# Patient Record
Sex: Female | Born: 1955 | Race: White | Hispanic: No | Marital: Married | Smoking: Never smoker
Health system: Southern US, Community
[De-identification: ages and names within clinical notes are randomized; demographics above are authoritative.]

## PROBLEM LIST (undated history)

## (undated) DIAGNOSIS — M8580 Other specified disorders of bone density and structure, unspecified site: Secondary | ICD-10-CM

## (undated) DIAGNOSIS — F419 Anxiety disorder, unspecified: Secondary | ICD-10-CM

## (undated) DIAGNOSIS — I10 Essential (primary) hypertension: Secondary | ICD-10-CM

## (undated) DIAGNOSIS — I447 Left bundle-branch block, unspecified: Secondary | ICD-10-CM

## (undated) DIAGNOSIS — E785 Hyperlipidemia, unspecified: Secondary | ICD-10-CM

## (undated) DIAGNOSIS — H269 Unspecified cataract: Secondary | ICD-10-CM

## (undated) DIAGNOSIS — N8500 Endometrial hyperplasia, unspecified: Secondary | ICD-10-CM

## (undated) DIAGNOSIS — K219 Gastro-esophageal reflux disease without esophagitis: Secondary | ICD-10-CM

## (undated) DIAGNOSIS — M1990 Unspecified osteoarthritis, unspecified site: Secondary | ICD-10-CM

## (undated) DIAGNOSIS — F32A Depression, unspecified: Secondary | ICD-10-CM

## (undated) DIAGNOSIS — C439 Malignant melanoma of skin, unspecified: Secondary | ICD-10-CM

## (undated) HISTORY — DX: Left bundle-branch block, unspecified: I44.7

## (undated) HISTORY — DX: Other specified disorders of bone density and structure, unspecified site: M85.80

## (undated) HISTORY — DX: Endometrial hyperplasia, unspecified: N85.00

## (undated) HISTORY — DX: Unspecified cataract: H26.9

## (undated) HISTORY — DX: Gastro-esophageal reflux disease without esophagitis: K21.9

## (undated) HISTORY — DX: Malignant melanoma of skin, unspecified: C43.9

## (undated) HISTORY — PX: MELANOMA EXCISION: SHX5266

## (undated) HISTORY — PX: COLONOSCOPY: SHX174

## (undated) HISTORY — PX: PARTIAL HYSTERECTOMY: SHX80

## (undated) HISTORY — DX: Depression, unspecified: F32.A

## (undated) HISTORY — DX: Anxiety disorder, unspecified: F41.9

## (undated) HISTORY — PX: POLYPECTOMY: SHX149

## (undated) HISTORY — PX: TONSILLECTOMY: SUR1361

## (undated) HISTORY — DX: Essential (primary) hypertension: I10

## (undated) HISTORY — DX: Unspecified osteoarthritis, unspecified site: M19.90

## (undated) HISTORY — DX: Hyperlipidemia, unspecified: E78.5

---

## 2002-07-11 ENCOUNTER — Emergency Department (HOSPITAL_COMMUNITY): Admission: EM | Admit: 2002-07-11 | Discharge: 2002-07-11 | Payer: Self-pay | Admitting: Emergency Medicine

## 2003-07-04 ENCOUNTER — Other Ambulatory Visit: Admission: RE | Admit: 2003-07-04 | Discharge: 2003-07-04 | Payer: Self-pay | Admitting: Obstetrics and Gynecology

## 2005-07-29 ENCOUNTER — Ambulatory Visit (HOSPITAL_COMMUNITY): Admission: RE | Admit: 2005-07-29 | Discharge: 2005-07-29 | Payer: Self-pay | Admitting: Internal Medicine

## 2005-07-29 LAB — HM DEXA SCAN

## 2010-10-21 DIAGNOSIS — C439 Malignant melanoma of skin, unspecified: Secondary | ICD-10-CM

## 2010-10-21 HISTORY — DX: Malignant melanoma of skin, unspecified: C43.9

## 2012-10-23 ENCOUNTER — Encounter: Payer: Self-pay | Admitting: Gastroenterology

## 2013-07-28 LAB — HM MAMMOGRAPHY: HM Mammogram: NEGATIVE

## 2013-08-30 ENCOUNTER — Encounter: Payer: Self-pay | Admitting: Internal Medicine

## 2013-08-31 ENCOUNTER — Ambulatory Visit: Payer: Private Health Insurance - Indemnity | Admitting: Emergency Medicine

## 2013-08-31 ENCOUNTER — Encounter: Payer: Self-pay | Admitting: Emergency Medicine

## 2013-08-31 VITALS — BP 124/88 | HR 62 | Temp 98.2°F | Resp 18 | Ht 64.5 in | Wt 158.0 lb

## 2013-08-31 DIAGNOSIS — Z0000 Encounter for general adult medical examination without abnormal findings: Secondary | ICD-10-CM

## 2013-08-31 DIAGNOSIS — R0602 Shortness of breath: Secondary | ICD-10-CM

## 2013-08-31 DIAGNOSIS — M549 Dorsalgia, unspecified: Secondary | ICD-10-CM

## 2013-08-31 DIAGNOSIS — F411 Generalized anxiety disorder: Secondary | ICD-10-CM

## 2013-08-31 DIAGNOSIS — I10 Essential (primary) hypertension: Secondary | ICD-10-CM

## 2013-08-31 LAB — LIPID PANEL
Cholesterol: 180 mg/dL (ref 0–200)
HDL: 83 mg/dL (ref >39)
LDL Cholesterol: 84 mg/dL (ref 0–99)
Total CHOL/HDL Ratio: 2.2 Ratio
Triglycerides: 63 mg/dL (ref <150)
VLDL: 13 mg/dL (ref 0–40)

## 2013-08-31 LAB — CBC WITH DIFFERENTIAL/PLATELET
Basophils Absolute: 0 10*3/uL (ref 0.0–0.1)
Basophils Relative: 0 % (ref 0–1)
Eosinophils Absolute: 0.1 10*3/uL (ref 0.0–0.7)
Eosinophils Relative: 2 % (ref 0–5)
HCT: 46.5 % — ABNORMAL HIGH (ref 36.0–46.0)
Hemoglobin: 16.1 g/dL — ABNORMAL HIGH (ref 12.0–15.0)
Lymphocytes Relative: 32 % (ref 12–46)
Lymphs Abs: 1.9 10*3/uL (ref 0.7–4.0)
MCH: 30 pg (ref 26.0–34.0)
MCHC: 34.6 g/dL (ref 30.0–36.0)
MCV: 86.8 fL (ref 78.0–100.0)
Monocytes Absolute: 0.5 10*3/uL (ref 0.1–1.0)
Monocytes Relative: 8 % (ref 3–12)
Neutro Abs: 3.4 10*3/uL (ref 1.7–7.7)
Neutrophils Relative %: 58 % (ref 43–77)
Platelets: 320 10*3/uL (ref 150–400)
RBC: 5.36 MIL/uL — ABNORMAL HIGH (ref 3.87–5.11)
RDW: 13.2 % (ref 11.5–15.5)
WBC: 5.9 10*3/uL (ref 4.0–10.5)

## 2013-08-31 LAB — HEPATIC FUNCTION PANEL
ALT: 22 U/L (ref 0–35)
AST: 18 U/L (ref 0–37)
Albumin: 4.2 g/dL (ref 3.5–5.2)
Alkaline Phosphatase: 75 U/L (ref 39–117)
Bilirubin, Direct: 0.1 mg/dL (ref 0.0–0.3)
Indirect Bilirubin: 0.5 mg/dL (ref 0.0–0.9)
Total Bilirubin: 0.6 mg/dL (ref 0.3–1.2)
Total Protein: 6.8 g/dL (ref 6.0–8.3)

## 2013-08-31 LAB — HEMOGLOBIN A1C
Hgb A1c MFr Bld: 5.4 % (ref <5.7)
Mean Plasma Glucose: 108 mg/dL (ref <117)

## 2013-08-31 LAB — BASIC METABOLIC PANEL WITH GFR
BUN: 13 mg/dL (ref 6–23)
CO2: 31 mEq/L (ref 19–32)
Calcium: 9.7 mg/dL (ref 8.4–10.5)
Chloride: 105 mEq/L (ref 96–112)
Creat: 0.69 mg/dL (ref 0.50–1.10)
GFR, Est African American: >89 mL/min
GFR, Est Non African American: >89 mL/min
Glucose, Bld: 88 mg/dL (ref 70–99)
Potassium: 3.9 mEq/L (ref 3.5–5.3)
Sodium: 141 mEq/L (ref 135–145)

## 2013-08-31 LAB — MAGNESIUM: Magnesium: 2.1 mg/dL (ref 1.5–2.5)

## 2013-08-31 LAB — TSH: TSH: 0.965 u[IU]/mL (ref 0.350–4.500)

## 2013-08-31 MED ORDER — IBUPROFEN 800 MG PO TABS: 800.0000 mg | ORAL_TABLET | Freq: Three times a day (TID) | ORAL | Status: DC | PRN

## 2013-08-31 MED ORDER — CYCLOBENZAPRINE HCL 5 MG PO TABS: 5.0000 mg | ORAL_TABLET | Freq: Three times a day (TID) | ORAL | Status: DC | PRN

## 2013-08-31 NOTE — Patient Instructions (Signed)
Shoulder Pain The shoulder is the joint that connects your arm to your body. Muscles and band-like tissues that connect bones to muscles (tendons) hold the joint together. Shoulder pain is felt if an injury or medical problem affects one or more parts of the shoulder. HOME CARE   Put ice on the sore area.  Put ice in a plastic bag.  Place a towel between your skin and the bag.  Leave the ice on for 15-20 minutes, 03-04 times a day for the first 2 days.  Stop using cold packs if they do not help with the pain.  If you were given something to keep your shoulder from moving (sling, shoulder immobilizer), wear it as told. Only take it off to shower or bathe.  Move your arm as little as possible, but keep your hand moving to prevent puffiness (swelling).  Squeeze a soft ball or foam pad as much as possible to help prevent swelling.  Take medicine as told by your doctor. GET HELP RIGHT AWAY IF:   Your arm, hand, or fingers are numb or tingling.  Your arm, hand, or fingers are puffy (swollen), painful, or turn white or blue.  You have more pain.  You have progressing new pain in your arm, hand, or fingers.  Your hand or fingers get cold.  Your medicine does not help lessen your pain. MAKE SURE YOU:   Understand these instructions.  Will watch your condition.  Will get help right away if you are not doing well or get worse. Document Released: 03/25/2008 Document Revised: 07/01/2012 Document Reviewed: 04/20/2012 [facility] Patient Information 2014 [place], [place]. Back Pain, Adult Back pain is very common. The pain often gets better over time. The cause of back pain is usually not dangerous. Most people can learn to manage their back pain on their own.  HOME CARE   Stay active. Start with short walks on flat ground if you can. Try to walk farther each day.  Do not sit, drive, or stand in one place for more than 30 minutes. Do not stay in bed.  Do not avoid exercise or work.  Activity can help your back heal faster.  Be careful when you bend or lift an object. Bend at your knees, keep the object close to you, and do not twist.  Sleep on a firm mattress. Lie on your side, and bend your knees. If you lie on your back, put a pillow under your knees.  Only take medicines as told by your doctor.  Put ice on the injured area.  Put ice in a plastic bag.  Place a towel between your skin and the bag.  Leave the ice on for 15-20 minutes, 03-04 times a day for the first 2 to 3 days. After that, you can switch between ice and heat packs.  Ask your doctor about back exercises or massage.  Avoid feeling anxious or stressed. Find good ways to deal with stress, such as exercise. GET HELP RIGHT AWAY IF:   Your pain does not go away with rest or medicine.  Your pain does not go away in 1 week.  You have new problems.  You do not feel well.  The pain spreads into your legs.  You cannot control when you poop (bowel movement) or pee (urinate).  Your arms or legs feel weak or lose feeling (numbness).  You feel sick to your stomach (nauseous) or throw up (vomit).  You have belly (abdominal) pain.  You feel like you  may pass out (faint). MAKE SURE YOU:   Understand these instructions.  Will watch your condition.  Will get help right away if you are not doing well or get worse. Document Released: 03/25/2008 Document Revised: 12/30/2011 Document Reviewed: 02/25/2011 [facility] Patient Information 2014 [place], [place].

## 2013-09-01 ENCOUNTER — Ambulatory Visit (HOSPITAL_COMMUNITY)
Admission: RE | Admit: 2013-09-01 | Discharge: 2013-09-01 | Disposition: A | Discharge status: Home / Self Care | Payer: Private Health Insurance - Indemnity | Source: Ambulatory Visit | Attending: Emergency Medicine | Admitting: Emergency Medicine

## 2013-09-01 DIAGNOSIS — M5144 Schmorl's nodes, thoracic region: Secondary | ICD-10-CM | POA: Insufficient documentation

## 2013-09-01 DIAGNOSIS — I10 Essential (primary) hypertension: Secondary | ICD-10-CM | POA: Insufficient documentation

## 2013-09-01 DIAGNOSIS — M545 Low back pain, unspecified: Secondary | ICD-10-CM | POA: Insufficient documentation

## 2013-09-01 DIAGNOSIS — R0602 Shortness of breath: Secondary | ICD-10-CM

## 2013-09-01 DIAGNOSIS — M549 Dorsalgia, unspecified: Secondary | ICD-10-CM

## 2013-09-01 DIAGNOSIS — R0989 Other specified symptoms and signs involving the circulatory and respiratory systems: Secondary | ICD-10-CM | POA: Insufficient documentation

## 2013-09-01 DIAGNOSIS — R0609 Other forms of dyspnea: Secondary | ICD-10-CM | POA: Insufficient documentation

## 2013-09-01 LAB — URINALYSIS, ROUTINE W REFLEX MICROSCOPIC
Bilirubin Urine: NEGATIVE
Glucose, UA: NEGATIVE mg/dL
Hgb urine dipstick: NEGATIVE
Ketones, ur: NEGATIVE mg/dL
Leukocytes, UA: NEGATIVE
Nitrite: NEGATIVE
Protein, ur: NEGATIVE mg/dL
Specific Gravity, Urine: 1.023 (ref 1.005–1.030)
Urobilinogen, UA: 0.2 mg/dL (ref 0.0–1.0)
pH: 5 (ref 5.0–8.0)

## 2013-09-01 LAB — MICROALBUMIN / CREATININE URINE RATIO
Creatinine, Urine: 213.3 mg/dL
Microalb Creat Ratio: 4.7 mg/g (ref 0.0–30.0)
Microalb, Ur: 1 mg/dL (ref 0.00–1.89)

## 2013-09-01 LAB — VITAMIN D 25 HYDROXY (VIT D DEFICIENCY, FRACTURES): Vit D, 25-Hydroxy: 51 ng/mL (ref 30–89)

## 2013-09-01 LAB — INSULIN, FASTING: Insulin fasting, serum: 11 u[IU]/mL (ref 3–28)

## 2013-09-01 NOTE — Addendum Note (Signed)
Addended by: Loree Fee R on: 09/01/2013 05:07 AM   Modules accepted: Orders

## 2013-09-01 NOTE — Progress Notes (Signed)
Subjective:    Patient ID: Maureen Henderson, female    DOB: [birth year], 57 y.o.   MRN: [id number]  HPI Comments: 57 yo female presents for yearly CPE with multiple complaints. She has not been exercising as much due to a back strain while doing water aerobics. She notes it has improved somewhat with rest and occasional Ibuprofen but still seems to get stiff and sore if sits for too long. Denies radiation of pain, notes pain on both sides of lower back. She has also noticed mild increased difficult with breathing when she is in chest high water, denies SOB any other time. She has been able to d/c Wellbutrin and maintain a low level of anxiety, she continues to see counselor regularly. She did not f/u AD for overdue colonoscopy but is willing to schedule in the spring. She did see DERM last year and they are continuing to monitor irregular moles. LAST LABS T 194 TG 85 H 75 LDL 102 D 42 A1C 5.4 MAG 2.3 TSH NL She notes BP has been good at home, she denies CP, Palpitations, edema, dizziness, SI, HI    Current Outpatient Prescriptions on File Prior to Visit  Medication Sig Dispense Refill  . cholecalciferol (VITAMIN D) 1000 UNITS tablet Take 1,000 Units by mouth every other day. Take two tablets daily.      . folic acid (FOLVITE) 400 MCG tablet Take 400 mcg by mouth daily.      . Multiple Vitamin (MULTIVITAMIN) tablet Take 1 tablet by mouth daily.      . propranolol (INDERAL) 10 MG tablet Take 10 mg by mouth daily.      . Red Yeast Rice Extract 600 MG TABS Take 600 mg by mouth. sporatic       No current facility-administered medications on file prior to visit.   ALLERGIES Doxycycline  Past Medical History  Diagnosis Date  . Hypertension   . Hyperlipidemia   . Anxiety    Past Surgical History  Procedure Laterality Date  . Partial hysterectomy  30    uterine polyps  . Tonsillectomy    . Melanoma excision      chest   SOCIAL HX - Tobacco or drug use.  + Wine 1 glass qd  Family History   Problem Relation Age of Onset  . Hypertension Mother   . Cancer Father   . Cancer Maternal Grandmother   . Heart disease Paternal Grandmother   . Stroke Paternal Grandmother   . Cancer Paternal Grandfather       Review of Systems  Musculoskeletal: Positive for back pain.  All other systems reviewed and are negative.   BP 124/88  Pulse 62  Temp(Src) 98.2 F (36.8 C) (Temporal)  Resp 18  Ht 5' 4.5" (1.638 m)  Wt 158 lb (71.668 kg)  BMI 26.71 kg/m2     Objective:   Physical Exam  Nursing note and vitals reviewed. Constitutional: She is oriented to person, place, and time. She appears well-developed and well-nourished.  Endocrine- Balan  HENT:  Head: Normocephalic and atraumatic.  Right Ear: External ear normal.  Left Ear: External ear normal.  Nose: Nose normal.  Mouth/Throat: Oropharynx is clear and moist.  Eyes: Conjunctivae and EOM are normal. Pupils are equal, round, and reactive to light.  Dr Azucena Freed yearly eval  Neck: Normal range of motion. Neck supple. No JVD present. No tracheal deviation present. No thyromegaly present.  Cardiovascular: Normal rate, regular rhythm, normal heart sounds and intact distal pulses.  Dr. Jacinto Halim  Pulmonary/Chest: Effort normal and breath sounds normal. She has no wheezes. She exhibits no tenderness.  Abdominal: Soft. Bowel sounds are normal.  Genitourinary:  Deferred to Dr. Cherly Hensen  Musculoskeletal: She exhibits tenderness.  + pain with ROM in Lumbar region. + SLR bilaterally  Lymphadenopathy:    She has no cervical adenopathy.  Neurological: She is alert and oriented to person, place, and time. She has normal reflexes. She displays normal reflexes. No cranial nerve deficit. She exhibits normal muscle tone. Coordination normal.  Skin: Skin is dry.  Full at Dr. Emily Filbert or skin Surgery Cen Low mid back dark 4mm flat irregelar border      EKG NSCSPT, LBBB    Assessment & Plan:  1. CPE check labs 2. Cholesterol/ HTN  continue same pending labs 3. Irreg Nevi- pt will schedule DERM appointment 4.SOB vs anxiety- CXR, continue counseling 5. Lumbar strain - Xray, heat, stretch, ice, w/c if symptoms progress. 6.Overdue colon will be scheduled.

## 2013-10-27 NOTE — Progress Notes (Signed)
lmom pt to call to schedule

## 2013-12-02 ENCOUNTER — Other Ambulatory Visit: Payer: Self-pay | Admitting: Emergency Medicine

## 2013-12-02 MED ORDER — PROPRANOLOL HCL 10 MG PO TABS: 10.0000 mg | ORAL_TABLET | Freq: Every day | ORAL | Status: DC

## 2014-01-05 ENCOUNTER — Encounter: Payer: Self-pay | Admitting: Internal Medicine

## 2014-01-13 ENCOUNTER — Ambulatory Visit (INDEPENDENT_AMBULATORY_CARE_PROVIDER_SITE_OTHER): Payer: Private Health Insurance - Indemnity | Admitting: Emergency Medicine

## 2014-01-13 ENCOUNTER — Encounter: Payer: Self-pay | Admitting: Emergency Medicine

## 2014-01-13 VITALS — BP 134/86 | HR 68 | Temp 98.1°F | Resp 16 | Wt 159.8 lb

## 2014-01-13 DIAGNOSIS — F411 Generalized anxiety disorder: Secondary | ICD-10-CM

## 2014-01-13 DIAGNOSIS — I10 Essential (primary) hypertension: Secondary | ICD-10-CM

## 2014-01-13 DIAGNOSIS — S60122A Contusion of left index finger with damage to nail, initial encounter: Secondary | ICD-10-CM

## 2014-01-13 DIAGNOSIS — E782 Mixed hyperlipidemia: Secondary | ICD-10-CM

## 2014-01-13 LAB — BASIC METABOLIC PANEL WITH GFR
BUN: 11 mg/dL (ref 6–23)
CO2: 25 mEq/L (ref 19–32)
Calcium: 9.6 mg/dL (ref 8.4–10.5)
Chloride: 102 mEq/L (ref 96–112)
Creat: 0.62 mg/dL (ref 0.50–1.10)
GFR, Est African American: >89 mL/min
GFR, Est Non African American: >89 mL/min
Glucose, Bld: 83 mg/dL (ref 70–99)
Potassium: 4 mEq/L (ref 3.5–5.3)
Sodium: 136 mEq/L (ref 135–145)

## 2014-01-13 LAB — LIPID PANEL
Cholesterol: 196 mg/dL (ref 0–200)
HDL: 73 mg/dL (ref >39)
LDL Cholesterol: 101 mg/dL — ABNORMAL HIGH (ref 0–99)
Total CHOL/HDL Ratio: 2.7 Ratio
Triglycerides: 112 mg/dL (ref <150)
VLDL: 22 mg/dL (ref 0–40)

## 2014-01-13 LAB — CBC WITH DIFFERENTIAL/PLATELET
Basophils Absolute: 0 10*3/uL (ref 0.0–0.1)
Basophils Relative: 0 % (ref 0–1)
Eosinophils Absolute: 0.1 10*3/uL (ref 0.0–0.7)
Eosinophils Relative: 1 % (ref 0–5)
HCT: 44 % (ref 36.0–46.0)
Hemoglobin: 15.1 g/dL — ABNORMAL HIGH (ref 12.0–15.0)
Lymphocytes Relative: 28 % (ref 12–46)
Lymphs Abs: 2.2 10*3/uL (ref 0.7–4.0)
MCH: 29.5 pg (ref 26.0–34.0)
MCHC: 34.3 g/dL (ref 30.0–36.0)
MCV: 86.1 fL (ref 78.0–100.0)
Monocytes Absolute: 0.5 10*3/uL (ref 0.1–1.0)
Monocytes Relative: 7 % (ref 3–12)
Neutro Abs: 4.9 10*3/uL (ref 1.7–7.7)
Neutrophils Relative %: 64 % (ref 43–77)
Platelets: 273 10*3/uL (ref 150–400)
RBC: 5.11 MIL/uL (ref 3.87–5.11)
RDW: 13.2 % (ref 11.5–15.5)
WBC: 7.7 10*3/uL (ref 4.0–10.5)

## 2014-01-13 LAB — HEPATIC FUNCTION PANEL
ALT: 26 U/L (ref 0–35)
AST: 19 U/L (ref 0–37)
Albumin: 4.1 g/dL (ref 3.5–5.2)
Alkaline Phosphatase: 65 U/L (ref 39–117)
Bilirubin, Direct: 0.1 mg/dL (ref 0.0–0.3)
Indirect Bilirubin: 0.5 mg/dL (ref 0.2–1.2)
Total Bilirubin: 0.6 mg/dL (ref 0.2–1.2)
Total Protein: 6.6 g/dL (ref 6.0–8.3)

## 2014-01-13 NOTE — Progress Notes (Signed)
Subjective:    Patient ID: Maureen Henderson, female    DOB: 11-20-1955, 58 y.o.   MRN: [id number]  HPI Comments: 58 yo female presents for 3 month F/U for HTN, Cholesterol, D. Deficient. She has not been checking BP. She is exercising. She is eating healthy. She is trying to lose weight and decrease OV.  Last labs CHOL         180   08/31/2013 HDL           83   08/31/2013 LDLCALC       84   08/31/2013 TRIG          63   08/31/2013 CHOLHDL      2.2   08/31/2013 ALT           22   08/31/2013 AST           18   08/31/2013 ALKPHOS       75   08/31/2013 BILITOT      0.6   08/31/2013 HGBA1C      5.4   08/31/2013 CREATININE     0.69   08/31/2013 BUN              13   08/31/2013 NA              141   08/31/2013 K               3.9   08/31/2013 CL              105   08/31/2013 CO2              31   08/31/2013 WBC      5.9   08/31/2013 HGB     16.1   08/31/2013 HCT     46.5   08/31/2013 MCV     86.8   08/31/2013 PLT      320   08/31/2013 She shut left index finger in door of car yesterday and notes initial pain/ edema/ bruising have improved somewhat. She denies numbness or tingling, she has iced her finger with mild relief.   Hand Pain       Medication List       This list is accurate as of: 01/13/14  1:48 PM.  Always use your most recent med list.               cholecalciferol 1000 UNITS tablet  Commonly known as:  VITAMIN D  Take 1,000 Units by mouth every other day. Take two tablets daily.     folic acid 741 MCG tablet  Commonly known as:  FOLVITE  Take 400 mcg by mouth daily.     multivitamin tablet  Take 1 tablet by mouth daily.     propranolol 10 MG tablet  Commonly known as:  INDERAL  Take 1 tablet (10 mg total) by mouth daily.     Red Yeast Rice Extract 600 MG Tabs  Take 600 mg by mouth. sporatic       Allergies  Allergen Reactions  . Doxycycline Hives   Past Medical History  Diagnosis Date  . Hypertension   . Hyperlipidemia   . Anxiety       Review of Systems  Skin: Positive for color change.  All other systems reviewed and are negative.   BP 134/86  Pulse 68  Temp(Src) 98.1 F (36.7 C)  Resp 16  Wt 159 lb 12.8 oz (72.485 kg)  Objective:   Physical Exam  Nursing note and vitals reviewed. Constitutional: She is oriented to person, place, and time. She appears well-developed and well-nourished. No distress.  HENT:  Head: Normocephalic and atraumatic.  Right Ear: External ear normal.  Left Ear: External ear normal.  Nose: Nose normal.  Mouth/Throat: Oropharynx is clear and moist.  Eyes: Conjunctivae and EOM are normal.  Neck: Normal range of motion. Neck supple. No JVD present. No thyromegaly present.  Cardiovascular: Normal rate, regular rhythm, normal heart sounds and intact distal pulses.   Pulmonary/Chest: Effort normal and breath sounds normal.  Abdominal: Soft. Bowel sounds are normal. She exhibits no distension and no mass. There is no tenderness. There is no rebound and no guarding.  Musculoskeletal: Normal range of motion. She exhibits no edema and no tenderness.  Good ROM with finger but mild discomfort  Lymphadenopathy:    She has no cervical adenopathy.  Neurological: She is alert and oriented to person, place, and time. No cranial nerve deficit.  Skin: Skin is warm and dry. No rash noted. No erythema. No pallor.  Right chest with 4 mm flat multicolored nevi Left index finger with edema/ purplish/ red tint from distal phalanx to tip.   Psychiatric: She has a normal mood and affect. Her behavior is normal. Judgment and thought content normal.          Assessment & Plan:  1.  3 month F/U for HTN, Cholesterol, D. Deficient. Needs healthy diet, cardio QD and obtain healthy weight. Check Labs, Check BP if >130/80 call office, patient wants to only come in 2 times a year for labs/ BP check due to cost/ time. She will send in monthly BP reports and schedule appointment with any concerns. Advise of  reasons for Q3 month visits but declines close f/u.  2. Left index finger contusion- Declines cauterization for evacuation of blood or XRAY, w/c if SX increase  3. ? Irreg NEVi- patient advised f/u DERM ASAP

## 2014-01-13 NOTE — Patient Instructions (Addendum)
Contusion A contusion is a deep bruise. Contusions happen when an injury causes bleeding under the skin. Signs of bruising include pain, puffiness (swelling), and discolored skin. The contusion may turn blue, purple, or yellow. HOME CARE   Put ice on the injured area.  Put ice in a plastic bag.  Place a towel between your skin and the bag.  Leave the ice on for 15-20 minutes, 03-04 times a day.  Only take medicine as told by your doctor.  Rest the injured area.  If possible, raise (elevate) the injured area to lessen puffiness. GET HELP RIGHT AWAY IF:   You have more bruising or puffiness.  You have pain that is getting worse.  Your puffiness or pain is not helped by medicine. MAKE SURE YOU:   Understand these instructions.  Will watch your condition.  Will get help right away if you are not doing well or get worse. Document Released: 03/25/2008 Document Revised: 12/30/2011 Document Reviewed: 08/12/2011 [facility] Patient Information 2014 [place], [place]. Hypertension Send in LIST of BP Hypertension is another name for high blood pressure. High blood pressure may mean that your heart needs to work harder to pump blood. Blood pressure consists of two numbers, which includes a higher number over a lower number (example: 110/72). HOME CARE   Make lifestyle changes as told by your doctor. This may include weight loss and exercise.  Take your blood pressure medicine every day.  Limit how much salt you use.  Stop smoking if you smoke.  Do not use drugs.  Talk to your doctor if you are using decongestants or birth control pills. These medicines might make blood pressure higher.  Females should not drink more than 1 alcoholic drink per day. Males should not drink more than 2 alcoholic drinks per day.  See your doctor as told. GET HELP RIGHT AWAY IF:   You have a blood pressure reading with a top number of 180 or higher.  You get a very bad headache.  You get blurred  or changing vision.  You feel confused.  You feel weak, numb, or faint.  You get chest or belly (abdominal) pain.  You throw up (vomit).  You cannot breathe very well. MAKE SURE YOU:   Understand these instructions.  Will watch your condition.  Will get help right away if you are not doing well or get worse. Document Released: 03/25/2008 Document Revised: 12/30/2011 Document Reviewed: 03/25/2008 [facility] Patient Information 2014 [place], [place].

## 2014-02-01 ENCOUNTER — Ambulatory Visit (AMBULATORY_SURGERY_CENTER): Payer: Self-pay

## 2014-02-01 VITALS — Ht 64.0 in | Wt 161.4 lb

## 2014-02-01 DIAGNOSIS — Z800 Family history of malignant neoplasm of digestive organs: Secondary | ICD-10-CM

## 2014-02-01 MED ORDER — MOVIPREP 100 G PO SOLR: ORAL | Status: DC

## 2014-02-10 ENCOUNTER — Encounter: Payer: Self-pay | Admitting: Internal Medicine

## 2014-02-16 ENCOUNTER — Encounter: Payer: Self-pay | Admitting: Internal Medicine

## 2014-02-16 ENCOUNTER — Ambulatory Visit (AMBULATORY_SURGERY_CENTER): Payer: Private Health Insurance - Indemnity | Admitting: Internal Medicine

## 2014-02-16 VITALS — BP 133/77 | HR 66 | Temp 97.0°F | Resp 17 | Ht 64.0 in | Wt 161.0 lb

## 2014-02-16 DIAGNOSIS — Z1211 Encounter for screening for malignant neoplasm of colon: Secondary | ICD-10-CM

## 2014-02-16 DIAGNOSIS — Z800 Family history of malignant neoplasm of digestive organs: Secondary | ICD-10-CM

## 2014-02-16 DIAGNOSIS — D126 Benign neoplasm of colon, unspecified: Secondary | ICD-10-CM

## 2014-02-16 MED ORDER — SODIUM CHLORIDE 0.9 % IV SOLN: 500.0000 mL | INTRAVENOUS | Status: DC

## 2014-02-16 NOTE — Progress Notes (Signed)
Report to pacu rn, vss, bbs=clear 

## 2014-02-16 NOTE — Op Note (Addendum)
[facility]  [address] [place], [address]   COLONOSCOPY PROCEDURE REPORT  PATIENT: Maureen, Henderson  MR#: [id number] BIRTHDATE: 1955-12-19 , 78  yrs. old GENDER: Female ENDOSCOPIST: Lafayette Dragon, MD REFERRED IR:JJOACZY Melford Aase, M.D. Dr Servando Salina PROCEDURE DATE:  02/16/2014 PROCEDURE:   Colonoscopy with hot biopsy/bipolar and Colonoscopy with snare polypectomy First Screening Colonoscopy - Avg.  risk and is 50 yrs.  old or older Yes.  Prior Negative Screening - Now for repeat screening. N/A  History of Adenoma - Now for follow-up colonoscopy & has been > or = to 3 yrs.  N/A  Polyps Removed Today? Yes. ASA CLASS:   Class I INDICATIONS:Patient's immediate family history of colon cancer. MEDICATIONS: MAC sedation, administered by CRNA and propofol (Diprivan) 400mg  IV  DESCRIPTION OF PROCEDURE:   After the risks benefits and alternatives of the procedure were thoroughly explained, informed consent was obtained.  A digital rectal exam revealed no abnormalities of the rectum.   The LB [id number]  endoscope was introduced through the anus and advanced to the cecum, which was identified by both the appendix and ileocecal valve. No adverse events experienced.   The quality of the prep was good, using MoviPrep  The instrument was then slowly withdrawn as the colon was fully examined.      COLON FINDINGS: A sessile polyp measuring 15 mm in size was found in the descending colon.it was a flat polyp which was partially removed with cold snare but the rest of it was ablated with the hot biopsies  A polypectomy was performed with a cold snare and using hot forceps.  The resection was complete and the polyp tissue was completely retrieved.  Retroflexed views revealed no abnormalities. The time to cecum=4 minutes 50 seconds.  Withdrawal time=17 minutes 46 seconds.  The scope was withdrawn and the procedure completed. COMPLICATIONS: There were no  complications.  ENDOSCOPIC IMPRESSION: Sessile polyp measuring 15 mm in size was found in the descending colon; polypectomy was performed with a cold snare and using hot forceps  RECOMMENDATIONS: 1.  Await pathology results 2.  high-fiber diet Recall colonoscopy pending path report   eSigned:  Lafayette Dragon, MD 02/16/2014 4:44 PM Revised: 02/16/2014 4:44 PM  cc:   PATIENT NAME:  Maureen Henderson, Maureen Henderson MR#: [id number]

## 2014-02-16 NOTE — Patient Instructions (Signed)
YOU HAD AN ENDOSCOPIC PROCEDURE TODAY AT THE [facility]: Refer to the procedure report that was given to you for any specific questions about what was found during the examination.  If the procedure report does not answer your questions, please call your gastroenterologist to clarify.  If you requested that your care partner not be given the details of your procedure findings, then the procedure report has been included in a sealed envelope for you to review at your convenience later.  YOU SHOULD EXPECT: Some feelings of bloating in the abdomen. Passage of more gas than usual.  Walking can help get rid of the air that was put into your GI tract during the procedure and reduce the bloating. If you had a lower endoscopy (such as a colonoscopy or flexible sigmoidoscopy) you may notice spotting of blood in your stool or on the toilet paper. If you underwent a bowel prep for your procedure, then you may not have a normal bowel movement for a few days.  DIET: Your first meal following the procedure should be a light meal and then it is ok to progress to your normal diet.  A half-sandwich or bowl of soup is an example of a good first meal.  Heavy or fried foods are harder to digest and may make you feel nauseous or bloated.  Likewise meals heavy in dairy and vegetables can cause extra gas to form and this can also increase the bloating.  Drink plenty of fluids but you should avoid alcoholic beverages for 24 hours.  ACTIVITY: Your care partner should take you home directly after the procedure.  You should plan to take it easy, moving slowly for the rest of the day.  You can resume normal activity the day after the procedure however you should NOT DRIVE or use heavy machinery for 24 hours (because of the sedation medicines used during the test).    SYMPTOMS TO REPORT IMMEDIATELY: A gastroenterologist can be reached at any hour.  During normal business hours, 8:30 AM to 5:00 PM Monday through Friday,  call [phone].  After hours and on weekends, please call the GI answering service at [phone] who will take a message and have the physician on call contact you.   Following lower endoscopy (colonoscopy or flexible sigmoidoscopy):  Excessive amounts of blood in the stool  Significant tenderness or worsening of abdominal pains  Swelling of the abdomen that is new, acute  Fever of 100F or higher    FOLLOW UP: If any biopsies were taken you will be contacted by phone or by letter within the next 1-3 weeks.  Call your gastroenterologist if you have not heard about the biopsies in 3 weeks.  Our staff will call the home number listed on your records the next business day following your procedure to check on you and address any questions or concerns that you may have at that time regarding the information given to you following your procedure. This is a courtesy call and so if there is no answer at the home number and we have not heard from you through the emergency physician on call, we will assume that you have returned to your regular daily activities without incident.  SIGNATURES/CONFIDENTIALITY: You and/or your care partner have signed paperwork which will be entered into your electronic medical record.  These signatures attest to the fact that that the information above on your After Visit Summary has been reviewed and is understood.  Full responsibility of the confidentiality   of this discharge information lies with you and/or your care-partner.   Polyp and high fiber diet information given.  Dr. Olevia Perches will advise you about recall colonoscopy after pathology reports are reviewed.

## 2014-02-16 NOTE — Progress Notes (Signed)
Called to room to assist during endoscopic procedure.  Patient ID and intended procedure confirmed with present staff. Received instructions for my participation in the procedure from the performing physician.  

## 2014-02-17 ENCOUNTER — Telehealth: Payer: Self-pay

## 2014-02-17 NOTE — Telephone Encounter (Signed)
  Follow up Call-  Call back number 02/16/2014  Post procedure Call Back phone  # [phone]  Permission to leave phone message Yes     Patient questions:  Do you have a fever, pain , or abdominal swelling? no Pain Score  0 *  Have you tolerated food without any problems? yes  Have you been able to return to your normal activities? yes  Do you have any questions about your discharge instructions: Diet   no Medications  no Follow up visit  no  Do you have questions or concerns about your Care? no  Actions: * If pain score is 4 or above: No action needed, pain <4.   No problems per the pt. maw

## 2014-02-23 ENCOUNTER — Encounter: Payer: Self-pay | Admitting: Internal Medicine

## 2014-02-24 ENCOUNTER — Encounter: Payer: Self-pay | Admitting: *Deleted

## 2014-03-02 ENCOUNTER — Encounter: Payer: Private Health Insurance - Indemnity | Admitting: Internal Medicine

## 2014-03-25 ENCOUNTER — Other Ambulatory Visit: Payer: Self-pay | Admitting: Dermatology

## 2014-04-13 ENCOUNTER — Other Ambulatory Visit: Payer: Self-pay | Admitting: Emergency Medicine

## 2014-04-13 MED ORDER — LISINOPRIL 10 MG PO TABS: 10.0000 mg | ORAL_TABLET | Freq: Every day | ORAL | Status: DC

## 2014-04-13 NOTE — Progress Notes (Signed)
Patient aware.  Will add Lisinopril Rx AD.  Fwd message to front to call patient and schedule 1 month OV.

## 2014-04-21 NOTE — Progress Notes (Signed)
Pt is saying she dosent want to schedule the 53mth was wanting to keep track of BP readings & will drop them off as necessary. Today BP was 115/70 feels meds ect is working. Pt said her husband was just laid off no income on [employer] ins & high deductible asking what you think & if you agree or accept what shes acking. She said if you say that it is mandatory or any other directions she would do what you said. I told pt if more directions I would let her know.

## 2014-04-28 ENCOUNTER — Other Ambulatory Visit: Payer: Self-pay | Admitting: Dermatology

## 2014-05-30 ENCOUNTER — Other Ambulatory Visit: Payer: Self-pay | Admitting: *Deleted

## 2014-05-30 MED ORDER — PROPRANOLOL HCL 10 MG PO TABS: 10.0000 mg | ORAL_TABLET | Freq: Every day | ORAL | Status: DC

## 2014-06-01 ENCOUNTER — Other Ambulatory Visit: Payer: Self-pay | Admitting: *Deleted

## 2014-06-01 MED ORDER — ESTRADIOL 0.5 MG PO TABS: 0.5000 mg | ORAL_TABLET | Freq: Every day | ORAL | Status: DC

## 2014-06-30 ENCOUNTER — Other Ambulatory Visit: Payer: Self-pay | Admitting: *Deleted

## 2014-06-30 MED ORDER — LISINOPRIL 10 MG PO TABS: 10.0000 mg | ORAL_TABLET | Freq: Every day | ORAL | Status: DC

## 2014-08-30 ENCOUNTER — Other Ambulatory Visit: Payer: Self-pay | Admitting: *Deleted

## 2014-08-30 MED ORDER — PROPRANOLOL HCL 10 MG PO TABS: 10.0000 mg | ORAL_TABLET | Freq: Every day | ORAL | Status: DC

## 2014-08-30 MED ORDER — LISINOPRIL 10 MG PO TABS: 10.0000 mg | ORAL_TABLET | Freq: Every day | ORAL | Status: DC

## 2014-08-31 ENCOUNTER — Telehealth: Payer: Self-pay | Admitting: Internal Medicine

## 2014-08-31 ENCOUNTER — Encounter: Payer: Self-pay | Admitting: Emergency Medicine

## 2014-09-30 ENCOUNTER — Ambulatory Visit (INDEPENDENT_AMBULATORY_CARE_PROVIDER_SITE_OTHER): Payer: Private Health Insurance - Indemnity | Admitting: Emergency Medicine

## 2014-09-30 ENCOUNTER — Encounter: Payer: Self-pay | Admitting: Emergency Medicine

## 2014-09-30 VITALS — BP 118/62 | HR 58 | Temp 98.0°F | Resp 16 | Ht 64.25 in | Wt 154.0 lb

## 2014-09-30 DIAGNOSIS — Z111 Encounter for screening for respiratory tuberculosis: Secondary | ICD-10-CM

## 2014-09-30 DIAGNOSIS — Z79899 Other long term (current) drug therapy: Secondary | ICD-10-CM

## 2014-09-30 DIAGNOSIS — R6889 Other general symptoms and signs: Secondary | ICD-10-CM

## 2014-09-30 DIAGNOSIS — R5383 Other fatigue: Secondary | ICD-10-CM

## 2014-09-30 DIAGNOSIS — I10 Essential (primary) hypertension: Secondary | ICD-10-CM

## 2014-09-30 DIAGNOSIS — Z0000 Encounter for general adult medical examination without abnormal findings: Secondary | ICD-10-CM

## 2014-09-30 DIAGNOSIS — F411 Generalized anxiety disorder: Secondary | ICD-10-CM

## 2014-09-30 DIAGNOSIS — E559 Vitamin D deficiency, unspecified: Secondary | ICD-10-CM

## 2014-09-30 DIAGNOSIS — Z0001 Encounter for general adult medical examination with abnormal findings: Secondary | ICD-10-CM

## 2014-09-30 DIAGNOSIS — R239 Unspecified skin changes: Secondary | ICD-10-CM

## 2014-09-30 DIAGNOSIS — C439 Malignant melanoma of skin, unspecified: Secondary | ICD-10-CM | POA: Insufficient documentation

## 2014-09-30 LAB — CBC WITH DIFFERENTIAL/PLATELET
Basophils Absolute: 0 10*3/uL (ref 0.0–0.1)
Basophils Relative: 0 % (ref 0–1)
EOS ABS: 0.1 10*3/uL (ref 0.0–0.7)
Eosinophils Relative: 2 % (ref 0–5)
HEMATOCRIT: 46.6 % — AB (ref 36.0–46.0)
HEMOGLOBIN: 15.8 g/dL — AB (ref 12.0–15.0)
LYMPHS ABS: 2 10*3/uL (ref 0.7–4.0)
Lymphocytes Relative: 32 % (ref 12–46)
MCH: 30 pg (ref 26.0–34.0)
MCHC: 33.9 g/dL (ref 30.0–36.0)
MCV: 88.4 fL (ref 78.0–100.0)
MONO ABS: 0.5 10*3/uL (ref 0.1–1.0)
MONOS PCT: 8 % (ref 3–12)
MPV: 9.4 fL (ref 9.4–12.4)
NEUTROS PCT: 58 % (ref 43–77)
Neutro Abs: 3.7 10*3/uL (ref 1.7–7.7)
Platelets: 317 10*3/uL (ref 150–400)
RBC: 5.27 MIL/uL — ABNORMAL HIGH (ref 3.87–5.11)
RDW: 13.2 % (ref 11.5–15.5)
WBC: 6.3 10*3/uL (ref 4.0–10.5)

## 2014-09-30 MED ORDER — ESTRADIOL 0.5 MG PO TABS: 0.5000 mg | ORAL_TABLET | Freq: Every day | ORAL | Status: DC

## 2014-09-30 MED ORDER — PROPRANOLOL HCL 10 MG PO TABS: 10.0000 mg | ORAL_TABLET | Freq: Every day | ORAL | Status: DC

## 2014-09-30 MED ORDER — LISINOPRIL 10 MG PO TABS: 10.0000 mg | ORAL_TABLET | Freq: Every day | ORAL | Status: DC

## 2014-09-30 MED ORDER — ALPRAZOLAM 1 MG PO TABS: 1.0000 mg | ORAL_TABLET | Freq: Two times a day (BID) | ORAL | Status: DC | PRN

## 2014-09-30 NOTE — Progress Notes (Signed)
Subjective:    Patient ID: Maureen Henderson, female    DOB: 05-12-1956, 58 y.o.   MRN: [id number]  HPI Comments: 58 yo WF CPE and HTN f/u. She added Lisinopril to help with BP control. SHe has noted lower BP/ Pulse with added medication. She notes increased stress with work and family which makes her more fatigued vs low BP.   She has not been on Wellbutrin or xanax in a long time but feels she may need to refill Xanax with increased stress.  She is overdue for her mammogram but notes she did have dermatology evaluation AD. She notes DERM was NEG.  Lab Results      Component                Value               Date                      WBC                      7.7                 01/13/2014                HGB                      15.1*               01/13/2014                HCT                      44.0                01/13/2014                PLT                      273                 01/13/2014                GLUCOSE                  83                  01/13/2014                CHOL                     196                 01/13/2014                TRIG                     112                 01/13/2014                HDL                      73                  01/13/2014  LDLCALC                  101*                01/13/2014                ALT                      26                  01/13/2014                AST                      19                  01/13/2014                NA                       136                 01/13/2014                K                        4.0                 01/13/2014                CL                       102                 01/13/2014                CREATININE               0.62                01/13/2014                BUN                      11                  01/13/2014                CO2                      25                  01/13/2014                TSH                      0.965               08/31/2013                 HGBA1C                   5.4                 08/31/2013                MICROALBUR  1.00                08/31/2013               Medication List       This list is accurate as of: 09/30/14 11:59 PM.  Always use your most recent med list.               ACIDOPHILUS PO  Take by mouth as needed.     ALPRAZolam 1 MG tablet  Commonly known as:  XANAX  Take 1 tablet (1 mg total) by mouth 2 (two) times daily as needed for sleep.     cholecalciferol 1000 UNITS tablet  Commonly known as:  VITAMIN D  Take 5,000 Units by mouth. Takes twice a month     estradiol 0.5 MG tablet  Commonly known as:  ESTRACE  Take 1 tablet (0.5 mg total) by mouth daily.     folic acid 322 MCG tablet  Commonly known as:  FOLVITE  Take 400 mcg by mouth. Not consistent     lisinopril 10 MG tablet  Commonly known as:  PRINIVIL,ZESTRIL  Take 1 tablet (10 mg total) by mouth daily.     multivitamin tablet  Take 1 tablet by mouth daily.     propranolol 10 MG tablet  Commonly known as:  INDERAL  Take 1 tablet (10 mg total) by mouth daily.     Red Yeast Rice Extract 600 MG Tabs  Take 600 mg by mouth. sporatic       Allergies  Allergen Reactions  . Doxycycline Hives   Past Medical History  Diagnosis Date  . Hypertension   . Hyperlipidemia   . Anxiety   . Bundle, branch block, left   . Melanoma 2012    chest   Past Surgical History  Procedure Laterality Date  . Partial hysterectomy  30    uterine polyps  . Tonsillectomy    . Melanoma excision      chest   History  Substance Use Topics  . Smoking status: Never Smoker   . Smokeless tobacco: Never Used  . Alcohol Use: 4.2 oz/week    7 Glasses of wine per week   Family History  Problem Relation Age of Onset  . Hypertension Mother   . Cancer Father   . Prostate cancer Father   . Cancer Maternal Grandmother   . Heart disease Paternal Grandmother   . Stroke Paternal Grandmother   . Cancer Paternal Grandfather   . Colon  cancer Maternal Aunt     MAINTENANCE: Colonoscopy:02/16/14 polyp due 2018 Mammo:08/07/13 overdue GUR:4270 osteopenia  Pap/ Pelvic:due 2016 WCB:JSEGBTD  Dentist:q 6 month Echo:EF55-60%  IMMUNIZATIONS: VVOH:6073 Pneumovax:declines Zostavax:n/a Influenza:09/18/14  Patient Care Team: Unk Pinto, MD as PCP - General (Internal Medicine) Marvene Staff, MD as Consulting Physician (Obstetrics and Gynecology) Laverda Page, MD as Consulting Physician (Cardiology) Lafayette Dragon, MD as Consulting Physician (Gastroenterology) Jari Pigg, MD as Consulting Physician (Dermatology) Jacelyn Pi, MD as Consulting Physician (Endocrinology) [facility]   Review of Systems  Constitutional: Positive for fatigue.  Psychiatric/Behavioral: The patient is nervous/anxious.   All other systems reviewed and are negative.  BP 118/62 mmHg  Pulse 58  Temp(Src) 98 F (36.7 C) (Temporal)  Resp 16  Ht 5' 4.25" (1.632 m)  Wt 154 lb (69.854 kg)  BMI 26.23 kg/m2     Objective:   Physical Exam  Constitutional: She is oriented to person, place, and time. She  appears well-developed and well-nourished. No distress.  HENT:  Head: Normocephalic and atraumatic.  Right Ear: External ear normal.  Left Ear: External ear normal.  Nose: Nose normal.  Mouth/Throat: Oropharynx is clear and moist.  Eyes: Conjunctivae and EOM are normal. Pupils are equal, round, and reactive to light. Right eye exhibits no discharge. Left eye exhibits no discharge. No scleral icterus.  Neck: Normal range of motion. Neck supple. No JVD present. No tracheal deviation present. No thyromegaly present.  Cardiovascular: Normal rate, regular rhythm, normal heart sounds and intact distal pulses.   Pulmonary/Chest: Effort normal and breath sounds normal.  Abdominal: Soft. Bowel sounds are normal. She exhibits no distension and no mass. There is no tenderness. There is no rebound and no guarding.  Genitourinary:   Def gyn  Musculoskeletal: Normal range of motion. She exhibits no edema or tenderness.  Lymphadenopathy:    She has no cervical adenopathy.  Neurological: She is alert and oriented to person, place, and time. She has normal reflexes. No cranial nerve deficit. She exhibits normal muscle tone. Coordination normal.  Skin: Skin is warm and dry. No rash noted. No erythema. No pallor.  Mid abdomen dark flat 2 mm  Psychiatric: She has a normal mood and affect. Her behavior is normal. Judgment and thought content normal.  Nursing note and vitals reviewed.     AORTA SCAN WNL EKG NSCSPT     Assessment & Plan:  1. CPE- Update screening labs/ History/ Immunizations/ Testing as needed. Advised healthy diet, QD exercise, increase H20 and continue RX/ Vitamins AD.  2. Fatigue- check labs, increase activity and H2O vs may be low pulse, decrease Propranolol and Lisinopril to 1/2. Check BP/ Pulse and call if become elevated above 130/90 or 100 pulse  3. Anxiety/ stress- Advised take 1/2 of 1 mg XANAX up to BID if taking BID needs to follow up for re-evaluation and medication change  4.  Irreg Nevi- monitor for any change, call for removal

## 2014-09-30 NOTE — Patient Instructions (Signed)

## 2014-10-01 LAB — LIPID PANEL
CHOLESTEROL: 206 mg/dL — AB (ref 0–200)
HDL: 89 mg/dL (ref >39)
LDL Cholesterol: 94 mg/dL (ref 0–99)
TRIGLYCERIDES: 114 mg/dL (ref <150)
Total CHOL/HDL Ratio: 2.3 Ratio
VLDL: 23 mg/dL (ref 0–40)

## 2014-10-01 LAB — URINALYSIS, ROUTINE W REFLEX MICROSCOPIC
Bilirubin Urine: NEGATIVE
Glucose, UA: NEGATIVE mg/dL
Hgb urine dipstick: NEGATIVE
Ketones, ur: NEGATIVE mg/dL
LEUKOCYTES UA: NEGATIVE
NITRITE: NEGATIVE
PH: 5 (ref 5.0–8.0)
PROTEIN: NEGATIVE mg/dL
Specific Gravity, Urine: 1.011 (ref 1.005–1.030)
Urobilinogen, UA: 0.2 mg/dL (ref 0.0–1.0)

## 2014-10-01 LAB — FOLATE RBC: RBC Folate: 681 ng/mL (ref >280)

## 2014-10-01 LAB — TSH: TSH: 1.055 u[IU]/mL (ref 0.350–4.500)

## 2014-10-01 LAB — BASIC METABOLIC PANEL WITHOUT GFR
BUN: 14 mg/dL (ref 6–23)
CO2: 26 meq/L (ref 19–32)
Calcium: 10.1 mg/dL (ref 8.4–10.5)
Chloride: 101 meq/L (ref 96–112)
Creat: 0.77 mg/dL (ref 0.50–1.10)
GFR, Est African American: >89 mL/min
GFR, Est Non African American: 85 mL/min
Glucose, Bld: 90 mg/dL (ref 70–99)
Potassium: 4.4 meq/L (ref 3.5–5.3)
Sodium: 141 meq/L (ref 135–145)

## 2014-10-01 LAB — VITAMIN D 25 HYDROXY (VIT D DEFICIENCY, FRACTURES): VIT D 25 HYDROXY: 31 ng/mL (ref 30–100)

## 2014-10-01 LAB — HEPATIC FUNCTION PANEL
ALT: 22 U/L (ref 0–35)
AST: 18 U/L (ref 0–37)
Albumin: 4.5 g/dL (ref 3.5–5.2)
Alkaline Phosphatase: 70 U/L (ref 39–117)
Bilirubin, Direct: 0.1 mg/dL (ref 0.0–0.3)
Indirect Bilirubin: 0.5 mg/dL (ref 0.2–1.2)
Total Bilirubin: 0.6 mg/dL (ref 0.2–1.2)
Total Protein: 7.1 g/dL (ref 6.0–8.3)

## 2014-10-01 LAB — HEMOGLOBIN A1C
Hgb A1c MFr Bld: 5.5 % (ref <5.7)
MEAN PLASMA GLUCOSE: 111 mg/dL (ref <117)

## 2014-10-01 LAB — IRON AND TIBC
%SAT: 45 % (ref 20–55)
Iron: 157 ug/dL — ABNORMAL HIGH (ref 42–145)
TIBC: 351 ug/dL (ref 250–470)
UIBC: 194 ug/dL (ref 125–400)

## 2014-10-01 LAB — MICROALBUMIN / CREATININE URINE RATIO
Creatinine, Urine: 54.7 mg/dL
Microalb, Ur: <0.2 mg/dL (ref <2.0)

## 2014-10-01 LAB — MAGNESIUM: Magnesium: 2.3 mg/dL (ref 1.5–2.5)

## 2014-10-01 LAB — VITAMIN B12: VITAMIN B 12: 425 pg/mL (ref 211–911)

## 2014-10-01 LAB — INSULIN, FASTING: Insulin fasting, serum: 5.3 u[IU]/mL (ref 2.0–19.6)

## 2014-10-03 LAB — TB SKIN TEST
INDURATION: 0 mm
TB Skin Test: NEGATIVE

## 2014-10-11 NOTE — Telephone Encounter (Signed)
ERROR

## 2015-03-04 ENCOUNTER — Encounter: Payer: Self-pay | Admitting: *Deleted

## 2015-03-07 ENCOUNTER — Ambulatory Visit (INDEPENDENT_AMBULATORY_CARE_PROVIDER_SITE_OTHER): Payer: BLUE CROSS/BLUE SHIELD | Admitting: Psychology

## 2015-03-07 DIAGNOSIS — F4321 Adjustment disorder with depressed mood: Secondary | ICD-10-CM | POA: Diagnosis not present

## 2015-03-28 ENCOUNTER — Ambulatory Visit: Payer: BLUE CROSS/BLUE SHIELD | Admitting: Psychology

## 2015-06-08 ENCOUNTER — Ambulatory Visit: Payer: Self-pay | Admitting: Physician Assistant

## 2015-06-15 ENCOUNTER — Ambulatory Visit (INDEPENDENT_AMBULATORY_CARE_PROVIDER_SITE_OTHER): Payer: BLUE CROSS/BLUE SHIELD | Admitting: Physician Assistant

## 2015-06-15 ENCOUNTER — Encounter: Payer: Self-pay | Admitting: Physician Assistant

## 2015-06-15 VITALS — BP 124/76 | HR 68 | Temp 98.6°F | Resp 16 | Ht 64.0 in | Wt 154.2 lb

## 2015-06-15 DIAGNOSIS — E782 Mixed hyperlipidemia: Secondary | ICD-10-CM | POA: Diagnosis not present

## 2015-06-15 DIAGNOSIS — E559 Vitamin D deficiency, unspecified: Secondary | ICD-10-CM

## 2015-06-15 DIAGNOSIS — I10 Essential (primary) hypertension: Secondary | ICD-10-CM

## 2015-06-15 DIAGNOSIS — F411 Generalized anxiety disorder: Secondary | ICD-10-CM | POA: Diagnosis not present

## 2015-06-15 DIAGNOSIS — Z79899 Other long term (current) drug therapy: Secondary | ICD-10-CM

## 2015-06-15 DIAGNOSIS — C439 Malignant melanoma of skin, unspecified: Secondary | ICD-10-CM | POA: Diagnosis not present

## 2015-06-15 LAB — CBC WITH DIFFERENTIAL/PLATELET
BASOS PCT: 0 % (ref 0–1)
Basophils Absolute: 0 10*3/uL (ref 0.0–0.1)
Eosinophils Absolute: 0.1 10*3/uL (ref 0.0–0.7)
Eosinophils Relative: 1 % (ref 0–5)
HCT: 45.9 % (ref 36.0–46.0)
HEMOGLOBIN: 15.7 g/dL — AB (ref 12.0–15.0)
LYMPHS ABS: 2 10*3/uL (ref 0.7–4.0)
Lymphocytes Relative: 33 % (ref 12–46)
MCH: 30.5 pg (ref 26.0–34.0)
MCHC: 34.2 g/dL (ref 30.0–36.0)
MCV: 89.1 fL (ref 78.0–100.0)
MONOS PCT: 8 % (ref 3–12)
MPV: 9.2 fL (ref 8.6–12.4)
Monocytes Absolute: 0.5 10*3/uL (ref 0.1–1.0)
NEUTROS ABS: 3.6 10*3/uL (ref 1.7–7.7)
NEUTROS PCT: 58 % (ref 43–77)
Platelets: 278 10*3/uL (ref 150–400)
RBC: 5.15 MIL/uL — ABNORMAL HIGH (ref 3.87–5.11)
RDW: 13.2 % (ref 11.5–15.5)
WBC: 6.2 10*3/uL (ref 4.0–10.5)

## 2015-06-15 LAB — HEPATIC FUNCTION PANEL
ALT: 19 U/L (ref 6–29)
AST: 17 U/L (ref 10–35)
Albumin: 4.1 g/dL (ref 3.6–5.1)
Alkaline Phosphatase: 61 U/L (ref 33–130)
BILIRUBIN DIRECT: 0.1 mg/dL (ref <=0.2)
BILIRUBIN INDIRECT: 0.4 mg/dL (ref 0.2–1.2)
BILIRUBIN TOTAL: 0.5 mg/dL (ref 0.2–1.2)
Total Protein: 6.4 g/dL (ref 6.1–8.1)

## 2015-06-15 LAB — BASIC METABOLIC PANEL WITH GFR
BUN: 9 mg/dL (ref 7–25)
CHLORIDE: 104 mmol/L (ref 98–110)
CO2: 26 mmol/L (ref 20–31)
Calcium: 9.3 mg/dL (ref 8.6–10.4)
Creat: 0.72 mg/dL (ref 0.50–1.05)
GFR, Est African American: >89 mL/min (ref >=60)
GFR, Est Non African American: >89 mL/min (ref >=60)
Glucose, Bld: 76 mg/dL (ref 65–99)
Potassium: 4.6 mmol/L (ref 3.5–5.3)
SODIUM: 140 mmol/L (ref 135–146)

## 2015-06-15 LAB — LIPID PANEL
CHOL/HDL RATIO: 2.1 ratio (ref <=5.0)
CHOLESTEROL: 190 mg/dL (ref 125–200)
HDL: 90 mg/dL (ref >=46)
LDL Cholesterol: 83 mg/dL (ref <130)
Triglycerides: 86 mg/dL (ref <150)
VLDL: 17 mg/dL (ref <30)

## 2015-06-15 LAB — TSH: TSH: 0.865 u[IU]/mL (ref 0.350–4.500)

## 2015-06-15 LAB — MAGNESIUM: MAGNESIUM: 2.3 mg/dL (ref 1.5–2.5)

## 2015-06-15 MED ORDER — BISOPROLOL-HYDROCHLOROTHIAZIDE 5-6.25 MG PO TABS: 1.0000 | ORAL_TABLET | Freq: Every day | ORAL | Status: DC

## 2015-06-15 MED ORDER — ALPRAZOLAM 1 MG PO TABS
1.0000 mg | ORAL_TABLET | Freq: Two times a day (BID) | ORAL | Status: DC | PRN
Start: 2015-06-15 — End: 2015-11-20

## 2015-06-15 MED ORDER — ASPIRIN EC 81 MG PO TBEC: 81.0000 mg | DELAYED_RELEASE_TABLET | Freq: Every day | ORAL | Status: AC

## 2015-06-15 MED ORDER — ESTRADIOL 0.5 MG PO TABS: 0.5000 mg | ORAL_TABLET | Freq: Every day | ORAL | Status: DC

## 2015-06-15 NOTE — Progress Notes (Signed)
Assessment and Plan:  1. Hypertension -will switch from propanolol and lisinipril to ziac, monitor blood pressure at home. Continue DASH diet.  Reminder to go to the ER if any CP, SOB, nausea, dizziness, severe HA, changes vision/speech, left arm numbness and tingling and jaw pain.  2. Cholesterol -Continue diet and exercise. Check cholesterol.   3. Anxiety Is doing well with anxiety off wellbutrin, continue xanax PRN  4. Vitamin D Def - check level and continue medications.   Continue diet and meds as discussed. Further disposition pending results of labs. Over 30 minutes of exam, counseling, chart review, and critical decision making was performed  HPI 59 y.o. female  presents for 3 month follow up on hypertension, cholesterol, prediabetes, and vitamin D deficiency.   Her blood pressure has been controlled at home, she is on lisinopril and propanolol, today their BP is BP: 124/76 mmHg, rechecked at 130/86. She feels the lisinopril is causing her hair to fall out and is causing intolerance to alcohol.   She does workout. She denies chest pain, shortness of breath, dizziness.  She is not on cholesterol medication and denies myalgias. Her cholesterol is at goal. The cholesterol last visit was:   Lab Results  Component Value Date   CHOL 206* 09/30/2014   HDL 89 09/30/2014   LDLCALC 94 09/30/2014   TRIG 114 09/30/2014   CHOLHDL 2.3 09/30/2014   Last A1C in the office was:  Lab Results  Component Value Date   HGBA1C 5.5 09/30/2014   Patient is on Vitamin D supplement.   Lab Results  Component Value Date   VD25OH 31 09/30/2014     She has history of anxiety, was on wellbutrin in the past, takes xanax very rarely.   She is on estrogen due to menopausal sympome from her OB/GYN.   Current Medications:  Current Outpatient Prescriptions on File Prior to Visit  Medication Sig Dispense Refill  . ALPRAZolam (XANAX) 1 MG tablet Take 1 tablet (1 mg total) by mouth 2 (two) times daily as  needed for sleep. 60 tablet 0  . cholecalciferol (VITAMIN D) 1000 UNITS tablet Take 5,000 Units by mouth. Takes twice a month    . estradiol (ESTRACE) 0.5 MG tablet Take 1 tablet (0.5 mg total) by mouth daily. 90 tablet 0  . folic acid (FOLVITE) 175 MCG tablet Take 400 mcg by mouth. Not consistent    . Lactobacillus (ACIDOPHILUS PO) Take by mouth as needed.    Marland Kitchen lisinopril (PRINIVIL,ZESTRIL) 10 MG tablet Take 1 tablet (10 mg total) by mouth daily. 90 tablet 1  . Multiple Vitamin (MULTIVITAMIN) tablet Take 1 tablet by mouth daily.    . propranolol (INDERAL) 10 MG tablet Take 1 tablet (10 mg total) by mouth daily. 90 tablet 1  . Red Yeast Rice Extract 600 MG TABS Take 600 mg by mouth. sporatic     No current facility-administered medications on file prior to visit.   Medical History:  Past Medical History  Diagnosis Date  . Hypertension   . Hyperlipidemia   . Anxiety   . Bundle, branch block, left   . Melanoma 2012    chest   Allergies:  Allergies  Allergen Reactions  . Doxycycline Hives    Review of Systems:  Review of Systems  Constitutional: Negative.   HENT: Negative.   Eyes: Negative.   Respiratory: Negative.   Cardiovascular: Negative.   Gastrointestinal: Negative.   Genitourinary: Negative.   Musculoskeletal: Negative.   Skin: Negative.  Neurological: Negative.   Endo/Heme/Allergies: Negative.   Psychiatric/Behavioral: Negative.     Family history- Review and unchanged Social history- Review and unchanged Physical Exam: BP 124/76 mmHg  Pulse 68  Temp(Src) 98.6 F (37 C)  Resp 16  Ht 5\' 4"  (1.626 m)  Wt 154 lb 3.2 oz (69.945 kg)  BMI 26.46 kg/m2 Wt Readings from Last 3 Encounters:  06/15/15 154 lb 3.2 oz (69.945 kg)  09/30/14 154 lb (69.854 kg)  02/16/14 161 lb (73.029 kg)   General Appearance: Well nourished, in no apparent distress. Eyes: PERRLA, EOMs, conjunctiva no swelling or erythema Sinuses: No Frontal/maxillary tenderness ENT/Mouth: Ext aud  canals clear, TMs without erythema, bulging. No erythema, swelling, or exudate on post pharynx.  Tonsils not swollen or erythematous. Hearing normal.  Neck: Supple, thyroid normal.  Respiratory: Respiratory effort normal, BS equal bilaterally without rales, rhonchi, wheezing or stridor.  Cardio: RRR with no MRGs. Brisk peripheral pulses without edema.  Abdomen: Soft, + BS,  Non tender, no guarding, rebound, hernias, masses. Lymphatics: Non tender without lymphadenopathy.  Musculoskeletal: Full ROM, 5/5 strength, Normal gait Skin: Warm, dry without rashes, lesions, ecchymosis.  Neuro: Cranial nerves intact. Normal muscle tone, no cerebellar symptoms. Psych: Awake and oriented X 3, normal affect, Insight and Judgment appropriate.    Vicie Mutters, PA-C 11:47 AM [facility]

## 2015-06-15 NOTE — Patient Instructions (Signed)
Stop lisinopril and propanolol  Start the ziac 5 mg start on 1/2 pill daily, likely you will have to go up to the whole pill after 1-3 days, can take at night.   Add ENTERIC COATED low dose 81 mg Aspirin daily OR can do every other day if you have easy bruising to protect your heart and head. As well as to reduce risk of Colon Cancer by 20 %, Skin Cancer by 26 % , Melanoma by 46% and Pancreatic cancer by 60%   Monitor your blood pressure at home, if it is above 140/90 consistently call the office so we can adjust your medications. Go to the ER if any CP, SOB, nausea, dizziness, severe HA, changes vision/speech  DASH Eating Plan DASH stands for "Dietary Approaches to Stop Hypertension." The DASH eating plan is a healthy eating plan that has been shown to reduce high blood pressure (hypertension). Additional health benefits may include reducing the risk of type 2 diabetes mellitus, heart disease, and stroke. The DASH eating plan may also help with weight loss. WHAT DO I NEED TO KNOW ABOUT THE DASH EATING PLAN? For the DASH eating plan, you will follow these general guidelines:  Choose foods with a percent daily value for sodium of less than 5% (as listed on the food label).  Use salt-free seasonings or herbs instead of table salt or sea salt.  Check with your health care provider or pharmacist before using salt substitutes.  Eat lower-sodium products, often labeled as "lower sodium" or "no salt added."  Eat fresh foods.  Eat more vegetables, fruits, and low-fat dairy products.  Choose whole grains. Look for the word "whole" as the first word in the ingredient list.  Choose fish and skinless chicken or [place] more often than red meat. Limit fish, poultry, and meat to 6 oz (170 g) each day.  Limit sweets, desserts, sugars, and sugary drinks.  Choose heart-healthy fats.  Limit cheese to 1 oz (28 g) per day.  Eat more home-cooked food and less restaurant, buffet, and fast food.  Limit  fried foods.  Cook foods using methods other than frying.  Limit canned vegetables. If you do use them, rinse them well to decrease the sodium.  When eating at a restaurant, ask that your food be prepared with less salt, or no salt if possible. WHAT FOODS CAN I EAT? Seek help from a dietitian for individual calorie needs. Grains Whole grain or whole wheat bread. Brown rice. Whole grain or whole wheat pasta. Quinoa, bulgur, and whole grain cereals. Low-sodium cereals. Corn or whole wheat flour tortillas. Whole grain cornbread. Whole grain crackers. Low-sodium crackers. Vegetables Fresh or frozen vegetables (raw, steamed, roasted, or grilled). Low-sodium or reduced-sodium tomato and vegetable juices. Low-sodium or reduced-sodium tomato sauce and paste. Low-sodium or reduced-sodium canned vegetables.  Fruits All fresh, canned (in natural juice), or frozen fruits. Meat and Other Protein Products Ground beef (85% or leaner), grass-fed beef, or beef trimmed of fat. Skinless chicken or [place]. Ground chicken or [place]. Pork trimmed of fat. All fish and seafood. Eggs. Dried beans, peas, or lentils. Unsalted nuts and seeds. Unsalted canned beans. Dairy Low-fat dairy products, such as skim or 1% milk, 2% or reduced-fat cheeses, low-fat ricotta or cottage cheese, or plain low-fat yogurt. Low-sodium or reduced-sodium cheeses. Fats and Oils Tub margarines without trans fats. Light or reduced-fat mayonnaise and salad dressings (reduced sodium). Avocado. Safflower, olive, or canola oils. Natural peanut or almond butter. Other Unsalted popcorn and pretzels. The items  listed above may not be a complete list of recommended foods or beverages. Contact your dietitian for more options. WHAT FOODS ARE NOT RECOMMENDED? Grains White bread. White pasta. White rice. Refined cornbread. Bagels and croissants. Crackers that contain trans fat. Vegetables Creamed or fried vegetables. Vegetables in a cheese sauce.  Regular canned vegetables. Regular canned tomato sauce and paste. Regular tomato and vegetable juices. Fruits Dried fruits. Canned fruit in light or heavy syrup. Fruit juice. Meat and Other Protein Products Fatty cuts of meat. Ribs, chicken wings, bacon, sausage, bologna, salami, chitterlings, fatback, hot dogs, bratwurst, and packaged luncheon meats. Salted nuts and seeds. Canned beans with salt. Dairy Whole or 2% milk, cream, half-and-half, and cream cheese. Whole-fat or sweetened yogurt. Full-fat cheeses or blue cheese. Nondairy creamers and whipped toppings. Processed cheese, cheese spreads, or cheese curds. Condiments Onion and garlic salt, seasoned salt, table salt, and sea salt. Canned and packaged gravies. Worcestershire sauce. Tartar sauce. Barbecue sauce. Teriyaki sauce. Soy sauce, including reduced sodium. Steak sauce. Fish sauce. Oyster sauce. Cocktail sauce. Horseradish. Ketchup and mustard. Meat flavorings and tenderizers. Bouillon cubes. Hot sauce. Tabasco sauce. Marinades. Taco seasonings. Relishes. Fats and Oils Butter, stick margarine, lard, shortening, ghee, and bacon fat. Coconut, palm kernel, or palm oils. Regular salad dressings. Other Pickles and olives. Salted popcorn and pretzels. The items listed above may not be a complete list of foods and beverages to avoid. Contact your dietitian for more information. WHERE CAN I FIND MORE INFORMATION? National Heart, [employer], and [employer] Institute: travelstabloid.com Document Released: 09/26/2011 Document Revised: 02/21/2014 Document Reviewed: 08/11/2013 [facility] Patient Information 2015 [place], [place]. This information is not intended to replace advice given to you by your health care provider. Make sure you discuss any questions you have with your health care provider.

## 2015-06-16 LAB — VITAMIN D 25 HYDROXY (VIT D DEFICIENCY, FRACTURES): VIT D 25 HYDROXY: 32 ng/mL (ref 30–100)

## 2015-08-17 ENCOUNTER — Ambulatory Visit (INDEPENDENT_AMBULATORY_CARE_PROVIDER_SITE_OTHER): Payer: BLUE CROSS/BLUE SHIELD | Admitting: Psychology

## 2015-08-17 DIAGNOSIS — F4323 Adjustment disorder with mixed anxiety and depressed mood: Secondary | ICD-10-CM

## 2015-08-31 ENCOUNTER — Ambulatory Visit (INDEPENDENT_AMBULATORY_CARE_PROVIDER_SITE_OTHER): Payer: BLUE CROSS/BLUE SHIELD | Admitting: Psychology

## 2015-08-31 DIAGNOSIS — F4323 Adjustment disorder with mixed anxiety and depressed mood: Secondary | ICD-10-CM | POA: Diagnosis not present

## 2015-09-28 ENCOUNTER — Ambulatory Visit: Payer: BLUE CROSS/BLUE SHIELD | Admitting: Psychology

## 2015-10-09 ENCOUNTER — Encounter: Payer: Self-pay | Admitting: Physician Assistant

## 2015-11-09 ENCOUNTER — Encounter: Payer: Self-pay | Admitting: Physician Assistant

## 2015-11-20 ENCOUNTER — Other Ambulatory Visit: Payer: Self-pay | Admitting: Emergency Medicine

## 2015-11-20 DIAGNOSIS — G4700 Insomnia, unspecified: Secondary | ICD-10-CM

## 2015-11-20 MED ORDER — ALPRAZOLAM 1 MG PO TABS: ORAL_TABLET | ORAL | Status: DC

## 2015-12-07 ENCOUNTER — Ambulatory Visit (INDEPENDENT_AMBULATORY_CARE_PROVIDER_SITE_OTHER): Payer: BLUE CROSS/BLUE SHIELD | Admitting: Psychology

## 2015-12-07 DIAGNOSIS — F4323 Adjustment disorder with mixed anxiety and depressed mood: Secondary | ICD-10-CM | POA: Diagnosis not present

## 2015-12-13 ENCOUNTER — Encounter: Payer: Self-pay | Admitting: Physician Assistant

## 2016-01-04 ENCOUNTER — Ambulatory Visit (INDEPENDENT_AMBULATORY_CARE_PROVIDER_SITE_OTHER): Payer: BLUE CROSS/BLUE SHIELD | Admitting: Physician Assistant

## 2016-01-04 ENCOUNTER — Encounter: Payer: Self-pay | Admitting: Physician Assistant

## 2016-01-04 VITALS — BP 114/66 | HR 70 | Temp 97.5°F | Resp 16 | Ht 64.5 in | Wt 166.4 lb

## 2016-01-04 DIAGNOSIS — E782 Mixed hyperlipidemia: Secondary | ICD-10-CM

## 2016-01-04 DIAGNOSIS — E559 Vitamin D deficiency, unspecified: Secondary | ICD-10-CM | POA: Diagnosis not present

## 2016-01-04 DIAGNOSIS — Z79899 Other long term (current) drug therapy: Secondary | ICD-10-CM

## 2016-01-04 DIAGNOSIS — R6889 Other general symptoms and signs: Secondary | ICD-10-CM | POA: Diagnosis not present

## 2016-01-04 DIAGNOSIS — M25511 Pain in right shoulder: Secondary | ICD-10-CM

## 2016-01-04 DIAGNOSIS — F411 Generalized anxiety disorder: Secondary | ICD-10-CM | POA: Diagnosis not present

## 2016-01-04 DIAGNOSIS — C4359 Malignant melanoma of other part of trunk: Secondary | ICD-10-CM

## 2016-01-04 DIAGNOSIS — M8580 Other specified disorders of bone density and structure, unspecified site: Secondary | ICD-10-CM

## 2016-01-04 DIAGNOSIS — Z131 Encounter for screening for diabetes mellitus: Secondary | ICD-10-CM | POA: Diagnosis not present

## 2016-01-04 DIAGNOSIS — R5383 Other fatigue: Secondary | ICD-10-CM

## 2016-01-04 DIAGNOSIS — I10 Essential (primary) hypertension: Secondary | ICD-10-CM | POA: Diagnosis not present

## 2016-01-04 DIAGNOSIS — I447 Left bundle-branch block, unspecified: Secondary | ICD-10-CM | POA: Diagnosis not present

## 2016-01-04 DIAGNOSIS — Z0001 Encounter for general adult medical examination with abnormal findings: Secondary | ICD-10-CM | POA: Diagnosis not present

## 2016-01-04 DIAGNOSIS — M25512 Pain in left shoulder: Secondary | ICD-10-CM

## 2016-01-04 NOTE — Progress Notes (Signed)
Assessment and Plan:  1. Essential hypertension - CBC with Differential/Platelet - BASIC METABOLIC PANEL WITH GFR - Hepatic function panel - TSH - Urinalysis, Routine w reflex microscopic (not at [facility]) - Microalbumin / creatinine urine ratio - EKG 12-Lead  2. Mixed hyperlipidemia - Lipid panel  3. Anxiety state Declines meds, suggest counseling  4. Vitamin D deficiency - VITAMIN D 25 Hydroxy (Vit-D Deficiency, Fractures)  5. Medication management - Magnesium - Gamma GT  6. Malignant melanoma of torso excluding breast (HCC) Continue derm follow up  7. Osteopenia - DG Bone Density; Future  8. Encounter for general adult medical examination with abnormal findings Follow for colonoscopy next year, follow up GYN for PAP - CBC with Differential/Platelet - BASIC METABOLIC PANEL WITH GFR - Hepatic function panel - TSH - Lipid panel - Hemoglobin A1c - Magnesium - VITAMIN D 25 Hydroxy (Vit-D Deficiency, Fractures) - Urinalysis, Routine w reflex microscopic (not at [facility]) - Microalbumin / creatinine urine ratio - EKG 12-Lead - Gamma GT - DG Bone Density; Future  9. Screening for diabetes mellitus - Hemoglobin A1c  10. Other fatigue - Iron and TIBC - Ferritin - Vitamin B12  11. Pain of both shoulder joints - Sedimentation rate - Ambulatory referral to Orthopedics   Continue diet and meds as discussed. Further disposition pending results of labs. Over 30 minutes of exam, counseling, chart review, and critical decision making was performed  Future Appointments Date Time Provider [place]  01/20/2017 2:00 PM Vicie Mutters, PA-C [facility] None     HPI 60 y.o. female  presents for 6 month follow up on hypertension, cholesterol, prediabetes, and vitamin D deficiency.   Her blood pressure has been controlled at home, she is on ziac 5mg  1/2 a tablet, today their BP is BP: 114/66 mmHg  She does workout. She denies chest pain, shortness of breath, dizziness.  She is not on cholesterol medication and denies myalgias. Her cholesterol is at goal. The cholesterol last visit was:   Lab Results  Component Value Date   CHOL 190 06/15/2015   HDL 90 06/15/2015   LDLCALC 83 06/15/2015   TRIG 86 06/15/2015   CHOLHDL 2.1 06/15/2015   Last A1C in the office was:  Lab Results  Component Value Date   HGBA1C 5.5 09/30/2014   Patient is on Vitamin D supplement, 5000 IU daily.   Lab Results  Component Value Date   VD25OH 32 06/15/2015     She has history of anxiety, was on wellbutrin in the past, takes xanax very rarely.   She is on estrogen due to menopausal sympome from her OB/GYN.  Lab Results  Component Value Date   ALT 19 06/15/2015   AST 17 06/15/2015   ALKPHOS 61 06/15/2015   BILITOT 0.5 06/15/2015   States has bilateral shoulder pain x3-4 years getting worse, worse with abduction/exertional rotation.  BMI is Body mass index is 28.13 kg/(m^2)., she is working on diet and exercise. Wt Readings from Last 3 Encounters:  01/04/16 166 lb 6.4 oz (75.479 kg)  06/15/15 154 lb 3.2 oz (69.945 kg)  09/30/14 154 lb (69.854 kg)    Current Medications:  Current Outpatient Prescriptions on File Prior to Visit  Medication Sig Dispense Refill  . aspirin EC 81 MG tablet Take 1 tablet (81 mg total) by mouth daily. 90 tablet 2  . bisoprolol-hydrochlorothiazide (ZIAC) 5-6.25 MG per tablet Take 1 tablet by mouth daily. 30 tablet 3  . cholecalciferol (VITAMIN D) 1000 UNITS tablet Take 5,000  Units by mouth daily.     Marland Kitchen estradiol (ESTRACE) 0.5 MG tablet Take 1 tablet (0.5 mg total) by mouth daily. 90 tablet 0  . folic acid (FOLVITE) A999333 MCG tablet Take 400 mcg by mouth. Not consistent    . Lactobacillus (ACIDOPHILUS PO) Take by mouth as needed.    . Multiple Vitamin (MULTIVITAMIN) tablet Take 1 tablet by mouth daily.     No current facility-administered medications on file prior to visit.   Medical History:  Past Medical History  Diagnosis Date  .  Hypertension   . Hyperlipidemia   . Anxiety   . Bundle, branch block, left   . Melanoma ([place]) 2012    chest   Social History  Substance Use Topics  . Smoking status: Never Smoker   . Smokeless tobacco: Never Used  . Alcohol Use: 4.2 oz/week    7 Glasses of wine per week   Immunization History  Administered Date(s) Administered  . Influenza-Unspecified 08/05/2013, 09/18/2014  . PPD Test 09/30/2014  . Td 08/30/2005   IMMUNIZATIONS: Tdap: 2012 Pneumovax:declines Zostavax:n/a Influenza:2016  MAINTENANCE: Colonoscopy:02/16/14 polyp due 2018 Mammo:12/2014 DUE BMD: 2012 osteopenia  Pap/ Pelvic:due 2017 going to GYN CXR 08/2013 [id number]  Dentist:q 6 month Echo:EF55-60%  Patient Care Team: Unk Pinto, MD as PCP - General (Internal Medicine) Servando Salina, MD as Consulting Physician (Obstetrics and Gynecology) Adrian Prows, MD as Consulting Physician (Cardiology) Lafayette Dragon, MD as Consulting Physician (Gastroenterology) Jari Pigg, MD as Consulting Physician (Dermatology) Jacelyn Pi, MD as Consulting Physician (Endocrinology)  Review of Systems:  Review of Systems  Constitutional: Positive for malaise/fatigue. Negative for fever, chills, weight loss and diaphoresis.  HENT: Negative.   Eyes: Negative.   Respiratory: Negative.   Cardiovascular: Negative.   Gastrointestinal: Negative.   Genitourinary: Negative.   Musculoskeletal: Positive for joint pain. Negative for myalgias, back pain, falls and neck pain.  Skin: Negative.   Neurological: Negative.  Negative for weakness.  Endo/Heme/Allergies: Negative.   Psychiatric/Behavioral: Negative.    Allergies Allergies  Allergen Reactions  . Doxycycline Hives   Surgical history Past Surgical History  Procedure Laterality Date  . Partial hysterectomy  30    uterine polyps  . Tonsillectomy    . Melanoma excision      chest   Family history Family History  Problem Relation Age of Onset  .  Hypertension Mother   . Cancer Father   . Prostate cancer Father   . Cancer Maternal Grandmother   . Heart disease Paternal Grandmother   . Stroke Paternal Grandmother   . Cancer Paternal Grandfather   . Colon cancer Maternal Aunt     Physical Exam: BP 114/66 mmHg  Pulse 70  Temp(Src) 97.5 F (36.4 C) (Temporal)  Resp 16  Ht 5' 4.5" (1.638 m)  Wt 166 lb 6.4 oz (75.479 kg)  BMI 28.13 kg/m2  SpO2 96% Wt Readings from Last 3 Encounters:  01/04/16 166 lb 6.4 oz (75.479 kg)  06/15/15 154 lb 3.2 oz (69.945 kg)  09/30/14 154 lb (69.854 kg)   General Appearance: Well nourished, in no apparent distress. Eyes: PERRLA, EOMs, conjunctiva no swelling or erythema Sinuses: No Frontal/maxillary tenderness ENT/Mouth: Ext aud canals clear, TMs without erythema, bulging. No erythema, swelling, or exudate on post pharynx.  Tonsils not swollen or erythematous. Hearing normal.  Neck: Supple, thyroid normal.  Respiratory: Respiratory effort normal, BS equal bilaterally without rales, rhonchi, wheezing or stridor.  Breast: defer GYN Cardio: RRR with no MRGs. Brisk peripheral pulses without  edema.  Abdomen: Soft, + BS,  Non tender, no guarding, rebound, hernias, masses. Lymphatics: Non tender without lymphadenopathy.  Musculoskeletal: Full ROM, 5/5 strength, Normal gait Skin: Warm, dry without rashes, lesions, ecchymosis.  GYN: defer GYN Neuro: Cranial nerves intact. Normal muscle tone, no cerebellar symptoms. Psych: Awake and oriented X 3, normal affect, Insight and Judgment appropriate.   EKG: unchanged LBBB  Vicie Mutters, PA-C 3:15 PM [facility]

## 2016-01-05 LAB — HEPATIC FUNCTION PANEL
ALBUMIN: 4.3 g/dL (ref 3.6–5.1)
ALK PHOS: 62 U/L (ref 33–130)
ALT: 26 U/L (ref 6–29)
AST: 20 U/L (ref 10–35)
Bilirubin, Direct: 0.1 mg/dL (ref <=0.2)
Indirect Bilirubin: 0.3 mg/dL (ref 0.2–1.2)
Total Bilirubin: 0.4 mg/dL (ref 0.2–1.2)
Total Protein: 6.5 g/dL (ref 6.1–8.1)

## 2016-01-05 LAB — MICROALBUMIN / CREATININE URINE RATIO
CREATININE, URINE: 19 mg/dL — AB (ref 20–320)
MICROALB UR: 0.2 mg/dL
MICROALB/CREAT RATIO: 11 ug/mg{creat} (ref <30)

## 2016-01-05 LAB — URINALYSIS, ROUTINE W REFLEX MICROSCOPIC
Bilirubin Urine: NEGATIVE
Glucose, UA: NEGATIVE
HGB URINE DIPSTICK: NEGATIVE
Ketones, ur: NEGATIVE
NITRITE: NEGATIVE
PROTEIN: NEGATIVE
Specific Gravity, Urine: 1.005 (ref 1.001–1.035)
pH: 6.5 (ref 5.0–8.0)

## 2016-01-05 LAB — BASIC METABOLIC PANEL WITH GFR
BUN: 14 mg/dL (ref 7–25)
CHLORIDE: 104 mmol/L (ref 98–110)
CO2: 29 mmol/L (ref 20–31)
CREATININE: 0.82 mg/dL (ref 0.50–1.05)
Calcium: 9.8 mg/dL (ref 8.6–10.4)
GFR, Est African American: >89 mL/min (ref >=60)
GFR, Est Non African American: 79 mL/min (ref >=60)
Glucose, Bld: 89 mg/dL (ref 65–99)
POTASSIUM: 4.1 mmol/L (ref 3.5–5.3)
Sodium: 140 mmol/L (ref 135–146)

## 2016-01-05 LAB — CBC WITH DIFFERENTIAL/PLATELET
BASOS ABS: 0 10*3/uL (ref 0.0–0.1)
BASOS PCT: 0 % (ref 0–1)
Eosinophils Absolute: 0.1 10*3/uL (ref 0.0–0.7)
Eosinophils Relative: 1 % (ref 0–5)
HCT: 46.9 % — ABNORMAL HIGH (ref 36.0–46.0)
HEMOGLOBIN: 15.8 g/dL — AB (ref 12.0–15.0)
LYMPHS ABS: 2.1 10*3/uL (ref 0.7–4.0)
Lymphocytes Relative: 27 % (ref 12–46)
MCH: 30.2 pg (ref 26.0–34.0)
MCHC: 33.7 g/dL (ref 30.0–36.0)
MCV: 89.5 fL (ref 78.0–100.0)
MONOS PCT: 6 % (ref 3–12)
MPV: 9.8 fL (ref 8.6–12.4)
Monocytes Absolute: 0.5 10*3/uL (ref 0.1–1.0)
NEUTROS ABS: 5 10*3/uL (ref 1.7–7.7)
NEUTROS PCT: 66 % (ref 43–77)
Platelets: 286 10*3/uL (ref 150–400)
RBC: 5.24 MIL/uL — ABNORMAL HIGH (ref 3.87–5.11)
RDW: 12.9 % (ref 11.5–15.5)
WBC: 7.6 10*3/uL (ref 4.0–10.5)

## 2016-01-05 LAB — URINALYSIS, MICROSCOPIC ONLY
Bacteria, UA: NONE SEEN [HPF]
CRYSTALS: NONE SEEN [HPF]
Casts: NONE SEEN [LPF]
RBC / HPF: NONE SEEN RBC/HPF (ref <=2)
Squamous Epithelial / LPF: NONE SEEN [HPF] (ref <=5)
WBC UA: NONE SEEN WBC/HPF (ref <=5)
Yeast: NONE SEEN [HPF]

## 2016-01-05 LAB — MAGNESIUM: MAGNESIUM: 2.2 mg/dL (ref 1.5–2.5)

## 2016-01-05 LAB — LIPID PANEL
CHOLESTEROL: 189 mg/dL (ref 125–200)
HDL: 72 mg/dL (ref >=46)
LDL Cholesterol: 88 mg/dL (ref <130)
TRIGLYCERIDES: 146 mg/dL (ref <150)
Total CHOL/HDL Ratio: 2.6 Ratio (ref <=5.0)
VLDL: 29 mg/dL (ref <30)

## 2016-01-05 LAB — GAMMA GT: GGT: 14 U/L (ref 7–51)

## 2016-01-05 LAB — VITAMIN D 25 HYDROXY (VIT D DEFICIENCY, FRACTURES): VIT D 25 HYDROXY: 45 ng/mL (ref 30–100)

## 2016-01-05 LAB — FERRITIN: Ferritin: 224 ng/mL (ref 10–232)

## 2016-01-05 LAB — IRON AND TIBC
%SAT: 36 % (ref 11–50)
Iron: 118 ug/dL (ref 45–160)
TIBC: 328 ug/dL (ref 250–450)
UIBC: 210 ug/dL (ref 125–400)

## 2016-01-05 LAB — TSH: TSH: 1.09 m[IU]/L

## 2016-01-05 LAB — HEMOGLOBIN A1C
Hgb A1c MFr Bld: 5.5 % (ref <5.7)
Mean Plasma Glucose: 111 mg/dL (ref <117)

## 2016-01-05 LAB — SEDIMENTATION RATE: SED RATE: 1 mm/h (ref 0–30)

## 2016-01-05 LAB — VITAMIN B12: Vitamin B-12: 547 pg/mL (ref 200–1100)

## 2016-01-25 ENCOUNTER — Ambulatory Visit (INDEPENDENT_AMBULATORY_CARE_PROVIDER_SITE_OTHER): Payer: BLUE CROSS/BLUE SHIELD | Admitting: Psychology

## 2016-01-25 DIAGNOSIS — M7541 Impingement syndrome of right shoulder: Secondary | ICD-10-CM | POA: Diagnosis not present

## 2016-01-25 DIAGNOSIS — M25511 Pain in right shoulder: Secondary | ICD-10-CM | POA: Diagnosis not present

## 2016-01-25 DIAGNOSIS — F4323 Adjustment disorder with mixed anxiety and depressed mood: Secondary | ICD-10-CM

## 2016-01-25 DIAGNOSIS — M25512 Pain in left shoulder: Secondary | ICD-10-CM | POA: Diagnosis not present

## 2016-01-25 DIAGNOSIS — M7542 Impingement syndrome of left shoulder: Secondary | ICD-10-CM | POA: Diagnosis not present

## 2016-01-26 ENCOUNTER — Other Ambulatory Visit: Payer: Self-pay | Admitting: Internal Medicine

## 2016-01-30 ENCOUNTER — Ambulatory Visit (INDEPENDENT_AMBULATORY_CARE_PROVIDER_SITE_OTHER): Payer: BLUE CROSS/BLUE SHIELD | Admitting: Psychology

## 2016-01-30 DIAGNOSIS — F4323 Adjustment disorder with mixed anxiety and depressed mood: Secondary | ICD-10-CM | POA: Diagnosis not present

## 2016-02-08 ENCOUNTER — Ambulatory Visit (INDEPENDENT_AMBULATORY_CARE_PROVIDER_SITE_OTHER): Payer: BLUE CROSS/BLUE SHIELD | Admitting: Psychology

## 2016-02-08 DIAGNOSIS — F4323 Adjustment disorder with mixed anxiety and depressed mood: Secondary | ICD-10-CM | POA: Diagnosis not present

## 2016-02-15 ENCOUNTER — Ambulatory Visit: Payer: BLUE CROSS/BLUE SHIELD | Admitting: Psychology

## 2016-02-22 ENCOUNTER — Ambulatory Visit (INDEPENDENT_AMBULATORY_CARE_PROVIDER_SITE_OTHER): Payer: BLUE CROSS/BLUE SHIELD | Admitting: Psychology

## 2016-03-07 ENCOUNTER — Ambulatory Visit (INDEPENDENT_AMBULATORY_CARE_PROVIDER_SITE_OTHER): Payer: BLUE CROSS/BLUE SHIELD | Admitting: Psychology

## 2016-03-07 DIAGNOSIS — Z780 Asymptomatic menopausal state: Secondary | ICD-10-CM | POA: Diagnosis not present

## 2016-03-07 DIAGNOSIS — M85851 Other specified disorders of bone density and structure, right thigh: Secondary | ICD-10-CM | POA: Diagnosis not present

## 2016-03-07 DIAGNOSIS — F4323 Adjustment disorder with mixed anxiety and depressed mood: Secondary | ICD-10-CM

## 2016-03-07 DIAGNOSIS — Z1231 Encounter for screening mammogram for malignant neoplasm of breast: Secondary | ICD-10-CM | POA: Diagnosis not present

## 2016-03-14 ENCOUNTER — Ambulatory Visit: Payer: BLUE CROSS/BLUE SHIELD | Admitting: Psychology

## 2016-03-14 DIAGNOSIS — N6489 Other specified disorders of breast: Secondary | ICD-10-CM | POA: Diagnosis not present

## 2016-03-14 DIAGNOSIS — N63 Unspecified lump in breast: Secondary | ICD-10-CM | POA: Diagnosis not present

## 2016-03-21 ENCOUNTER — Ambulatory Visit (INDEPENDENT_AMBULATORY_CARE_PROVIDER_SITE_OTHER): Payer: BLUE CROSS/BLUE SHIELD | Admitting: Psychology

## 2016-03-21 DIAGNOSIS — F4323 Adjustment disorder with mixed anxiety and depressed mood: Secondary | ICD-10-CM | POA: Diagnosis not present

## 2016-04-04 ENCOUNTER — Ambulatory Visit (INDEPENDENT_AMBULATORY_CARE_PROVIDER_SITE_OTHER): Payer: BLUE CROSS/BLUE SHIELD | Admitting: Psychology

## 2016-04-04 DIAGNOSIS — F4323 Adjustment disorder with mixed anxiety and depressed mood: Secondary | ICD-10-CM | POA: Diagnosis not present

## 2016-04-14 ENCOUNTER — Encounter: Payer: Self-pay | Admitting: *Deleted

## 2016-04-18 ENCOUNTER — Ambulatory Visit (INDEPENDENT_AMBULATORY_CARE_PROVIDER_SITE_OTHER): Payer: BLUE CROSS/BLUE SHIELD | Admitting: Psychology

## 2016-05-02 ENCOUNTER — Ambulatory Visit: Payer: BLUE CROSS/BLUE SHIELD | Admitting: Psychology

## 2016-05-09 ENCOUNTER — Other Ambulatory Visit: Payer: Self-pay | Admitting: Internal Medicine

## 2016-05-28 ENCOUNTER — Encounter: Payer: Self-pay | Admitting: Physician Assistant

## 2016-05-28 ENCOUNTER — Ambulatory Visit (INDEPENDENT_AMBULATORY_CARE_PROVIDER_SITE_OTHER): Payer: BLUE CROSS/BLUE SHIELD | Admitting: Physician Assistant

## 2016-05-28 VITALS — BP 128/80 | HR 60 | Temp 98.0°F | Resp 16 | Ht 64.5 in | Wt 165.0 lb

## 2016-05-28 DIAGNOSIS — N6320 Unspecified lump in the left breast, unspecified quadrant: Secondary | ICD-10-CM

## 2016-05-28 DIAGNOSIS — N63 Unspecified lump in breast: Secondary | ICD-10-CM | POA: Diagnosis not present

## 2016-05-28 DIAGNOSIS — N644 Mastodynia: Secondary | ICD-10-CM

## 2016-05-28 NOTE — Progress Notes (Signed)
   Subjective:    Patient ID: Maureen Henderson, female    DOB: 13-Jan-1956, 60 y.o.   MRN: [id number]  HPI 60 y.o. WF presents with left breast abnormality. She had a MGM in 02/2016 that did show left breast central nipple abnormality, going to go back in Nov, since that time has tender nodule on exterior breast, no discharge from nodule or nipply, has been using heat that she states feels good. Denies fever, chills, weight loss, night sweats. She is on estrace.   Blood pressure 128/80, pulse 60, temperature 98 F (36.7 C), temperature source Temporal, resp. rate 16, height 5' 4.5" (1.638 m), weight 165 lb (74.8 kg).  Medications Current Outpatient Prescriptions on File Prior to Visit  Medication Sig  . aspirin EC 81 MG tablet Take 1 tablet (81 mg total) by mouth daily.  . bisoprolol-hydrochlorothiazide (ZIAC) 5-6.25 MG tablet TAKE 1 TABLET BY MOUTH DAILY  . cholecalciferol (VITAMIN D) 1000 UNITS tablet Take 5,000 Units by mouth daily.   Marland Kitchen estradiol (ESTRACE) 0.5 MG tablet TAKE 1 TABLET BY MOUTH ONCE DAILY  . Multiple Vitamin (MULTIVITAMIN) tablet Take 1 tablet by mouth daily.   No current facility-administered medications on file prior to visit.     Problem list She has Essential hypertension; Anxiety state; Melanoma ([place]); Mixed hyperlipidemia; Osteopenia; and LBBB (left bundle branch block) on her problem list.  Review of Systems  Constitutional: Negative.  Negative for chills, fatigue and unexpected weight change.  Genitourinary: Negative.   Skin:       New breast mass  Neurological: Negative.        Objective:   Physical Exam  Pulmonary/Chest:           Assessment & Plan:  Left breast with tender mass behind nipple Will get [place] to rule out mass especially with her on estrogen and recent abnormal MGM, likely cyst, no warmth, redness, continue heating pad, decrease salt/sugar/etc.

## 2016-05-30 ENCOUNTER — Ambulatory Visit: Payer: BLUE CROSS/BLUE SHIELD | Admitting: Psychology

## 2016-05-30 DIAGNOSIS — N63 Unspecified lump in breast: Secondary | ICD-10-CM | POA: Diagnosis not present

## 2016-06-03 ENCOUNTER — Encounter: Payer: Self-pay | Admitting: *Deleted

## 2016-06-11 ENCOUNTER — Ambulatory Visit (INDEPENDENT_AMBULATORY_CARE_PROVIDER_SITE_OTHER): Payer: BLUE CROSS/BLUE SHIELD | Admitting: Psychology

## 2016-06-11 DIAGNOSIS — F4323 Adjustment disorder with mixed anxiety and depressed mood: Secondary | ICD-10-CM

## 2016-07-04 ENCOUNTER — Other Ambulatory Visit: Payer: Self-pay | Admitting: Internal Medicine

## 2016-09-19 DIAGNOSIS — N6321 Unspecified lump in the left breast, upper outer quadrant: Secondary | ICD-10-CM | POA: Diagnosis not present

## 2016-10-09 ENCOUNTER — Encounter: Payer: Self-pay | Admitting: Physician Assistant

## 2016-10-18 ENCOUNTER — Other Ambulatory Visit: Payer: Self-pay | Admitting: Physician Assistant

## 2016-10-25 ENCOUNTER — Encounter: Payer: Self-pay | Admitting: *Deleted

## 2016-11-12 ENCOUNTER — Encounter: Payer: Self-pay | Admitting: Physician Assistant

## 2016-11-17 DIAGNOSIS — Z23 Encounter for immunization: Secondary | ICD-10-CM | POA: Diagnosis not present

## 2016-12-02 ENCOUNTER — Encounter: Payer: Self-pay | Admitting: Gastroenterology

## 2016-12-18 ENCOUNTER — Encounter: Payer: Self-pay | Admitting: Physician Assistant

## 2016-12-18 ENCOUNTER — Ambulatory Visit (INDEPENDENT_AMBULATORY_CARE_PROVIDER_SITE_OTHER): Payer: BLUE CROSS/BLUE SHIELD | Admitting: Physician Assistant

## 2016-12-18 VITALS — BP 118/60 | HR 68 | Temp 97.3°F | Resp 14 | Ht 64.5 in | Wt 164.0 lb

## 2016-12-18 DIAGNOSIS — C4359 Malignant melanoma of other part of trunk: Secondary | ICD-10-CM | POA: Diagnosis not present

## 2016-12-18 DIAGNOSIS — E559 Vitamin D deficiency, unspecified: Secondary | ICD-10-CM | POA: Diagnosis not present

## 2016-12-18 DIAGNOSIS — Z23 Encounter for immunization: Secondary | ICD-10-CM | POA: Diagnosis not present

## 2016-12-18 DIAGNOSIS — I10 Essential (primary) hypertension: Secondary | ICD-10-CM | POA: Diagnosis not present

## 2016-12-18 DIAGNOSIS — R5383 Other fatigue: Secondary | ICD-10-CM

## 2016-12-18 DIAGNOSIS — I447 Left bundle-branch block, unspecified: Secondary | ICD-10-CM | POA: Diagnosis not present

## 2016-12-18 DIAGNOSIS — M8580 Other specified disorders of bone density and structure, unspecified site: Secondary | ICD-10-CM | POA: Diagnosis not present

## 2016-12-18 DIAGNOSIS — F411 Generalized anxiety disorder: Secondary | ICD-10-CM | POA: Diagnosis not present

## 2016-12-18 DIAGNOSIS — Z79899 Other long term (current) drug therapy: Secondary | ICD-10-CM | POA: Diagnosis not present

## 2016-12-18 DIAGNOSIS — Z0001 Encounter for general adult medical examination with abnormal findings: Secondary | ICD-10-CM | POA: Diagnosis not present

## 2016-12-18 DIAGNOSIS — E782 Mixed hyperlipidemia: Secondary | ICD-10-CM | POA: Diagnosis not present

## 2016-12-18 DIAGNOSIS — Z2911 Encounter for prophylactic immunotherapy for respiratory syncytial virus (RSV): Secondary | ICD-10-CM | POA: Diagnosis not present

## 2016-12-18 MED ORDER — CITALOPRAM HYDROBROMIDE 20 MG PO TABS: 20.0000 mg | ORAL_TABLET | Freq: Every day | ORAL | 2 refills | Status: DC

## 2016-12-18 NOTE — Progress Notes (Signed)
   Subjective:    Patient ID: Maureen Henderson, female    DOB: 10-Mar-1956, 61 y.o.   MRN: [id number]  HPI 61 y.o. WF with history of anxiety presents with fatigue.  States that she has had severe fatigue, falls asleep very easily, has difficult time getting up in the morning/does not feel rested. She has increased water, exercises daily. Does not snore at night, no dry mouth/HA in the AM.  She has had intolerance to alcohol and hair falling out. Her CBC has been low elevated, she has had normal sed rate.  She has been on depression/anxiety medications in the past.  Will follow up with Derm in May.   Blood pressure 118/60, pulse 68, temperature 97.3 F (36.3 C), resp. rate 14, height 5' 4.5" (1.638 m), weight 164 lb (74.4 kg), SpO2 98 %.  Medications Current Outpatient Prescriptions on File Prior to Visit  Medication Sig  . bisoprolol-hydrochlorothiazide (ZIAC) 5-6.25 MG tablet TAKE 1 TABLET BY MOUTH DAILY  . cholecalciferol (VITAMIN D) 1000 UNITS tablet Take 5,000 Units by mouth daily.   Marland Kitchen estradiol (ESTRACE) 0.5 MG tablet TAKE 1 TABLET BY MOUTH ONCE DAILY  . Multiple Vitamin (MULTIVITAMIN) tablet Take 1 tablet by mouth daily.  . vitamin C (ASCORBIC ACID) 500 MG tablet Take 500 mg by mouth daily.   No current facility-administered medications on file prior to visit.     Problem list She has Essential hypertension; Anxiety state; Melanoma ([place]); Mixed hyperlipidemia; Osteopenia; and LBBB (left bundle branch block) on her problem list.   Review of Systems  Constitutional: Positive for fatigue.  HENT: Negative.   Respiratory: Negative.   Cardiovascular: Negative.   Gastrointestinal: Negative.   Genitourinary: Negative.   Musculoskeletal: Negative.   Skin: Negative.   Neurological: Negative.   Hematological: Negative.   Psychiatric/Behavioral: The patient is nervous/anxious.   All other systems reviewed and are negative.      Objective:   Physical Exam  Constitutional: She is  oriented to person, place, and time. She appears well-developed and well-nourished.  HENT:  Head: Normocephalic and atraumatic.  Right Ear: External ear normal.  Left Ear: External ear normal.  Mouth/Throat: Oropharynx is clear and moist.  Eyes: Conjunctivae and EOM are normal. Pupils are equal, round, and reactive to light.  Neck: Normal range of motion. Neck supple. No thyromegaly present.  Cardiovascular: Normal rate, regular rhythm and normal heart sounds.  Exam reveals no gallop and no friction rub.   No murmur heard. Pulmonary/Chest: Effort normal and breath sounds normal. No respiratory distress. She has no wheezes.  Abdominal: Soft. Bowel sounds are normal. She exhibits no distension and no mass. There is no tenderness. There is no rebound and no guarding.  Musculoskeletal: Normal range of motion.  Lymphadenopathy:    She has no cervical adenopathy.  Neurological: She is alert and oriented to person, place, and time. She displays normal reflexes. No cranial nerve deficit. Coordination normal.  Skin: Skin is warm and dry.  Psychiatric: She has a normal mood and affect.       Assessment & Plan:  Fatigue Will try celexa, declines labs until CPE.   Future Appointments Date Time Provider [place]  01/20/2017 2:00 PM Vicie Mutters, PA-C [facility] None

## 2016-12-18 NOTE — Patient Instructions (Signed)
Major Depressive Disorder, Adult Major depressive disorder (MDD) is a mental health condition. It may also be called clinical depression or unipolar depression. MDD usually causes feelings of sadness, hopelessness, or helplessness. MDD can also cause physical symptoms. It can interfere with work, school, relationships, and other everyday activities. MDD may be mild, moderate, or severe. It may occur once (single episode major depressive disorder) or it may occur multiple times (recurrent major depressive disorder). What are the causes? The exact cause of this condition is not known. MDD is most likely caused by a combination of things, which may include:  Genetic factors. These are traits that are passed along from parent to child.  Individual factors. Your personality, your behavior, and the way you handle your thoughts and feelings may contribute to MDD. This includes personality traits and behaviors learned from others.  Physical factors, such as: ? Differences in the part of your brain that controls emotion. This part of your brain may be different than it is in people who do not have MDD. ? Long-term (chronic) medical or psychiatric illnesses.  Social factors. Traumatic experiences or major life changes may play a role in the development of MDD.  What increases the risk? This condition is more likely to develop in women. The following factors may also make you more likely to develop MDD:  A family history of depression.  Troubled family relationships.  Abnormally low levels of certain brain chemicals.  Traumatic events in childhood, especially abuse or the loss of a parent.  Being under a lot of stress, or long-term stress, especially from upsetting life experiences or losses.  A history of: ? Chronic physical illness. ? Other mental health disorders. ? Substance abuse.  Poor living conditions.  Experiencing social exclusion or discrimination on a regular basis.  What are  the signs or symptoms? The main symptoms of MDD typically include:  Constant depressed or irritable mood.  Loss of interest in things and activities.  MDD symptoms may also include:  Sleeping or eating too much or too little.  Unexplained weight change.  Fatigue or low energy.  Feelings of worthlessness or guilt.  Difficulty thinking clearly or making decisions.  Thoughts of suicide or of harming others.  Physical agitation or weakness.  Isolation.  Severe cases of MDD may also occur with other symptoms, such as:  Delusions or hallucinations, in which you imagine things that are not real (psychotic depression).  Low-level depression that lasts at least a year (chronic depression or persistent depressive disorder).  Extreme sadness and hopelessness (melancholic depression).  Trouble speaking and moving (catatonic depression).  How is this diagnosed? This condition may be diagnosed based on:  Your symptoms.  Your medical history, including your mental health history. This may involve tests to evaluate your mental health. You may be asked questions about your lifestyle, including any drug and alcohol use, and how long you have had symptoms of MDD.  A physical exam.  Blood tests to rule out other conditions.  You must have a depressed mood and at least four other MDD symptoms most of the day, nearly every day in the same 2-week timeframe before your health care provider can confirm a diagnosis of MDD. How is this treated? This condition is usually treated by mental health professionals, such as psychologists, psychiatrists, and clinical social workers. You may need more than one type of treatment. Treatment may include:  Psychotherapy. This is also called talk therapy or counseling. Types of psychotherapy include: ? Cognitive behavioral   therapy (CBT). This type of therapy teaches you to recognize unhealthy feelings, thoughts, and behaviors, and replace them with  positive thoughts and actions. ? Interpersonal therapy (IPT). This helps you to improve the way you relate to and communicate with others. ? Family therapy. This treatment includes members of your family.  Medicine to treat anxiety and depression, or to help you control certain emotions and behaviors.  Lifestyle changes, such as: ? Limiting alcohol and drug use. ? Exercising regularly. ? Getting plenty of sleep. ? Making healthy eating choices. ? Spending more time outdoors.  Treatments involving stimulation of the brain can be used in situations with extremely severe symptoms, or when medicine or other therapies do not work over time. These treatments include electroconvulsive therapy, transcranial magnetic stimulation, and vagal nerve stimulation. Follow these instructions at home: Activity  Return to your normal activities as told by your health care provider.  Exercise regularly and spend time outdoors as told by your health care provider. General instructions  Take over-the-counter and prescription medicines only as told by your health care provider.  Do not drink alcohol. If you drink alcohol, limit your alcohol intake to no more than 1 drink a day for nonpregnant women and 2 drinks a day for men. One drink equals 12 oz of beer, 5 oz of wine, or 1 oz of hard liquor. Alcohol can affect any antidepressant medicines you are taking. Talk to your health care provider about your alcohol use.  Eat a healthy diet and get plenty of sleep.  Find activities that you enjoy doing, and make time to do them.  Consider joining a support group. Your health care provider may be able to recommend a support group.  Keep all follow-up visits as told by your health care provider. This is important. Where to find more information: National Alliance on Mental Illness  www.nami.org  [place]. National Institute of Mental Health  www.nimh.nih.gov  National Suicide Prevention  Lifeline  1-800-273-TALK (8255). This is free, 24-hour help.  Contact a health care provider if:  Your symptoms get worse.  You develop new symptoms. Get help right away if:  You self-harm.  You have serious thoughts about hurting yourself or others.  You see, hear, taste, smell, or feel things that are not present (hallucinate). This information is not intended to replace advice given to you by your health care provider. Make sure you discuss any questions you have with your health care provider. Document Released: 02/01/2013 Document Revised: 06/13/2016 Document Reviewed: 04/17/2016 Elsevier Interactive Patient Education  2017 [employer]  

## 2017-01-13 DIAGNOSIS — D2272 Melanocytic nevi of left lower limb, including hip: Secondary | ICD-10-CM | POA: Diagnosis not present

## 2017-01-13 DIAGNOSIS — D2261 Melanocytic nevi of right upper limb, including shoulder: Secondary | ICD-10-CM | POA: Diagnosis not present

## 2017-01-13 DIAGNOSIS — L719 Rosacea, unspecified: Secondary | ICD-10-CM | POA: Diagnosis not present

## 2017-01-13 DIAGNOSIS — L820 Inflamed seborrheic keratosis: Secondary | ICD-10-CM | POA: Diagnosis not present

## 2017-01-13 DIAGNOSIS — Z87898 Personal history of other specified conditions: Secondary | ICD-10-CM | POA: Diagnosis not present

## 2017-01-20 ENCOUNTER — Encounter: Payer: Self-pay | Admitting: Physician Assistant

## 2017-02-11 ENCOUNTER — Other Ambulatory Visit: Payer: Self-pay | Admitting: Physician Assistant

## 2017-02-11 ENCOUNTER — Other Ambulatory Visit: Payer: Self-pay

## 2017-02-11 MED ORDER — ESCITALOPRAM OXALATE 10 MG PO TABS: 10.0000 mg | ORAL_TABLET | Freq: Every day | ORAL | 2 refills | Status: DC

## 2017-02-11 MED ORDER — ESCITALOPRAM OXALATE 20 MG PO TABS: 20.0000 mg | ORAL_TABLET | Freq: Every day | ORAL | 0 refills | Status: DC

## 2017-04-15 NOTE — Progress Notes (Addendum)
Complete Physical  Assessment and Plan:  Essential hypertension - continue medications, DASH diet, exercise and monitor at home. Call if greater than 130/80.  - CBC with Differential/Platelet - BASIC METABOLIC PANEL WITH GFR - Hepatic function panel - TSH - Urinalysis, Routine w reflex microscopic - Microalbumin / creatinine urine ratio - EKG 12-Lead   LBBB (left bundle branch block) WNL - EKG 12-Lead  Osteopenia, unspecified location Osteopenia- get dexa, continue Vit D and Ca, weight bearing exercises  Anxiety state continue medications, stress management techniques discussed, increase water, good sleep hygiene discussed, increase exercise, and increase veggies.  Try prozac  Malignant melanoma of torso excluding breast ([place]) Continue follow up oncology   Mixed hyperlipidemia -continue medications, check lipids, decrease fatty foods, increase activity.  - Lipid panel   Encounter for general adult medical examination with abnormal findings  Medication management - CBC with Differential/Platelet - BASIC METABOLIC PANEL WITH GFR - Hepatic function panel  Vitamin D deficiency   Other fatigue - Iron and TIBC - Vitamin B12  Tinnitus + fluid in her ears, no pain, no signs of infection Got hearing test at [facility] + fluids Will refer to ENT Call if any fever, chills, etc  Discussed med's effects and SE's. Screening labs and tests as requested with regular follow-up as recommended. Over 40 minutes of exam, counseling, chart review, and complex, high level critical decision making was performed this visit.   HPI  61 y.o. female  presents for a complete physical and follow up for has Essential hypertension; Anxiety state; Melanoma ([place]); Mixed hyperlipidemia; Osteopenia; and LBBB (left bundle branch block) on her problem list..  Her blood pressure has been controlled at home, today their BP is   She does not workout. She denies chest pain, shortness of breath, dizziness.   LBBB X 2003,  Normal stress test at that time.  She was started on lexapro last visit, has had ringing in her ears and is not helping as much as she would like, she states celexa caused her to feel edgy and have almost too  Much energy.  She is not on cholesterol medication and denies myalgias. Her cholesterol is at goal. The cholesterol last visit was:   Lab Results  Component Value Date   CHOL 189 01/04/2016   HDL 72 01/04/2016   LDLCALC 88 01/04/2016   TRIG 146 01/04/2016   CHOLHDL 2.6 01/04/2016   Last A1C in the office was:  Lab Results  Component Value Date   HGBA1C 5.5 01/04/2016   Last GFR: Lab Results  Component Value Date   GFRNONAA 79 01/04/2016   Patient is on Vitamin D supplement.   Lab Results  Component Value Date   VD25OH 45 01/04/2016     BMI is Body mass index is 27.65 kg/m., she is working on diet and exercise. Wt Readings from Last 3 Encounters:  04/16/17 163 lb 9.6 oz (74.2 kg)  12/18/16 164 lb (74.4 kg)  05/28/16 165 lb (74.8 kg)    Current Medications:  Current Outpatient Prescriptions on File Prior to Visit  Medication Sig Dispense Refill  . bisoprolol-hydrochlorothiazide (ZIAC) 5-6.25 MG tablet TAKE 1 TABLET BY MOUTH DAILY 30 tablet 0  . cholecalciferol (VITAMIN D) 1000 UNITS tablet Take 5,000 Units by mouth daily.     Marland Kitchen escitalopram (LEXAPRO) 20 MG tablet Take 1 tablet (20 mg total) by mouth daily. 30 tablet 0  . estradiol (ESTRACE) 0.5 MG tablet TAKE 1 TABLET BY MOUTH ONCE DAILY 30 tablet 0  .  Multiple Vitamin (MULTIVITAMIN) tablet Take 1 tablet by mouth daily.    . vitamin C (ASCORBIC ACID) 500 MG tablet Take 500 mg by mouth daily.     No current facility-administered medications on file prior to visit.    Allergies:  Allergies  Allergen Reactions  . Doxycycline Hives   Medical History:  She has Essential hypertension; Anxiety state; Melanoma ([place]); Mixed hyperlipidemia; Osteopenia; and LBBB (left bundle branch block) on her problem  list. Health Maintenance:   Immunization History  Administered Date(s) Administered  . Influenza-Unspecified 08/05/2013, 09/18/2014, 11/17/2016  . PPD Test 09/30/2014  . Td 08/30/2005    Tetanus: 2013 Pneumovax: Prevnar 13:  Flu vaccine: 2017 Zostavax: STATES SHE HAD IT HERE  LMP: s/p hysterectomy MGM:02/2016 SCREENING MGM 08/2016 DIAGNOSTIC MGM WITH [place] LEFT BREAST STABLE/BENIGN NODULE DEXA: 08/2016 Colonoscopy: 01/2014 EGD: N/A RAS NEG 2017 CXR 2014 Lumbar Xray 2014 CT head 2003 : Patient Care Team: Unk Pinto, MD as PCP - General (Internal Medicine) Servando Salina, MD as Consulting Physician (Obstetrics and Gynecology) Adrian Prows, MD as Consulting Physician (Cardiology) Lafayette Dragon, MD (Inactive) as Consulting Physician (Gastroenterology) Jari Pigg, MD as Consulting Physician (Dermatology) Jacelyn Pi, MD as Consulting Physician (Endocrinology)  Surgical History:  She has a past surgical history that includes Partial hysterectomy (30); Tonsillectomy; and Melanoma excision. Family History:  Herfamily history includes Cancer in her father, maternal grandmother, and paternal grandfather; Colon cancer in her maternal aunt; Heart disease in her paternal grandmother; Hypertension in her mother; Prostate cancer in her father; Stroke in her paternal grandmother. Social History:  She reports that she has never smoked. She has never used smokeless tobacco. She reports that she drinks about 4.2 oz of alcohol per week . She reports that she does not use drugs.  Review of Systems: Review of Systems  Constitutional: Positive for malaise/fatigue. Negative for chills, diaphoresis, fever and weight loss.  HENT: Negative.   Eyes: Negative.   Respiratory: Negative.   Cardiovascular: Negative.   Gastrointestinal: Negative.   Genitourinary: Negative.   Musculoskeletal: Positive for joint pain. Negative for back pain, falls, myalgias and neck pain.  Skin: Negative.    Neurological: Negative.  Negative for weakness.  Endo/Heme/Allergies: Negative.   Psychiatric/Behavioral: Negative.     Physical Exam: Estimated body mass index is 27.72 kg/m as calculated from the following:   Height as of 12/18/16: 5' 4.5" (1.638 m).   Weight as of 12/18/16: 164 lb (74.4 kg). There were no vitals taken for this visit. General Appearance: Well nourished, in no apparent distress.  Eyes: PERRLA, EOMs, conjunctiva no swelling or erythema, normal fundi and vessels.  Sinuses: No Frontal/maxillary tenderness  ENT/Mouth: Ext aud canals clear, normal light reflex with TMs without erythema + bilateral effusions without erythema, pus, bulging of TM. Good dentition. No erythema, swelling, or exudate on post pharynx. Tonsils not swollen or erythematous. Hearing decreased Neck: Supple, thyroid normal. No bruits  Respiratory: Respiratory effort normal, BS equal bilaterally without rales, rhonchi, wheezing or stridor.  Cardio: RRR without murmurs, rubs or gallops. Brisk peripheral pulses without edema.  Chest: symmetric, with normal excursions and percussion.  Breasts: Symmetric, without lumps, nipple discharge, retractions.  Abdomen: Soft, nontender, no guarding, rebound, hernias, masses, or organomegaly.  Lymphatics: Non tender without lymphadenopathy.  Genitourinary: defer Musculoskeletal: Full ROM all peripheral extremities,5/5 strength, and normal gait.  Skin: Warm, dry without rashes, lesions, ecchymosis. Neuro: Cranial nerves intact, reflexes equal bilaterally. Normal muscle tone, no cerebellar symptoms. Sensation intact.  Psych: Awake and  oriented X 3, normal affect, Insight and Judgment appropriate.   EKG: WNL no ST changes. AORTA SCAN: defer  Vicie Mutters 12:52 PM [facility]

## 2017-04-16 ENCOUNTER — Other Ambulatory Visit: Payer: Self-pay

## 2017-04-16 ENCOUNTER — Ambulatory Visit (INDEPENDENT_AMBULATORY_CARE_PROVIDER_SITE_OTHER): Payer: BLUE CROSS/BLUE SHIELD | Admitting: Physician Assistant

## 2017-04-16 ENCOUNTER — Encounter: Payer: Self-pay | Admitting: Physician Assistant

## 2017-04-16 VITALS — BP 118/74 | HR 61 | Temp 97.9°F | Resp 16 | Ht 64.5 in | Wt 163.6 lb

## 2017-04-16 DIAGNOSIS — C4359 Malignant melanoma of other part of trunk: Secondary | ICD-10-CM

## 2017-04-16 DIAGNOSIS — E559 Vitamin D deficiency, unspecified: Secondary | ICD-10-CM

## 2017-04-16 DIAGNOSIS — I10 Essential (primary) hypertension: Secondary | ICD-10-CM | POA: Diagnosis not present

## 2017-04-16 DIAGNOSIS — M8580 Other specified disorders of bone density and structure, unspecified site: Secondary | ICD-10-CM

## 2017-04-16 DIAGNOSIS — Z23 Encounter for immunization: Secondary | ICD-10-CM

## 2017-04-16 DIAGNOSIS — Z0001 Encounter for general adult medical examination with abnormal findings: Secondary | ICD-10-CM

## 2017-04-16 DIAGNOSIS — I447 Left bundle-branch block, unspecified: Secondary | ICD-10-CM | POA: Diagnosis not present

## 2017-04-16 DIAGNOSIS — R5383 Other fatigue: Secondary | ICD-10-CM

## 2017-04-16 DIAGNOSIS — E782 Mixed hyperlipidemia: Secondary | ICD-10-CM

## 2017-04-16 DIAGNOSIS — Z79899 Other long term (current) drug therapy: Secondary | ICD-10-CM

## 2017-04-16 DIAGNOSIS — H6593 Unspecified nonsuppurative otitis media, bilateral: Secondary | ICD-10-CM

## 2017-04-16 DIAGNOSIS — F411 Generalized anxiety disorder: Secondary | ICD-10-CM | POA: Diagnosis not present

## 2017-04-16 DIAGNOSIS — Z2911 Encounter for prophylactic immunotherapy for respiratory syncytial virus (RSV): Secondary | ICD-10-CM

## 2017-04-16 DIAGNOSIS — Z0000 Encounter for general adult medical examination without abnormal findings: Secondary | ICD-10-CM

## 2017-04-16 LAB — CBC WITH DIFFERENTIAL/PLATELET
Basophils Absolute: 0 cells/uL (ref 0–200)
Basophils Relative: 0 %
Eosinophils Absolute: 140 cells/uL (ref 15–500)
Eosinophils Relative: 2 %
HEMATOCRIT: 44.2 % (ref 35.0–45.0)
HEMOGLOBIN: 14.7 g/dL (ref 11.7–15.5)
LYMPHS ABS: 2240 {cells}/uL (ref 850–3900)
Lymphocytes Relative: 32 %
MCH: 30 pg (ref 27.0–33.0)
MCHC: 33.3 g/dL (ref 32.0–36.0)
MCV: 90.2 fL (ref 80.0–100.0)
MONO ABS: 630 {cells}/uL (ref 200–950)
MPV: 9.5 fL (ref 7.5–12.5)
Monocytes Relative: 9 %
NEUTROS PCT: 57 %
Neutro Abs: 3990 cells/uL (ref 1500–7800)
Platelets: 278 10*3/uL (ref 140–400)
RBC: 4.9 MIL/uL (ref 3.80–5.10)
RDW: 13.3 % (ref 11.0–15.0)
WBC: 7 10*3/uL (ref 3.8–10.8)

## 2017-04-16 LAB — TSH: TSH: 0.75 mIU/L

## 2017-04-16 MED ORDER — ESTRADIOL 0.5 MG PO TABS: 0.5000 mg | ORAL_TABLET | Freq: Every day | ORAL | 2 refills | Status: DC

## 2017-04-16 MED ORDER — FLUOXETINE HCL 20 MG PO CAPS: 20.0000 mg | ORAL_CAPSULE | Freq: Every day | ORAL | 0 refills | Status: DC

## 2017-04-16 MED ORDER — BISOPROLOL-HYDROCHLOROTHIAZIDE 5-6.25 MG PO TABS: 1.0000 | ORAL_TABLET | Freq: Every day | ORAL | 2 refills | Status: DC

## 2017-04-16 MED ORDER — BISOPROLOL-HYDROCHLOROTHIAZIDE 5-6.25 MG PO TABS: 1.0000 | ORAL_TABLET | Freq: Every day | ORAL | 1 refills | Status: DC

## 2017-04-16 MED ORDER — ESTRADIOL 0.5 MG PO TABS: 0.5000 mg | ORAL_TABLET | Freq: Every day | ORAL | 0 refills | Status: DC

## 2017-04-16 MED ORDER — ESTRADIOL 0.5 MG PO TABS: 0.5000 mg | ORAL_TABLET | Freq: Every day | ORAL | 1 refills | Status: DC

## 2017-04-16 MED ORDER — BISOPROLOL-HYDROCHLOROTHIAZIDE 5-6.25 MG PO TABS: 1.0000 | ORAL_TABLET | Freq: Every day | ORAL | 0 refills | Status: DC

## 2017-04-16 NOTE — Patient Instructions (Addendum)
Stop the lexapro, see if the ringing gets better if not here is info below and suggest hearing test  Your ears and sinuses are connected by the eustachian tube. When your sinuses are inflamed, this can close off the tube and cause fluid to collect in your middle ear. This can then cause dizziness, popping, clicking, ringing, and echoing in your ears. This is often NOT an infection and does NOT require antibiotics, it is caused by inflammation so the treatments help the inflammation. This can take a long time to get better so please be patient.  Here are things you can do to help with this: - Try the Flonase or Nasonex. Remember to spray each nostril twice towards the outer part of your eye.  Do not sniff but instead pinch your nose and tilt your head back to help the medicine get into your sinuses.  The best time to do this is at bedtime.Stop if you get blurred vision or nose bleeds.  -While drinking fluids, pinch and hold nose close and swallow, to help open eustachian tubes to drain fluid behind ear drums. -Please pick one of the over the counter allergy medications below and take it once daily for allergies.  It will also help with fluid behind ear drums. Claritin or loratadine cheapest but likely the weakest  Zyrtec or certizine at night because it can make you sleepy The strongest is allegra or fexafinadine  Cheapest at [facility], [facility], [employer] -can use decongestant over the counter, please do not use if you have high blood pressure or certain heart conditions.   if worsening HA, changes vision/speech, imbalance, weakness go to the ER   [employer] Test with no obligation # [phone] Do not have to be a member Tues-Sat 10-6  [place]- free test with no obligation # [phone] MUST BE A MEMBER Call for store hours  Have had patient's get good cheaper hearing aids from [employer] The air version has good reviews.   Once the ringing has stopped can try  prozac, PLEASE CALL IF ISSUES  What is the TMJ? The temporomandibular (tem-PUH-ro-man-DIB-yoo-ler) joint, or the TMJ, connects the upper and lower jawbones. This joint allows the jaw to open wide and move back and forth when you chew, talk, or yawn.There are also several muscles that help this joint move. There can be muscle tightness and pain in the muscle that can cause several symptoms.  What causes TMJ pain? There are many causes of TMJ pain. Repeated chewing (for example, chewing gum) and clenching your teeth can cause pain in the joint. Some TMJ pain has no obvious cause. What can I do to ease the pain? There are many things you can do to help your pain get better. When you have pain:  Eat soft foods and stay away from chewy foods (for example, taffy) Try to use both sides of your mouth to chew Don't chew gum Massage Don't open your mouth wide (for example, during yawning or singing) Don't bite your cheeks or fingernails Lower your amount of stress and worry Applying a warm, damp washcloth to the joint may help. Over-the-counter pain medicines such as ibuprofen (one brand: Advil) or acetaminophen (one brand: Tylenol) might also help. Do not use these medicines if you are allergic to them or if your doctor told you not to use them. How can I stop the pain from coming back? When your pain is better, you can do these exercises to make your muscles  stronger and to keep the pain from coming back:  Resisted mouth opening: Place your thumb or two fingers under your chin and open your mouth slowly, pushing up lightly on your chin with your thumb. Hold for three to six seconds. Close your mouth slowly. Resisted mouth closing: Place your thumbs under your chin and your two index fingers on the ridge between your mouth and the bottom of your chin. Push down lightly on your chin as you close your mouth. Tongue up: Slowly open and close your mouth while keeping the tongue touching the roof of the  mouth. Side-to-side jaw movement: Place an object about one fourth of an inch thick (for example, two tongue depressors) between your front teeth. Slowly move your jaw from side to side. Increase the thickness of the object as the exercise becomes easier Forward jaw movement: Place an object about one fourth of an inch thick between your front teeth and move the bottom jaw forward so that the bottom teeth are in front of the top teeth. Increase the thickness of the object as the exercise becomes easier. These exercises should not be painful. If it hurts to do these exercises, stop doing them and talk to your family doctor.

## 2017-04-16 NOTE — Progress Notes (Signed)
Checking on that tomorrow when Billing returns.

## 2017-04-17 LAB — BASIC METABOLIC PANEL WITH GFR
BUN: 11 mg/dL (ref 7–25)
CALCIUM: 9.4 mg/dL (ref 8.6–10.4)
CO2: 26 mmol/L (ref 20–31)
Chloride: 101 mmol/L (ref 98–110)
Creat: 0.68 mg/dL (ref 0.50–0.99)
GFR, Est African American: >89 mL/min (ref >=60)
GLUCOSE: 72 mg/dL (ref 65–99)
POTASSIUM: 4.3 mmol/L (ref 3.5–5.3)
Sodium: 139 mmol/L (ref 135–146)

## 2017-04-17 LAB — LIPID PANEL
Cholesterol: 191 mg/dL (ref <200)
HDL: 78 mg/dL (ref >50)
LDL CALC: 80 mg/dL (ref <100)
Total CHOL/HDL Ratio: 2.4 Ratio (ref <5.0)
Triglycerides: 163 mg/dL — ABNORMAL HIGH (ref <150)
VLDL: 33 mg/dL — AB (ref <30)

## 2017-04-17 LAB — HEPATIC FUNCTION PANEL
ALBUMIN: 4.1 g/dL (ref 3.6–5.1)
ALK PHOS: 62 U/L (ref 33–130)
ALT: 22 U/L (ref 6–29)
AST: 18 U/L (ref 10–35)
BILIRUBIN TOTAL: 0.4 mg/dL (ref 0.2–1.2)
Bilirubin, Direct: 0.1 mg/dL (ref <=0.2)
Indirect Bilirubin: 0.3 mg/dL (ref 0.2–1.2)
TOTAL PROTEIN: 6.5 g/dL (ref 6.1–8.1)

## 2017-04-17 LAB — IRON AND TIBC
%SAT: 31 % (ref 11–50)
IRON: 99 ug/dL (ref 45–160)
TIBC: 323 ug/dL (ref 250–450)
UIBC: 224 ug/dL

## 2017-04-17 LAB — MICROALBUMIN / CREATININE URINE RATIO
CREATININE, URINE: 17 mg/dL — AB (ref 20–320)
Microalb, Ur: <0.2 mg/dL

## 2017-04-17 LAB — VITAMIN B12: VITAMIN B 12: 448 pg/mL (ref 200–1100)

## 2017-04-17 LAB — URINALYSIS, ROUTINE W REFLEX MICROSCOPIC
BILIRUBIN URINE: NEGATIVE
Glucose, UA: NEGATIVE
HGB URINE DIPSTICK: NEGATIVE
Ketones, ur: NEGATIVE
Leukocytes, UA: NEGATIVE
Nitrite: NEGATIVE
PROTEIN: NEGATIVE
Specific Gravity, Urine: 1.005 (ref 1.001–1.035)
pH: 6.5 (ref 5.0–8.0)

## 2017-04-17 NOTE — Progress Notes (Signed)
Pt aware of lab results & voiced understanding of those results.

## 2017-04-17 NOTE — Addendum Note (Signed)
Addended by: Elsie Amis D on: 04/17/2017 03:17 PM   Modules accepted: Orders

## 2017-05-05 ENCOUNTER — Telehealth: Payer: Self-pay | Admitting: Internal Medicine

## 2017-05-05 ENCOUNTER — Other Ambulatory Visit: Payer: Self-pay | Admitting: Physician Assistant

## 2017-05-05 DIAGNOSIS — G4700 Insomnia, unspecified: Secondary | ICD-10-CM

## 2017-05-05 MED ORDER — ALPRAZOLAM 1 MG PO TABS: ORAL_TABLET | ORAL | 0 refills | Status: AC

## 2017-05-05 NOTE — Telephone Encounter (Signed)
Patient called requesting Alprazolam rx called to [facility]. She states this pharmacy is closing Wednesday, and would like it filled before then. Asked if they are still accepting refills, she thinks so.

## 2017-05-05 NOTE — Progress Notes (Signed)
Xanax was called into pharmacy on 16th July 2018 @ 5:28pm by dd

## 2017-05-05 NOTE — Telephone Encounter (Signed)
Message sent to provider in order to get the ok or not to call in xanax.

## 2017-05-14 ENCOUNTER — Other Ambulatory Visit: Payer: Self-pay

## 2017-05-14 ENCOUNTER — Other Ambulatory Visit: Payer: Self-pay | Admitting: Physician Assistant

## 2017-05-14 DIAGNOSIS — N644 Mastodynia: Secondary | ICD-10-CM

## 2017-05-14 DIAGNOSIS — N6320 Unspecified lump in the left breast, unspecified quadrant: Secondary | ICD-10-CM

## 2017-05-29 DIAGNOSIS — H524 Presbyopia: Secondary | ICD-10-CM | POA: Diagnosis not present

## 2017-05-29 DIAGNOSIS — T1511XA Foreign body in conjunctival sac, right eye, initial encounter: Secondary | ICD-10-CM | POA: Diagnosis not present

## 2017-05-29 DIAGNOSIS — H04123 Dry eye syndrome of bilateral lacrimal glands: Secondary | ICD-10-CM | POA: Diagnosis not present

## 2017-05-29 DIAGNOSIS — H16103 Unspecified superficial keratitis, bilateral: Secondary | ICD-10-CM | POA: Diagnosis not present

## 2017-05-29 NOTE — Addendum Note (Signed)
Addended by: Vicie Mutters R on: 05/29/2017 02:25 PM   Modules accepted: Orders

## 2017-06-02 ENCOUNTER — Encounter (INDEPENDENT_AMBULATORY_CARE_PROVIDER_SITE_OTHER): Payer: Self-pay

## 2017-07-10 DIAGNOSIS — H9313 Tinnitus, bilateral: Secondary | ICD-10-CM | POA: Diagnosis not present

## 2017-07-10 DIAGNOSIS — J343 Hypertrophy of nasal turbinates: Secondary | ICD-10-CM | POA: Diagnosis not present

## 2017-07-15 ENCOUNTER — Other Ambulatory Visit: Payer: Self-pay | Admitting: Physician Assistant

## 2017-07-15 DIAGNOSIS — N6320 Unspecified lump in the left breast, unspecified quadrant: Secondary | ICD-10-CM

## 2017-07-18 ENCOUNTER — Other Ambulatory Visit: Payer: Self-pay

## 2017-07-23 ENCOUNTER — Ambulatory Visit
Admission: RE | Admit: 2017-07-23 | Discharge: 2017-07-23 | Disposition: A | Discharge status: Home / Self Care | Payer: BLUE CROSS/BLUE SHIELD | Source: Ambulatory Visit | Attending: Physician Assistant | Admitting: Physician Assistant

## 2017-07-23 DIAGNOSIS — N6489 Other specified disorders of breast: Secondary | ICD-10-CM | POA: Diagnosis not present

## 2017-07-23 DIAGNOSIS — N6320 Unspecified lump in the left breast, unspecified quadrant: Secondary | ICD-10-CM

## 2017-07-23 DIAGNOSIS — R928 Other abnormal and inconclusive findings on diagnostic imaging of breast: Secondary | ICD-10-CM | POA: Diagnosis not present

## 2017-09-09 ENCOUNTER — Other Ambulatory Visit: Payer: Self-pay | Admitting: Physician Assistant

## 2017-09-09 MED ORDER — ESCITALOPRAM OXALATE 20 MG PO TABS: 20.0000 mg | ORAL_TABLET | Freq: Every day | ORAL | 2 refills | Status: DC

## 2017-09-18 ENCOUNTER — Ambulatory Visit (INDEPENDENT_AMBULATORY_CARE_PROVIDER_SITE_OTHER): Payer: BLUE CROSS/BLUE SHIELD

## 2017-09-18 DIAGNOSIS — Z23 Encounter for immunization: Secondary | ICD-10-CM

## 2017-10-02 DIAGNOSIS — L821 Other seborrheic keratosis: Secondary | ICD-10-CM | POA: Diagnosis not present

## 2017-10-02 DIAGNOSIS — L708 Other acne: Secondary | ICD-10-CM | POA: Diagnosis not present

## 2017-10-02 DIAGNOSIS — D485 Neoplasm of uncertain behavior of skin: Secondary | ICD-10-CM | POA: Diagnosis not present

## 2017-10-02 DIAGNOSIS — D2261 Melanocytic nevi of right upper limb, including shoulder: Secondary | ICD-10-CM | POA: Diagnosis not present

## 2017-10-02 DIAGNOSIS — D2272 Melanocytic nevi of left lower limb, including hip: Secondary | ICD-10-CM | POA: Diagnosis not present

## 2017-10-09 ENCOUNTER — Encounter: Payer: Self-pay | Admitting: Physician Assistant

## 2017-10-09 ENCOUNTER — Ambulatory Visit (INDEPENDENT_AMBULATORY_CARE_PROVIDER_SITE_OTHER): Payer: BLUE CROSS/BLUE SHIELD | Admitting: Physician Assistant

## 2017-10-09 VITALS — BP 138/76 | Temp 97.4°F | Resp 16 | Ht 64.5 in | Wt 163.4 lb

## 2017-10-09 DIAGNOSIS — E782 Mixed hyperlipidemia: Secondary | ICD-10-CM

## 2017-10-09 DIAGNOSIS — Z79899 Other long term (current) drug therapy: Secondary | ICD-10-CM | POA: Diagnosis not present

## 2017-10-09 DIAGNOSIS — L821 Other seborrheic keratosis: Secondary | ICD-10-CM | POA: Diagnosis not present

## 2017-10-09 DIAGNOSIS — F411 Generalized anxiety disorder: Secondary | ICD-10-CM | POA: Diagnosis not present

## 2017-10-09 DIAGNOSIS — I10 Essential (primary) hypertension: Secondary | ICD-10-CM

## 2017-10-09 MED ORDER — ESCITALOPRAM OXALATE 20 MG PO TABS: 20.0000 mg | ORAL_TABLET | Freq: Every day | ORAL | 1 refills | Status: DC

## 2017-10-09 MED ORDER — BISOPROLOL-HYDROCHLOROTHIAZIDE 5-6.25 MG PO TABS: 1.0000 | ORAL_TABLET | Freq: Every day | ORAL | 1 refills | Status: DC

## 2017-10-09 MED ORDER — ESTRADIOL 0.5 MG PO TABS
0.5000 mg | ORAL_TABLET | Freq: Every day | ORAL | 1 refills | Status: DC
Start: 2017-10-09 — End: 2017-10-10

## 2017-10-09 NOTE — Progress Notes (Signed)
Assessment and Plan:   Essential hypertension - continue medications, DASH diet, exercise and monitor at home. Call if greater than 130/80.  -     bisoprolol-hydrochlorothiazide (ZIAC) 5-6.25 MG tablet; Take 1 tablet by mouth daily.  Mixed hyperlipidemia -continue medications, check lipids, decrease fatty foods, increase activity.   Medication management Continue meds, declines labs this visit.  Follow up 6 months  Seborrheic keratosis After verbal permission, 3 freeze and thaw technique Tolerated well Can use vaseline, stay out of sun, call if any infections symptoms  Anxiety state -     estradiol (ESTRACE) 0.5 MG tablet; Take 1 tablet (0.5 mg total) by mouth daily. -     escitalopram (LEXAPRO) 20 MG tablet; Take 1 tablet (20 mg total) by mouth daily.   Continue diet and meds as discussed. Further disposition pending results of labs. Over 30 minutes of exam, counseling, chart review, and critical decision making was performed  Future Appointments  Date Time Provider [place]  04/20/2018  3:00 PM Vicie Mutters, PA-C [id number] None     HPI 61 y.o. female  presents for 6 month follow up on hypertension, cholesterol, prediabetes, and vitamin D deficiency.   Her blood pressure has been controlled at home, she is on ziac 5mg  1/2 a tablet, today their BP is BP: 138/76  She does workout. She denies chest pain, shortness of breath, dizziness.  She is not on cholesterol medication and denies myalgias. Her cholesterol is at goal. The cholesterol last visit was:   Lab Results  Component Value Date   CHOL 191 04/16/2017   HDL 78 04/16/2017   LDLCALC 80 04/16/2017   TRIG 163 (H) 04/16/2017   CHOLHDL 2.4 04/16/2017   Last A1C in the office was:  Lab Results  Component Value Date   HGBA1C 5.5 01/04/2016   Patient is on Vitamin D supplement, 5000 IU daily.   Lab Results  Component Value Date   VD25OH 45 01/04/2016     She has history of anxiety, she is on lexapro,  takes xanax very rarely, will take 1 pill 1-2 x a week.   She is on estrogen due to menopausal sympome from her OB/GYN.  Lab Results  Component Value Date   ALT 22 04/16/2017   AST 18 04/16/2017   ALKPHOS 62 04/16/2017   BILITOT 0.4 04/16/2017   BMI is Body mass index is 27.61 kg/m., she is working on diet and exercise. Wt Readings from Last 3 Encounters:  10/09/17 163 lb 6.4 oz (74.1 kg)  04/16/17 163 lb 9.6 oz (74.2 kg)  12/18/16 164 lb (74.4 kg)    Current Medications:  Current Outpatient Medications on File Prior to Visit  Medication Sig Dispense Refill  . cholecalciferol (VITAMIN D) 1000 UNITS tablet Take 5,000 Units by mouth daily.     . Multiple Vitamin (MULTIVITAMIN) tablet Take 1 tablet by mouth daily.    . vitamin C (ASCORBIC ACID) 500 MG tablet Take 500 mg by mouth daily.     No current facility-administered medications on file prior to visit.    Medical History:  Past Medical History:  Diagnosis Date  . Anxiety   . Bundle, branch block, left   . Hyperlipidemia   . Hypertension   . Melanoma ([place]) 2012   chest    Review of Systems:  Review of Systems  Constitutional: Negative for chills, diaphoresis, fever, malaise/fatigue and weight loss.  HENT: Negative.   Eyes: Negative.   Respiratory: Negative.   Cardiovascular: Negative.  Gastrointestinal: Negative.   Genitourinary: Negative.   Musculoskeletal: Positive for joint pain. Negative for back pain, falls, myalgias and neck pain.  Skin: Positive for itching and rash.  Neurological: Negative.  Negative for weakness.  Endo/Heme/Allergies: Negative.   Psychiatric/Behavioral: Negative.    Allergies Allergies  Allergen Reactions  . Doxycycline Hives   Surgical History: reviewed and unchanged Family History: reviewed and unchanged Social History: reviewed and unchanged  Physical Exam: BP 138/76   Temp (!) 97.4 F (36.3 C)   Resp 16   Ht 5' 4.5" (1.638 m)   Wt 163 lb 6.4 oz (74.1 kg)   SpO2 98%    BMI 27.61 kg/m  Wt Readings from Last 3 Encounters:  10/09/17 163 lb 6.4 oz (74.1 kg)  04/16/17 163 lb 9.6 oz (74.2 kg)  12/18/16 164 lb (74.4 kg)   General Appearance: Well nourished, in no apparent distress. Eyes: PERRLA, EOMs, conjunctiva no swelling or erythema Sinuses: No Frontal/maxillary tenderness ENT/Mouth: Ext aud canals clear, TMs without erythema, bulging. No erythema, swelling, or exudate on post pharynx.  Tonsils not swollen or erythematous. Hearing normal.  Neck: Supple, thyroid normal.  Respiratory: Respiratory effort normal, BS equal bilaterally without rales, rhonchi, wheezing or stridor.  Cardio: RRR with no MRGs. Brisk peripheral pulses without edema.  Abdomen: Soft, + BS,  Non tender, no guarding, rebound, hernias, masses. Lymphatics: Non tender without lymphadenopathy.  Musculoskeletal: Full ROM, 5/5 strength, Normal gait Skin: .3 freeze and thaw technique used on several seb dermatitis on chest, neck, and back, well over 15. Warm, dry without rashes, lesions, ecchymosis.  Neuro: Cranial nerves intact. Normal muscle tone, no cerebellar symptoms. Psych: Awake and oriented X 3, normal affect, Insight and Judgment appropriate.   Vicie Mutters, PA-C 5:18 PM [facility]

## 2017-10-10 ENCOUNTER — Other Ambulatory Visit: Payer: Self-pay | Admitting: Physician Assistant

## 2017-10-10 DIAGNOSIS — F411 Generalized anxiety disorder: Secondary | ICD-10-CM

## 2017-10-10 DIAGNOSIS — I10 Essential (primary) hypertension: Secondary | ICD-10-CM

## 2017-10-10 MED ORDER — BISOPROLOL-HYDROCHLOROTHIAZIDE 5-6.25 MG PO TABS: 1.0000 | ORAL_TABLET | Freq: Every day | ORAL | 1 refills | Status: DC

## 2017-10-10 MED ORDER — ESTRADIOL 0.5 MG PO TABS
0.5000 mg | ORAL_TABLET | Freq: Every day | ORAL | 1 refills | Status: DC
Start: 2017-10-10 — End: 2018-12-19

## 2017-10-17 ENCOUNTER — Ambulatory Visit: Payer: Self-pay | Admitting: Adult Health

## 2017-10-29 ENCOUNTER — Ambulatory Visit: Payer: Self-pay | Admitting: Physician Assistant

## 2017-11-27 DIAGNOSIS — D485 Neoplasm of uncertain behavior of skin: Secondary | ICD-10-CM | POA: Diagnosis not present

## 2017-11-27 DIAGNOSIS — L988 Other specified disorders of the skin and subcutaneous tissue: Secondary | ICD-10-CM | POA: Diagnosis not present

## 2018-01-28 ENCOUNTER — Other Ambulatory Visit: Payer: Self-pay | Admitting: Adult Health

## 2018-01-28 DIAGNOSIS — G4700 Insomnia, unspecified: Secondary | ICD-10-CM

## 2018-01-28 MED ORDER — ALPRAZOLAM 1 MG PO TABS: ORAL_TABLET | ORAL | 0 refills | Status: DC

## 2018-04-20 ENCOUNTER — Encounter: Payer: Self-pay | Admitting: Physician Assistant

## 2018-06-08 ENCOUNTER — Other Ambulatory Visit: Payer: Self-pay | Admitting: Adult Health

## 2018-06-08 DIAGNOSIS — G4700 Insomnia, unspecified: Secondary | ICD-10-CM

## 2018-06-10 ENCOUNTER — Encounter: Payer: Self-pay | Admitting: Physician Assistant

## 2018-08-31 NOTE — Progress Notes (Signed)
Complete Physical  Assessment and Plan:  Essential hypertension - continue medications, DASH diet, exercise and monitor at home. Call if greater than 130/80.  - CBC with Differential/Platelet - BASIC METABOLIC PANEL WITH GFR - Hepatic function panel - TSH - Urinalysis, Routine w reflex microscopic - Microalbumin / creatinine urine ratio - EKG 12-Lead   LBBB (left bundle branch block) WNL - EKG 12-Lead  Osteopenia, unspecified location Osteopenia- get dexa next year, continue Vit D and Ca, weight bearing exercises  Anxiety state continue medications, stress management techniques discussed, increase water, good sleep hygiene discussed, increase exercise, and increase veggies.  Will try trintellix, 1.5 month of samples given  Malignant melanoma of torso excluding breast ([place]) Continue follow up oncology   Mixed hyperlipidemia -continue medications, check lipids, decrease fatty foods, increase activity.  - Lipid panel   Encounter for general adult medical examination with abnormal findings 1 year  Medication management - CBC with Differential/Platelet - BASIC METABOLIC PANEL WITH GFR - Hepatic function panel  Vitamin D deficiency  Fatigue Has long history of fatigue, discussed possible sleep study but declines OSA symptoms at this time, will switch lexapro to trintellix, stop BB and try ARB Follow up Jan Will call 1 month, declines visit due to insurance, will get new on in Jan  Discussed med's effects and SE's. Screening labs and tests as requested with regular follow-up as recommended. Over 40 minutes of exam, counseling, chart review, and complex, high level critical decision making was performed this visit.   HPI  62 y.o. female  presents for a complete physical and follow up for has Essential hypertension; Anxiety state; Melanoma ([place]); Mixed hyperlipidemia; Osteopenia; and LBBB (left bundle branch block) on their problem list..  She has chronic fatigue, has been  on several anxiety medications with out help, she is sleeping well but does not feel rested in the AM still and later will occ have waves of fatigue. She states she has taken her pulse and it has been low. She has lots of stress, feels overwhelmed, brother with stroke and mom with dementia.  She was started on lexapro last visit and is still on it, she states celexa caused her to feel edgy and have almost too, has done wellbutrin and prozac in the past.   She has seen ENT for tinnitus and decreased hearing, had normal exam, no fluid.   Her blood pressure has been controlled at home, today their BP is BP: 122/68 She does not workout. She denies chest pain, shortness of breath, dizziness.  LBBB X 2003,  Normal stress test at that time.   She is not on cholesterol medication and denies myalgias. Her cholesterol is at goal. The cholesterol last visit was:   Lab Results  Component Value Date   CHOL 191 04/16/2017   HDL 78 04/16/2017   LDLCALC 80 04/16/2017   TRIG 163 (H) 04/16/2017   CHOLHDL 2.4 04/16/2017   Last A1C in the office was:  Lab Results  Component Value Date   HGBA1C 5.5 01/04/2016   Last GFR: Lab Results  Component Value Date   GFRNONAA >89 04/16/2017   Patient is on Vitamin D supplement.   Lab Results  Component Value Date   VD25OH 45 01/04/2016     BMI is Body mass index is 28.42 kg/m., she is working on diet and exercise. Wt Readings from Last 3 Encounters:  09/03/18 170 lb 12.8 oz (77.5 kg)  10/09/17 163 lb 6.4 oz (74.1 kg)  04/16/17 163  lb 9.6 oz (74.2 kg)    Current Medications:  Current Outpatient Medications on File Prior to Visit  Medication Sig Dispense Refill  . ALPRAZolam (XANAX) 1 MG tablet TAKE 1/2 TO 1 TABLET BY MOUTH WITHIN 1 HOUR OF SLEEP AS NEEDED 30 tablet 0  . bisoprolol-hydrochlorothiazide (ZIAC) 5-6.25 MG tablet Take 1 tablet by mouth daily. 90 tablet 1  . cholecalciferol (VITAMIN D) 1000 UNITS tablet Take 5,000 Units by mouth daily.     Marland Kitchen  escitalopram (LEXAPRO) 20 MG tablet Take 1 tablet (20 mg total) by mouth daily. 90 tablet 1  . estradiol (ESTRACE) 0.5 MG tablet Take 1 tablet (0.5 mg total) by mouth daily. 90 tablet 1  . Multiple Vitamin (MULTIVITAMIN) tablet Take 1 tablet by mouth daily.    . vitamin C (ASCORBIC ACID) 500 MG tablet Take 500 mg by mouth daily.     No current facility-administered medications on file prior to visit.    Allergies:  Allergies  Allergen Reactions  . Doxycycline Hives   Medical History:  She has Essential hypertension; Anxiety state; Melanoma ([place]); Mixed hyperlipidemia; Osteopenia; and LBBB (left bundle branch block) on their problem list. Health Maintenance:   Immunization History  Administered Date(s) Administered  . Influenza Inj Mdck Quad With Preservative 09/18/2017, 09/03/2018  . Influenza-Unspecified 08/05/2013, 09/18/2014, 11/17/2016  . PPD Test 09/30/2014  . Td 08/30/2005  . Zoster 12/18/2016    Tetanus: 2013 Pneumovax: Prevnar 13:  Flu vaccine: 2019 Zostavax: STATES SHE HAD IT HERE  LMP: s/p hysterectomy MGM:07/2017   SCREENING MGM 08/2016 DIAGNOSTIC MGM WITH [place] LEFT BREAST STABLE/BENIGN NODULE  DEXA: 08/2016 Colonoscopy: 01/2014 EGD: N/A RAS NEG 2017 CXR 2014 Lumbar Xray 2014 CT head 2003 : Patient Care Team: Unk Pinto, MD as PCP - General (Internal Medicine) Servando Salina, MD as Consulting Physician (Obstetrics and Gynecology) Adrian Prows, MD as Consulting Physician (Cardiology) Lafayette Dragon, MD (Inactive) as Consulting Physician (Gastroenterology) Jari Pigg, MD as Consulting Physician (Dermatology) Jacelyn Pi, MD as Consulting Physician (Endocrinology)  Surgical History:  She has a past surgical history that includes Partial hysterectomy (30); Tonsillectomy; and Melanoma excision. Family History:  Herfamily history includes Cancer in her father, maternal grandmother, and paternal grandfather; Colon cancer in her maternal aunt;  Heart disease in her paternal grandmother; Hypertension in her mother; Prostate cancer in her father; Stroke in her paternal grandmother. Social History:  She reports that she has never smoked. She has never used smokeless tobacco. She reports that she drinks about 7.0 standard drinks of alcohol per week. She reports that she does not use drugs.  Review of Systems: Review of Systems  Constitutional: Positive for malaise/fatigue. Negative for chills, diaphoresis, fever and weight loss.  HENT: Negative.   Eyes: Negative.   Respiratory: Negative.   Cardiovascular: Negative.   Gastrointestinal: Negative.   Genitourinary: Negative.   Musculoskeletal: Positive for joint pain. Negative for back pain, falls, myalgias and neck pain.  Skin: Negative.   Neurological: Negative.  Negative for weakness.  Endo/Heme/Allergies: Negative.   Psychiatric/Behavioral: Negative.    Right antier ankle Physical Exam: Estimated body mass index is 28.42 kg/m as calculated from the following:   Height as of this encounter: 5\' 5"  (1.651 m).   Weight as of this encounter: 170 lb 12.8 oz (77.5 kg). BP 122/68   Pulse 61   Temp 98 F (36.7 C)   Ht 5\' 5"  (1.651 m) Comment: W/ SHOES  Wt 170 lb 12.8 oz (77.5 kg)   SpO2 98%  BMI 28.42 kg/m  General Appearance: Well nourished, in no apparent distress.  Eyes: PERRLA, EOMs, conjunctiva no swelling or erythema, normal fundi and vessels.  Sinuses: No Frontal/maxillary tenderness  ENT/Mouth: Ext aud canals clear, normal light reflex with TMs without erythema + bilateral effusions without erythema, pus, bulging of TM. Good dentition. No erythema, swelling, or exudate on post pharynx. Tonsils not swollen or erythematous. Hearing decreased Neck: Supple, thyroid normal. No bruits  Respiratory: Respiratory effort normal, BS equal bilaterally without rales, rhonchi, wheezing or stridor.  Cardio: RRR without murmurs, rubs or gallops. Brisk peripheral pulses without edema.   Chest: symmetric, with normal excursions and percussion.  Breasts: defer Abdomen: Soft, nontender, no guarding, rebound, hernias, masses, or organomegaly.  Lymphatics: Non tender without lymphadenopathy.  Genitourinary: defer Musculoskeletal: Full ROM all peripheral extremities,5/5 strength, and normal gait.  Skin: Warm, dry without rashes, lesions, ecchymosis. Neuro: Cranial nerves intact, reflexes equal bilaterally. Normal muscle tone, no cerebellar symptoms. Sensation intact.  Psych: Awake and oriented X 3, normal affect, Insight and Judgment appropriate.   EKG: WNL no ST changes. AORTA SCAN: defer  Vicie Mutters 2:23 PM [facility]

## 2018-09-03 ENCOUNTER — Encounter: Payer: Self-pay | Admitting: Physician Assistant

## 2018-09-03 ENCOUNTER — Ambulatory Visit: Payer: BLUE CROSS/BLUE SHIELD | Admitting: Physician Assistant

## 2018-09-03 VITALS — BP 122/68 | HR 61 | Temp 98.0°F | Ht 65.0 in | Wt 170.8 lb

## 2018-09-03 DIAGNOSIS — Z0001 Encounter for general adult medical examination with abnormal findings: Secondary | ICD-10-CM

## 2018-09-03 DIAGNOSIS — Z1389 Encounter for screening for other disorder: Secondary | ICD-10-CM

## 2018-09-03 DIAGNOSIS — Z136 Encounter for screening for cardiovascular disorders: Secondary | ICD-10-CM | POA: Diagnosis not present

## 2018-09-03 DIAGNOSIS — Z1329 Encounter for screening for other suspected endocrine disorder: Secondary | ICD-10-CM | POA: Diagnosis not present

## 2018-09-03 DIAGNOSIS — E782 Mixed hyperlipidemia: Secondary | ICD-10-CM

## 2018-09-03 DIAGNOSIS — I10 Essential (primary) hypertension: Secondary | ICD-10-CM

## 2018-09-03 DIAGNOSIS — Z23 Encounter for immunization: Secondary | ICD-10-CM | POA: Diagnosis not present

## 2018-09-03 DIAGNOSIS — Z79899 Other long term (current) drug therapy: Secondary | ICD-10-CM

## 2018-09-03 DIAGNOSIS — Z0000 Encounter for general adult medical examination without abnormal findings: Secondary | ICD-10-CM | POA: Diagnosis not present

## 2018-09-03 DIAGNOSIS — R5383 Other fatigue: Secondary | ICD-10-CM | POA: Diagnosis not present

## 2018-09-03 DIAGNOSIS — C4359 Malignant melanoma of other part of trunk: Secondary | ICD-10-CM

## 2018-09-03 DIAGNOSIS — I447 Left bundle-branch block, unspecified: Secondary | ICD-10-CM

## 2018-09-03 DIAGNOSIS — Z13220 Encounter for screening for lipoid disorders: Secondary | ICD-10-CM | POA: Diagnosis not present

## 2018-09-03 DIAGNOSIS — E559 Vitamin D deficiency, unspecified: Secondary | ICD-10-CM | POA: Diagnosis not present

## 2018-09-03 DIAGNOSIS — F411 Generalized anxiety disorder: Secondary | ICD-10-CM

## 2018-09-03 DIAGNOSIS — M8580 Other specified disorders of bone density and structure, unspecified site: Secondary | ICD-10-CM

## 2018-09-03 MED ORDER — LOSARTAN POTASSIUM 100 MG PO TABS: 100.0000 mg | ORAL_TABLET | Freq: Every day | ORAL | 11 refills | Status: DC

## 2018-09-03 NOTE — Patient Instructions (Addendum)
HOW TO SCHEDULE A MAMMOGRAM  [facility]  7 a.m.-6:30 p.m., Monday 7 a.m.-5 p.m., Tuesday-Friday Schedule an appointment by calling [phone].  [facility] Schedule an appointment by calling [phone].  Encourage you to get the 3D Mammogram  The 3D Mammogram is much more specific and sensitive to pick up breast cancer. For women with fibrocystic breast or lumpy breast it can be hard to determine if it is cancer or not but the 3D mammogram is able to tell this difference which cuts back on unneeded additional tests or scary call backs.   - over 40% increase in detection of breast cancer - over 40% reduction in false positives.  - fewer call backs - reduced anxiety - improved outcomes - [facility]  Add ENTERIC COATED low dose 81 mg Aspirin daily OR can do every other day if you have easy bruising to protect your heart and head. As well as to reduce risk of Colon Cancer by 20 %, Skin Cancer by 26 % , Melanoma by 46% and Pancreatic cancer by 60%  Can taper estrogen do every other day for 3 months and then stop If you stop it can think about stopping the bASA  Stop the ziac and start the losartan 100mg - start 1/2 pill a day and then can increase to 1 pill a day depending on your BP see below  Start on trintellix 5 mg every day for 2 weeks with the 1/2 of the lexapro Then stop the lexapro and go up to trintellix 10 mg Follow up 1 month  Do we need a sleep study Fatigue Fatigue is feeling tired all of the time, a lack of energy, or a lack of motivation. Occasional or mild fatigue is often a normal response to activity or life in general. However, long-lasting (chronic) or extreme fatigue may indicate an underlying medical condition. Follow these instructions at home: Watch your fatigue for any changes. The following actions may help to lessen any discomfort you are feeling:  Talk to your health care provider about how much sleep you need  each night. Try to get the required amount every night.  Take medicines only as directed by your health care provider.  Eat a healthy and nutritious diet. Ask your health care provider if you need help changing your diet.  Drink enough fluid to keep your urine clear or pale yellow.  Practice ways of relaxing, such as yoga, meditation, massage therapy, or acupuncture.  Exercise regularly.  Change situations that cause you stress. Try to keep your work and personal routine reasonable.  Do not abuse illegal drugs.  Limit alcohol intake to no more than 1 drink per day for nonpregnant women and 2 drinks per day for men. One drink equals 12 ounces of beer, 5 ounces of wine, or 1 ounces of hard liquor.  Take a multivitamin, if directed by your health care provider.  Contact a health care provider if:  Your fatigue does not get better.  You have a fever.  You have unintentional weight loss or gain.  You have headaches.  You have difficulty: ? Falling asleep. ? Sleeping throughout the night.  You feel angry, guilty, anxious, or sad.  You are unable to have a bowel movement (constipation).  You skin is dry.  Your legs or another part of your body is swollen. Get help right away if:  You feel confused.  Your vision is blurry.  You feel faint or pass out.  You have a severe headache.  You have severe abdominal, pelvic, or back pain.  You have chest pain, shortness of breath, or an irregular or fast heartbeat.  You are unable to urinate or you urinate less than normal.  You develop abnormal bleeding, such as bleeding from the rectum, vagina, nose, lungs, or nipples.  You vomit blood.  You have thoughts about harming yourself or committing suicide.  You are worried that you might harm someone else. This information is not intended to replace advice given to you by your health care provider. Make sure you discuss any questions you have with your health care  provider. Document Released: 08/04/2007 Document Revised: 03/14/2016 Document Reviewed: 02/08/2014 Elsevier Interactive Patient Education  [employer].

## 2018-09-04 LAB — URINALYSIS, ROUTINE W REFLEX MICROSCOPIC
Bilirubin Urine: NEGATIVE
Glucose, UA: NEGATIVE
HGB URINE DIPSTICK: NEGATIVE
Ketones, ur: NEGATIVE
Leukocytes, UA: NEGATIVE
Nitrite: NEGATIVE
PROTEIN: NEGATIVE
SPECIFIC GRAVITY, URINE: 1.003 (ref 1.001–1.03)
pH: 6.5 (ref 5.0–8.0)

## 2018-09-04 LAB — COMPLETE METABOLIC PANEL WITH GFR
AG Ratio: 1.7 (calc) (ref 1.0–2.5)
ALKALINE PHOSPHATASE (APISO): 64 U/L (ref 33–130)
ALT: 27 U/L (ref 6–29)
AST: 20 U/L (ref 10–35)
Albumin: 4.3 g/dL (ref 3.6–5.1)
BILIRUBIN TOTAL: 0.4 mg/dL (ref 0.2–1.2)
BUN: 14 mg/dL (ref 7–25)
CHLORIDE: 97 mmol/L — AB (ref 98–110)
CO2: 30 mmol/L (ref 20–32)
Calcium: 9.9 mg/dL (ref 8.6–10.4)
Creat: 0.69 mg/dL (ref 0.50–0.99)
GFR, Est African American: 108 mL/min/{1.73_m2} (ref >=60)
GFR, Est Non African American: 93 mL/min/{1.73_m2} (ref >=60)
GLUCOSE: 85 mg/dL (ref 65–99)
Globulin: 2.6 g/dL (calc) (ref 1.9–3.7)
Potassium: 4.3 mmol/L (ref 3.5–5.3)
Sodium: 136 mmol/L (ref 135–146)
Total Protein: 6.9 g/dL (ref 6.1–8.1)

## 2018-09-04 LAB — CBC WITH DIFFERENTIAL/PLATELET
Basophils Absolute: 37 cells/uL (ref 0–200)
Basophils Relative: 0.4 %
EOS PCT: 0.8 %
Eosinophils Absolute: 74 cells/uL (ref 15–500)
HEMATOCRIT: 45 % (ref 35.0–45.0)
HEMOGLOBIN: 15.3 g/dL (ref 11.7–15.5)
LYMPHS ABS: 1823 {cells}/uL (ref 850–3900)
MCH: 29.9 pg (ref 27.0–33.0)
MCHC: 34 g/dL (ref 32.0–36.0)
MCV: 88.1 fL (ref 80.0–100.0)
MPV: 10 fL (ref 7.5–12.5)
Monocytes Relative: 6.6 %
NEUTROS ABS: 6752 {cells}/uL (ref 1500–7800)
Neutrophils Relative %: 72.6 %
Platelets: 326 10*3/uL (ref 140–400)
RBC: 5.11 10*6/uL — AB (ref 3.80–5.10)
RDW: 11.9 % (ref 11.0–15.0)
Total Lymphocyte: 19.6 %
WBC mixed population: 614 cells/uL (ref 200–950)
WBC: 9.3 10*3/uL (ref 3.8–10.8)

## 2018-09-04 LAB — MICROALBUMIN / CREATININE URINE RATIO
Creatinine, Urine: 10 mg/dL — ABNORMAL LOW (ref 20–275)
Microalb, Ur: <0.2 mg/dL

## 2018-09-04 LAB — TSH: TSH: 0.68 m[IU]/L (ref 0.40–4.50)

## 2018-09-04 LAB — LIPID PANEL
CHOLESTEROL: 215 mg/dL — AB (ref <200)
HDL: 85 mg/dL (ref >50)
LDL Cholesterol (Calc): 102 mg/dL (calc) — ABNORMAL HIGH
Non-HDL Cholesterol (Calc): 130 mg/dL (calc) — ABNORMAL HIGH (ref <130)
TRIGLYCERIDES: 168 mg/dL — AB (ref <150)
Total CHOL/HDL Ratio: 2.5 (calc) (ref <5.0)

## 2018-09-06 LAB — METHYLMALONIC ACID, SERUM: Methylmalonic Acid, Quant: 134 nmol/L (ref 87–318)

## 2018-09-25 ENCOUNTER — Other Ambulatory Visit: Payer: Self-pay | Admitting: Physician Assistant

## 2018-09-25 DIAGNOSIS — Z1231 Encounter for screening mammogram for malignant neoplasm of breast: Secondary | ICD-10-CM

## 2018-10-18 ENCOUNTER — Other Ambulatory Visit: Payer: Self-pay | Admitting: Physician Assistant

## 2018-10-18 ENCOUNTER — Other Ambulatory Visit: Payer: Self-pay | Admitting: Adult Health

## 2018-10-18 DIAGNOSIS — F411 Generalized anxiety disorder: Secondary | ICD-10-CM

## 2018-10-18 DIAGNOSIS — G4700 Insomnia, unspecified: Secondary | ICD-10-CM

## 2018-11-04 ENCOUNTER — Ambulatory Visit: Payer: Self-pay | Admitting: Physician Assistant

## 2018-11-05 ENCOUNTER — Ambulatory Visit
Admission: RE | Admit: 2018-11-05 | Discharge: 2018-11-05 | Disposition: A | Discharge status: Home / Self Care | Payer: BLUE CROSS/BLUE SHIELD | Source: Ambulatory Visit | Attending: Physician Assistant | Admitting: Physician Assistant

## 2018-11-05 DIAGNOSIS — Z1231 Encounter for screening mammogram for malignant neoplasm of breast: Secondary | ICD-10-CM

## 2018-12-18 ENCOUNTER — Other Ambulatory Visit: Payer: Self-pay | Admitting: Physician Assistant

## 2018-12-18 DIAGNOSIS — F411 Generalized anxiety disorder: Secondary | ICD-10-CM

## 2018-12-18 DIAGNOSIS — G4700 Insomnia, unspecified: Secondary | ICD-10-CM

## 2019-03-29 ENCOUNTER — Other Ambulatory Visit: Payer: Self-pay

## 2019-03-29 ENCOUNTER — Telehealth: Payer: Self-pay | Admitting: Physician Assistant

## 2019-03-29 DIAGNOSIS — F411 Generalized anxiety disorder: Secondary | ICD-10-CM

## 2019-03-29 DIAGNOSIS — G4700 Insomnia, unspecified: Secondary | ICD-10-CM

## 2019-03-29 MED ORDER — LOSARTAN POTASSIUM 100 MG PO TABS: 100.0000 mg | ORAL_TABLET | Freq: Every day | ORAL | 11 refills | Status: DC

## 2019-03-29 MED ORDER — ESCITALOPRAM OXALATE 20 MG PO TABS: ORAL_TABLET | ORAL | 1 refills | Status: DC

## 2019-03-29 MED ORDER — ESTRADIOL 0.5 MG PO TABS: ORAL_TABLET | ORAL | 1 refills | Status: DC

## 2019-03-29 MED ORDER — ALPRAZOLAM 1 MG PO TABS: ORAL_TABLET | ORAL | 1 refills | Status: DC

## 2019-03-29 NOTE — Telephone Encounter (Signed)
Recent Visits Date Type Provider Dept  09/03/18 Office Visit Vicie Mutters, PA-C [facility]  10/09/17 Office Visit Vicie Mutters, PA-C [facility] recent visits within past 540 days with a meds authorizing provider and meeting all other requirements   Future Appointments No visits were found meeting these conditions.  Showing future appointments within next 150 days with a meds authorizing provider and meeting all other requirements

## 2019-03-29 NOTE — Telephone Encounter (Signed)
-----   Message from Elenor Quinones, [place] sent at 03/29/2019  9:57 AM EDT ----- Regarding: med refill Contact: [phone] Per pt/Yellow note: Patient would like 90 days supply for all medication.   Refill on XANAX Please & Thank You!   FYI: pharmacy:  [facility]/[address]

## 2019-04-28 ENCOUNTER — Encounter (INDEPENDENT_AMBULATORY_CARE_PROVIDER_SITE_OTHER): Payer: Self-pay

## 2019-05-17 ENCOUNTER — Other Ambulatory Visit: Payer: Self-pay

## 2019-05-17 DIAGNOSIS — Z20822 Contact with and (suspected) exposure to covid-19: Secondary | ICD-10-CM

## 2019-05-19 LAB — NOVEL CORONAVIRUS, NAA: SARS-CoV-2, NAA: NOT DETECTED

## 2019-06-05 MED ORDER — PHENTERMINE HCL 37.5 MG PO TABS: 37.5000 mg | ORAL_TABLET | Freq: Every day | ORAL | 0 refills | Status: DC

## 2019-08-17 ENCOUNTER — Other Ambulatory Visit: Payer: Self-pay | Admitting: Physician Assistant

## 2019-08-17 DIAGNOSIS — G4700 Insomnia, unspecified: Secondary | ICD-10-CM

## 2019-09-14 ENCOUNTER — Encounter: Payer: Self-pay | Admitting: Physician Assistant

## 2019-10-28 ENCOUNTER — Encounter: Payer: Self-pay | Admitting: Physician Assistant

## 2019-12-20 NOTE — Progress Notes (Signed)
Complete Physical  Assessment and Plan:  Essential hypertension - continue medications, DASH diet, exercise and monitor at home. Call if greater than 130/80.  - CBC with Differential/Platelet - BASIC METABOLIC PANEL WITH GFR - Hepatic function panel - TSH - Urinalysis, Routine w reflex microscopic - Microalbumin / creatinine urine ratio - EKG 12-Lead   LBBB (left bundle branch block) WNL - EKG 12-Lead  Osteopenia, unspecified location Osteopenia- get dexa next year, continue Vit D and Ca, weight bearing exercises  Anxiety state continue medications, stress management techniques discussed, increase water, good sleep hygiene discussed, increase exercise, and increase veggies.  Will try trintellix, 1.5 month of samples given  Malignant melanoma of torso excluding breast ([place]) Continue follow up oncology   Mixed hyperlipidemia -continue medications, check lipids, decrease fatty foods, increase activity.  - Lipid panel   Encounter for general adult medical examination with abnormal findings 1 year  Medication management - CBC with Differential/Platelet - BASIC METABOLIC PANEL WITH GFR - Hepatic function panel  Vitamin D deficiency  Fatigue Has long history of fatigue, discussed possible sleep study will put in referral but she is hesistant due to big deductible Check labs Will cut back on lexapro and start on adderall low dose  Lack of concentration -     amphetamine-dextroamphetamine (ADDERALL) 10 MG tablet; Take 1 tablet (10 mg total) by mouth 2 (two) times daily with a meal. -     Ambulatory referral to Neurology- suggest referral for sleep study  Raynaud's disease without gangrene Unknown if she had autoimmune work up- will get notes to try to avoid large work up Keep feet warm  Screening for diabetes mellitus -     Hemoglobin A1c  Screening, anemia, deficiency, iron -     Iron,Total/Total Iron Binding Cap -     Vitamin B12   Discussed med's effects and  SE's. Screening labs and tests as requested with regular follow-up as recommended. Over 40 minutes of exam, counseling, chart review, and complex, high level critical decision making was performed this visit.   HPI  64 y.o. female  presents for a complete physical and follow up for has Essential hypertension; Anxiety state; Melanoma ([place]); Mixed hyperlipidemia; Osteopenia; and LBBB (left bundle branch block) on their problem list..  Her mom has dementia, is a stressor for her, will go to visit frequently and stressed with this. She is frustrated with her weight, feels she can not lose weight. She is doing a weight watchers like program, runs in place with wi program, can not walk due to shin splints. Tried phentermine but it did not help, HOWEVER, she states she felt better, concentrated better. Had history of ADD and would like to try it. She is on lexapro (could not do celexa, wellbutrin, prozac)  She has chronic fatigue, has been on several anxiety medications with out help, she is sleeping well but does not feel rested in the AM still and later will occ have waves of fatigue. She states she has taken her pulse and it has been low. We discussed a sleep study and she states she has a high deductible and does not want it.   She complains of ulcers on her toes and varicose veins.   Her blood pressure has been controlled at home, today their BP is BP: 128/84 She does not workout. She denies chest pain, shortness of breath, dizziness.  LBBB X 2003,  Normal stress test at that time.   She is not on cholesterol medication and  denies myalgias. Her cholesterol is at goal. The cholesterol last visit was:   Lab Results  Component Value Date   CHOL 215 (H) 09/03/2018   HDL 85 09/03/2018   LDLCALC 102 (H) 09/03/2018   TRIG 168 (H) 09/03/2018   CHOLHDL 2.5 09/03/2018   Last A1C in the office was:  Lab Results  Component Value Date   HGBA1C 5.5 01/04/2016   Last GFR: Lab Results  Component Value  Date   GFRNONAA 93 09/03/2018   Patient is on Vitamin D supplement.   Lab Results  Component Value Date   VD25OH 45 01/04/2016     BMI is Body mass index is 30.72 kg/m., she is working on diet and exercise. Wt Readings from Last 3 Encounters:  12/21/19 181 lb 12.8 oz (82.5 kg)  09/03/18 170 lb 12.8 oz (77.5 kg)  10/09/17 163 lb 6.4 oz (74.1 kg)    Current Medications:  Current Outpatient Medications on File Prior to Visit  Medication Sig Dispense Refill  . ALPRAZolam (XANAX) 1 MG tablet TAKE 1/2 TO 1 TABLET BY MOUTH EVERY NIGHT AT BEDTIME ONLY IF NEEDED AND TRY TO LIMIT TO MAX 5 NIGHTS PER WEEK 30 tablet 0  . cholecalciferol (VITAMIN D) 1000 UNITS tablet Take 5,000 Units by mouth daily.     Marland Kitchen escitalopram (LEXAPRO) 20 MG tablet TAKE 1 TABLET(20 MG) BY MOUTH DAILY 90 tablet 1  . estradiol (ESTRACE) 0.5 MG tablet TAKE 1 TABLET(0.5 MG) BY MOUTH DAILY 90 tablet 1  . losartan (COZAAR) 100 MG tablet Take 1 tablet (100 mg total) by mouth daily. 30 tablet 11  . Multiple Vitamin (MULTIVITAMIN) tablet Take 1 tablet by mouth daily.    . vitamin C (ASCORBIC ACID) 500 MG tablet Take 500 mg by mouth daily.     No current facility-administered medications on file prior to visit.   Allergies:  Allergies  Allergen Reactions  . Doxycycline Hives   Medical History:  She has Essential hypertension; Anxiety state; Melanoma ([place]); Mixed hyperlipidemia; Osteopenia; and LBBB (left bundle branch block) on their problem list. Health Maintenance:   Immunization History  Administered Date(s) Administered  . Influenza Inj Mdck Quad With Preservative 09/18/2017, 09/03/2018  . Influenza-Unspecified 08/05/2013, 09/18/2014, 11/17/2016  . PPD Test 09/30/2014  . Td 08/30/2005  . Zoster 12/18/2016    Tetanus: 2013 Pneumovax: Prevnar 13:  Flu vaccine: 2019 Zostavax: 2018  LMP: s/p hysterectomy PAP 2016 s/p hysterectomy Dr. Garwin Brothers MGM:10/2018  DEXA: 08/2016 Colonoscopy: 01/2014 due this  year EGD: N/A RAS NEG 2017 CXR 2014 Lumbar Xray 2014 CT head 2003 : Patient Care Team: Unk Pinto, MD as PCP - General (Internal Medicine) Servando Salina, MD as Consulting Physician (Obstetrics and Gynecology) Adrian Prows, MD as Consulting Physician (Cardiology) Lafayette Dragon, MD (Inactive) as Consulting Physician (Gastroenterology) Jari Pigg, MD as Consulting Physician (Dermatology) Jacelyn Pi, MD as Consulting Physician (Endocrinology)  Surgical History:  She has a past surgical history that includes Partial hysterectomy (30); Tonsillectomy; and Melanoma excision. Family History:  Herfamily history includes Breast cancer in her sister; Cancer in her father, maternal grandmother, and paternal grandfather; Colon cancer in her maternal aunt; Heart disease in her paternal grandmother; Hypertension in her mother; Prostate cancer in her father; Stroke in her paternal grandmother. Social History:  She reports that she has never smoked. She has never used smokeless tobacco. She reports current alcohol use of about 7.0 standard drinks of alcohol per week. She reports that she does not use drugs.  Review  of Systems: Review of Systems  Constitutional: Positive for malaise/fatigue. Negative for chills, diaphoresis, fever and weight loss.  HENT: Positive for hearing loss and nosebleeds. Negative for congestion, ear discharge, ear pain, sinus pain, sore throat and tinnitus.   Eyes: Negative.   Respiratory: Negative.  Negative for stridor.   Cardiovascular: Negative.   Gastrointestinal: Positive for abdominal pain (occ left upper quadrant, last seconds). Negative for blood in stool, constipation, diarrhea, heartburn, melena, nausea and vomiting.  Genitourinary: Negative.   Musculoskeletal: Positive for joint pain. Negative for back pain, falls, myalgias and neck pain.  Skin: Negative.   Neurological: Negative.  Negative for weakness.  Endo/Heme/Allergies: Negative.    Psychiatric/Behavioral: Negative.     Physical Exam: Estimated body mass index is 30.72 kg/m as calculated from the following:   Height as of this encounter: 5' 4.5" (1.638 m).   Weight as of this encounter: 181 lb 12.8 oz (82.5 kg). BP 128/84   Pulse 66   Temp (!) 97.5 F (36.4 C)   Ht 5' 4.5" (1.638 m)   Wt 181 lb 12.8 oz (82.5 kg)   SpO2 98%   BMI 30.72 kg/m  General Appearance: Well nourished, in no apparent distress.  Eyes: PERRLA, EOMs, conjunctiva no swelling or erythema, normal fundi and vessels.  Sinuses: No Frontal/maxillary tenderness  ENT/Mouth: Ext aud canals clear, normal light reflex with TMs without erythema + bilateral effusions without erythema, pus, bulging of TM. Good dentition. No erythema, swelling, or exudate on post pharynx. Tonsils not swollen or erythematous. Hearing decreased Neck: Supple, thyroid normal. No bruits  Respiratory: Respiratory effort normal, BS equal bilaterally without rales, rhonchi, wheezing or stridor.  Cardio: RRR without murmurs, rubs or gallops. Brisk peripheral pulses without edema.  Chest: symmetric, with normal excursions and percussion.  Breasts: defer Abdomen: Soft, nontender, no guarding, rebound, hernias, masses, or organomegaly.  Lymphatics: Non tender without lymphadenopathy.  Genitourinary: defer Musculoskeletal: Full ROM all peripheral extremities,5/5 strength, and normal gait.  Skin: bilateral toes with some dryness, purplish color and no visible ulcers. Good cap refill.  Warm, dry without rashes, lesions, ecchymosis. Neuro: Cranial nerves intact, reflexes equal bilaterally. Normal muscle tone, no cerebellar symptoms. Sensation decreased Psych: Awake and oriented X 3, normal affect, Insight and Judgment appropriate.   EKG: WNL, LBBB, no ST changes. AORTA SCAN: defer  Vicie Mutters 11:35 AM [facility]

## 2019-12-21 ENCOUNTER — Other Ambulatory Visit: Payer: Self-pay

## 2019-12-21 ENCOUNTER — Encounter: Payer: Self-pay | Admitting: Physician Assistant

## 2019-12-21 ENCOUNTER — Ambulatory Visit (INDEPENDENT_AMBULATORY_CARE_PROVIDER_SITE_OTHER): Payer: Managed Care, Other (non HMO) | Admitting: Physician Assistant

## 2019-12-21 VITALS — BP 128/84 | HR 66 | Temp 97.5°F | Ht 64.5 in | Wt 181.8 lb

## 2019-12-21 DIAGNOSIS — Z13220 Encounter for screening for lipoid disorders: Secondary | ICD-10-CM | POA: Diagnosis not present

## 2019-12-21 DIAGNOSIS — E559 Vitamin D deficiency, unspecified: Secondary | ICD-10-CM

## 2019-12-21 DIAGNOSIS — Z79899 Other long term (current) drug therapy: Secondary | ICD-10-CM | POA: Diagnosis not present

## 2019-12-21 DIAGNOSIS — E782 Mixed hyperlipidemia: Secondary | ICD-10-CM

## 2019-12-21 DIAGNOSIS — F411 Generalized anxiety disorder: Secondary | ICD-10-CM

## 2019-12-21 DIAGNOSIS — Z1389 Encounter for screening for other disorder: Secondary | ICD-10-CM | POA: Diagnosis not present

## 2019-12-21 DIAGNOSIS — Z131 Encounter for screening for diabetes mellitus: Secondary | ICD-10-CM | POA: Diagnosis not present

## 2019-12-21 DIAGNOSIS — M8580 Other specified disorders of bone density and structure, unspecified site: Secondary | ICD-10-CM

## 2019-12-21 DIAGNOSIS — I10 Essential (primary) hypertension: Secondary | ICD-10-CM

## 2019-12-21 DIAGNOSIS — I7300 Raynaud's syndrome without gangrene: Secondary | ICD-10-CM

## 2019-12-21 DIAGNOSIS — R41840 Attention and concentration deficit: Secondary | ICD-10-CM

## 2019-12-21 DIAGNOSIS — R5383 Other fatigue: Secondary | ICD-10-CM

## 2019-12-21 DIAGNOSIS — Z0001 Encounter for general adult medical examination with abnormal findings: Secondary | ICD-10-CM

## 2019-12-21 DIAGNOSIS — Z130 Encounter for screening for diseases of the blood and blood-forming organs and certain disorders involving the immune mechanism: Secondary | ICD-10-CM

## 2019-12-21 DIAGNOSIS — Z0000 Encounter for general adult medical examination without abnormal findings: Secondary | ICD-10-CM | POA: Diagnosis not present

## 2019-12-21 DIAGNOSIS — C4359 Malignant melanoma of other part of trunk: Secondary | ICD-10-CM

## 2019-12-21 DIAGNOSIS — I447 Left bundle-branch block, unspecified: Secondary | ICD-10-CM

## 2019-12-21 MED ORDER — AMPHETAMINE-DEXTROAMPHETAMINE 10 MG PO TABS: 10.0000 mg | ORAL_TABLET | Freq: Two times a day (BID) | ORAL | 0 refills | Status: DC

## 2019-12-21 NOTE — Patient Instructions (Addendum)
Cut lexapro back to 1/2 pill a day Add on adderall 10 mg twice a day  I do want a sleep study on you  Please check on labs or note from rheum I will give you info about raynauds but you may benefit from a autoimmune work up   General eating tips  What to Avoid . Avoid added sugars o Often added sugar can be found in processed foods such as many condiments, dry cereals, cakes, cookies, chips, crisps, crackers, candies, sweetened drinks, etc.  o Read labels and AVOID/DECREASE use of foods with the following in their ingredient list: Sugar, fructose, high fructose corn syrup, sucrose, glucose, maltose, dextrose, molasses, cane sugar, brown sugar, any type of syrup, agave nectar, etc.   . Avoid snacking in between meals- drink water or if you feel you need a snack, pick a high water content snack such as cucumbers, watermelon, or any veggie.  Marland Kitchen Avoid foods made with flour o If you are going to eat food made with flour, choose those made with whole-grains; and, minimize your consumption as much as is tolerable . Avoid processed foods o These foods are generally stocked in the middle of the grocery store.  o Focus on shopping on the perimeter of the grocery.  What to Include . Vegetables o GREEN LEAFY VEGETABLES: Kale, spinach, mustard greens, collard greens, cabbage, broccoli, etc. o OTHER: Asparagus, cauliflower, eggplant, carrots, peas, Brussel sprouts, tomatoes, bell peppers, zucchini, beets, cucumbers, etc. . Grains, seeds, and legumes o Beans: kidney beans, black eyed peas, garbanzo beans, black beans, pinto beans, etc. o Whole, unrefined grains: brown rice, barley, bulgur, oatmeal, etc. . Healthy fats  o Avoid highly processed fats such as vegetable oil o Examples of healthy fats: avocado, olives, virgin olive oil, dark chocolate (?72% Cocoa), nuts (peanuts, almonds, walnuts, cashews, pecans, etc.) o Please still do small amount of these healthy fats, they are dense in calories.   . Low - Moderate Intake of Animal Sources of Protein o Meat sources: chicken, [place], salmon, tuna. Limit to 4 ounces of meat at one time or the size of your palm. o Consider limiting dairy sources, but when choosing dairy focus on: PLAIN [place] yogurt, cottage cheese, high-protein milk . Fruit o Choose berries   Raynaud Phenomenon  Raynaud phenomenon is a condition that affects the blood vessels (arteries) that carry blood to your fingers and toes. The arteries that supply blood to your ears, lips, nipples, or the tip of your nose might also be affected. Raynaud phenomenon causes the arteries to become narrow temporarily (spasm). As a result, the flow of blood to the affected areas is temporarily decreased. This usually occurs in response to cold temperatures or stress. During an attack, the skin in the affected areas turns white, then blue, and finally red. You may also feel tingling or numbness in those areas. Attacks usually last for only a brief period, and then the blood flow to the area returns to normal. In most cases, Raynaud phenomenon does not cause serious health problems. What are the causes? In many cases, the cause of this condition is not known. The condition may occur on its own (primary Raynaud phenomenon) or may be associated with other diseases or factors (secondary Raynaud phenomenon). Possible causes may include:  Diseases or medical conditions that damage the arteries.  Injuries and repetitive actions that hurt the hands or feet.  Being exposed to certain chemicals.  Taking medicines that narrow the arteries.  Other medical conditions, such  as lupus, scleroderma, rheumatoid arthritis, thyroid problems, blood disorders, Sjogren syndrome, or atherosclerosis. What increases the risk? The following factors may make you more likely to develop this condition:  Being 38-21 years old.  Being female.  Having a family history of Raynaud phenomenon.  Living in a cold  climate.  Smoking. What are the signs or symptoms? Symptoms of this condition usually occur when you are exposed to cold temperatures or when you have emotional stress. The symptoms may last for a few minutes or up to several hours. They usually affect your fingers but may also affect your toes, nipples, lips, ears, or the tip of your nose. Symptoms may include:  Changes in skin color. The skin in the affected areas will turn pale or white. The skin may then change from white to bluish to red as normal blood flow returns to the area.  Numbness, tingling, or pain in the affected areas. In severe cases, symptoms may include:  Skin sores.  Tissues decaying and dying (gangrene). How is this diagnosed? This condition may be diagnosed based on:  Your symptoms and medical history.  A physical exam. During the exam, you may be asked to put your hands in cold water to check for a reaction to cold temperature.  Tests, such as: ? Blood tests to check for other diseases or conditions. ? A test to check the movement of blood through your arteries and veins (vascular ultrasound). ? A test in which the skin at the base of your fingernail is examined under a microscope (nailfold capillaroscopy). How is this treated? Treatment for this condition often involves making lifestyle changes and taking steps to control your exposure to cold temperatures. For more severe cases, medicine (calcium channel blockers) may be used to improve blood flow. Surgery is sometimes done to block the nerves that control the affected arteries, but this is rare. Follow these instructions at home: Avoiding cold temperatures Take these steps to avoid exposure to cold:  If possible, stay indoors during cold weather.  When you go outside during cold weather, dress in layers and wear mittens, a hat, a scarf, and warm footwear.  Wear mittens or gloves when handling ice or frozen food.  Use holders for glasses or cans  containing cold drinks.  Let warm water run for a while before taking a shower or bath.  Warm up the car before driving in cold weather. Lifestyle   If possible, avoid stressful and emotional situations. Try to find ways to manage your stress, such as: ? Exercise. ? Yoga. ? Meditation. ? Biofeedback.  Do not use any products that contain nicotine or tobacco, such as cigarettes and e-cigarettes. If you need help quitting, ask your health care provider.  Avoid secondhand smoke.  Limit your use of caffeine. ? Switch to decaffeinated coffee, tea, and soda. ? Avoid chocolate.  Avoid vibrating tools and machinery. General instructions  Protect your hands and feet from injuries, cuts, or bruises.  Avoid wearing tight rings or wristbands.  Wear loose fitting socks and comfortable, roomy shoes.  Take over-the-counter and prescription medicines only as told by your health care provider. Contact a health care provider if:  Your discomfort becomes worse despite lifestyle changes.  You develop sores on your fingers or toes that do not heal.  Your fingers or toes turn black.  You have breaks in the skin on your fingers or toes.  You have a fever.  You have pain or swelling in your joints.  You have  a rash.  Your symptoms occur on only one side of your body. Summary  Raynaud phenomenon is a condition that affects the arteries that carry blood to your fingers, toes, ears, lips, nipples, or the tip of your nose.  In many cases, the cause of this condition is not known.  Symptoms of this condition include changes in skin color, and numbness and tingling of the affected area.  Treatment for this condition includes lifestyle changes, reducing exposure to cold temperatures, and using medicines for severe cases of the condition.  Contact your health care provider if your condition worsens despite treatment. This information is not intended to replace advice given to you by your  health care provider. Make sure you discuss any questions you have with your health care provider. Document Revised: 10/10/2017 Document Reviewed: 11/18/2016 Elsevier Patient Education  2020 [employer].

## 2019-12-22 DIAGNOSIS — F988 Other specified behavioral and emotional disorders with onset usually occurring in childhood and adolescence: Secondary | ICD-10-CM | POA: Insufficient documentation

## 2019-12-22 LAB — URINALYSIS, ROUTINE W REFLEX MICROSCOPIC
Bilirubin Urine: NEGATIVE
Glucose, UA: NEGATIVE
Hgb urine dipstick: NEGATIVE
Ketones, ur: NEGATIVE
Leukocytes,Ua: NEGATIVE
Nitrite: NEGATIVE
Protein, ur: NEGATIVE
Specific Gravity, Urine: 1.005 (ref 1.001–1.03)
pH: 6 (ref 5.0–8.0)

## 2019-12-22 LAB — LIPID PANEL
Cholesterol: 199 mg/dL (ref <200)
HDL: 85 mg/dL (ref >=50)
LDL Cholesterol (Calc): 98 mg/dL (calc)
Non-HDL Cholesterol (Calc): 114 mg/dL (calc) (ref <130)
Total CHOL/HDL Ratio: 2.3 (calc) (ref <5.0)
Triglycerides: 75 mg/dL (ref <150)

## 2019-12-22 LAB — CBC WITH DIFFERENTIAL/PLATELET
Absolute Monocytes: 606 cells/uL (ref 200–950)
Basophils Absolute: 37 cells/uL (ref 0–200)
Basophils Relative: 0.5 %
Eosinophils Absolute: 117 cells/uL (ref 15–500)
Eosinophils Relative: 1.6 %
HCT: 46.4 % — ABNORMAL HIGH (ref 35.0–45.0)
Hemoglobin: 15.7 g/dL — ABNORMAL HIGH (ref 11.7–15.5)
Lymphs Abs: 1613 cells/uL (ref 850–3900)
MCH: 31 pg (ref 27.0–33.0)
MCHC: 33.8 g/dL (ref 32.0–36.0)
MCV: 91.5 fL (ref 80.0–100.0)
MPV: 9.8 fL (ref 7.5–12.5)
Monocytes Relative: 8.3 %
Neutro Abs: 4928 cells/uL (ref 1500–7800)
Neutrophils Relative %: 67.5 %
Platelets: 332 10*3/uL (ref 140–400)
RBC: 5.07 10*6/uL (ref 3.80–5.10)
RDW: 11.8 % (ref 11.0–15.0)
Total Lymphocyte: 22.1 %
WBC: 7.3 10*3/uL (ref 3.8–10.8)

## 2019-12-22 LAB — VITAMIN B12: Vitamin B-12: 439 pg/mL (ref 200–1100)

## 2019-12-22 LAB — COMPLETE METABOLIC PANEL WITH GFR
AG Ratio: 2 (calc) (ref 1.0–2.5)
ALT: 39 U/L — ABNORMAL HIGH (ref 6–29)
AST: 23 U/L (ref 10–35)
Albumin: 4.4 g/dL (ref 3.6–5.1)
Alkaline phosphatase (APISO): 77 U/L (ref 37–153)
BUN: 13 mg/dL (ref 7–25)
CO2: 28 mmol/L (ref 20–32)
Calcium: 9.6 mg/dL (ref 8.6–10.4)
Chloride: 102 mmol/L (ref 98–110)
Creat: 0.7 mg/dL (ref 0.50–0.99)
GFR, Est African American: 107 mL/min/{1.73_m2} (ref >=60)
GFR, Est Non African American: 92 mL/min/{1.73_m2} (ref >=60)
Globulin: 2.2 g/dL (calc) (ref 1.9–3.7)
Glucose, Bld: 90 mg/dL (ref 65–99)
Potassium: 4.2 mmol/L (ref 3.5–5.3)
Sodium: 138 mmol/L (ref 135–146)
Total Bilirubin: 0.6 mg/dL (ref 0.2–1.2)
Total Protein: 6.6 g/dL (ref 6.1–8.1)

## 2019-12-22 LAB — MICROALBUMIN / CREATININE URINE RATIO
Creatinine, Urine: 24 mg/dL (ref 20–275)
Microalb Creat Ratio: 8 mcg/mg creat (ref <30)
Microalb, Ur: 0.2 mg/dL

## 2019-12-22 LAB — VITAMIN D 25 HYDROXY (VIT D DEFICIENCY, FRACTURES): Vit D, 25-Hydroxy: 32 ng/mL (ref 30–100)

## 2019-12-22 LAB — IRON, TOTAL/TOTAL IRON BINDING CAP
%SAT: 39 % (calc) (ref 16–45)
Iron: 140 ug/dL (ref 45–160)
TIBC: 356 mcg/dL (calc) (ref 250–450)

## 2019-12-22 LAB — TSH: TSH: 1.05 mIU/L (ref 0.40–4.50)

## 2019-12-22 LAB — MAGNESIUM: Magnesium: 2.3 mg/dL (ref 1.5–2.5)

## 2019-12-22 LAB — HEMOGLOBIN A1C
Hgb A1c MFr Bld: 5.3 % of total Hgb (ref <5.7)
Mean Plasma Glucose: 105 (calc)
eAG (mmol/L): 5.8 (calc)

## 2020-02-03 ENCOUNTER — Other Ambulatory Visit: Payer: Self-pay | Admitting: Physician Assistant

## 2020-02-03 DIAGNOSIS — F411 Generalized anxiety disorder: Secondary | ICD-10-CM

## 2020-03-14 ENCOUNTER — Other Ambulatory Visit: Payer: Self-pay | Admitting: Internal Medicine

## 2020-03-14 DIAGNOSIS — Z1231 Encounter for screening mammogram for malignant neoplasm of breast: Secondary | ICD-10-CM

## 2020-03-16 ENCOUNTER — Ambulatory Visit: Payer: Self-pay

## 2020-03-21 ENCOUNTER — Other Ambulatory Visit: Payer: Self-pay

## 2020-03-21 ENCOUNTER — Encounter: Payer: Self-pay | Admitting: Gastroenterology

## 2020-03-21 ENCOUNTER — Ambulatory Visit
Admission: RE | Admit: 2020-03-21 | Discharge: 2020-03-21 | Disposition: A | Discharge status: Home / Self Care | Payer: Managed Care, Other (non HMO) | Source: Ambulatory Visit | Attending: Internal Medicine | Admitting: Internal Medicine

## 2020-03-21 DIAGNOSIS — Z1231 Encounter for screening mammogram for malignant neoplasm of breast: Secondary | ICD-10-CM

## 2020-03-22 ENCOUNTER — Encounter: Payer: Self-pay | Admitting: Gastroenterology

## 2020-04-26 ENCOUNTER — Other Ambulatory Visit: Payer: Self-pay | Admitting: Physician Assistant

## 2020-04-27 ENCOUNTER — Ambulatory Visit (AMBULATORY_SURGERY_CENTER): Payer: Self-pay | Admitting: *Deleted

## 2020-04-27 ENCOUNTER — Other Ambulatory Visit: Payer: Self-pay

## 2020-04-27 VITALS — Ht 64.5 in | Wt 183.0 lb

## 2020-04-27 DIAGNOSIS — Z800 Family history of malignant neoplasm of digestive organs: Secondary | ICD-10-CM

## 2020-04-27 DIAGNOSIS — Z86010 Personal history of colonic polyps: Secondary | ICD-10-CM

## 2020-04-27 MED ORDER — SUTAB 1479-225-188 MG PO TABS: 1.0000 | ORAL_TABLET | Freq: Once | ORAL | 0 refills | Status: AC

## 2020-04-27 NOTE — Progress Notes (Signed)
J and J vaccine 12-30-19  Pt is aware that care partner will wait in the car during procedure; if they feel like they will be too hot or cold to wait in the car; they may wait in the 4 th floor lobby. Patient is aware to bring only one care partner. We want them to wear a mask (we do not have any that we can provide them), practice social distancing, and we will check their temperatures when they get here.  I did remind the patient that their care partner needs to stay in the parking lot the entire time and have a cell phone available, we will call them when the pt is ready for discharge. Patient will wear mask into building.   No trouble with anesthesia, difficulty with intubation or hx/fam hx of malignant hyperthermia per pt   No egg or soy allergy  No home oxygen use   No medications for weight loss taken  emmi information given  Pt denies constipation issues   Sutab code put into RX and paper copy given to pt to show pharmacy  Pt states she has issues since her last colonoscopy- "urgency and leakage."  She would like to speak with Dr. Tarri Glenn before the procedure and I assured her she woul

## 2020-05-01 ENCOUNTER — Encounter: Payer: Self-pay | Admitting: Gastroenterology

## 2020-05-11 ENCOUNTER — Encounter: Payer: Managed Care, Other (non HMO) | Admitting: Gastroenterology

## 2020-05-22 ENCOUNTER — Telehealth: Payer: Self-pay | Admitting: Gastroenterology

## 2020-05-22 NOTE — Telephone Encounter (Signed)
Pt is requesting a call back regarding her scheduled procedure.

## 2020-05-23 NOTE — Telephone Encounter (Signed)
Pt calling wanting to know if she needs to keep the previsit appt as she had one in July. Explained to pt that she does need to keep the appt as she needs to sign updated consent forms. Pt aware.

## 2020-07-27 ENCOUNTER — Other Ambulatory Visit: Payer: Self-pay

## 2020-07-27 ENCOUNTER — Ambulatory Visit (AMBULATORY_SURGERY_CENTER): Payer: Self-pay | Admitting: *Deleted

## 2020-07-27 VITALS — Ht 64.5 in | Wt 184.0 lb

## 2020-07-27 DIAGNOSIS — Z800 Family history of malignant neoplasm of digestive organs: Secondary | ICD-10-CM

## 2020-07-27 DIAGNOSIS — Z86010 Personal history of colonic polyps: Secondary | ICD-10-CM

## 2020-07-27 NOTE — Progress Notes (Signed)
j and j cov vax   No egg or soy allergy known to patient  No issues with past sedation with any surgeries or procedures no intubation problems in the past  No FH of Malignant Hyperthermia No diet pills per patient No home 02 use per patient  No blood thinners per patient  Pt denies issues with constipation  No A fib or A flutter  EMMI video to pt or via [facility] 19 guidelines implemented in PV today with Pt and RN   Pt requested Miralax prep- did Danis Miralax- instructed her to drink increased amounts of fluids Wednesday  and Thursday morning   Due to the COVID-19 pandemic we are asking patients to follow these guidelines. Please only bring one care partner. Please be aware that your care partner may wait in the car in the parking lot or if they feel like they will be too hot to wait in the car, they may wait in the lobby on the 4th floor. All care partners are required to wear a mask the entire time (we do not have any that we can provide them), they need to practice social distancing, and we will do a Covid check for all patient's and care partners when you arrive. Also we will check their temperature and your temperature. If the care partner waits in their car they need to stay in the parking lot the entire time and we will call them on their cell phone when the patient is ready for discharge so they can bring the car to the front of the building. Also all patient's will need to wear a mask into building.

## 2020-07-28 ENCOUNTER — Encounter: Payer: Self-pay | Admitting: Gastroenterology

## 2020-08-10 ENCOUNTER — Encounter: Payer: Self-pay | Admitting: Gastroenterology

## 2020-08-10 ENCOUNTER — Other Ambulatory Visit: Payer: Self-pay

## 2020-08-10 ENCOUNTER — Ambulatory Visit (AMBULATORY_SURGERY_CENTER): Payer: Managed Care, Other (non HMO) | Admitting: Gastroenterology

## 2020-08-10 VITALS — BP 169/87 | HR 63 | Temp 97.9°F | Resp 13 | Ht 64.0 in | Wt 184.0 lb

## 2020-08-10 DIAGNOSIS — D122 Benign neoplasm of ascending colon: Secondary | ICD-10-CM

## 2020-08-10 DIAGNOSIS — D124 Benign neoplasm of descending colon: Secondary | ICD-10-CM

## 2020-08-10 DIAGNOSIS — Z86010 Personal history of colonic polyps: Secondary | ICD-10-CM

## 2020-08-10 DIAGNOSIS — Z1211 Encounter for screening for malignant neoplasm of colon: Secondary | ICD-10-CM | POA: Diagnosis present

## 2020-08-10 DIAGNOSIS — D123 Benign neoplasm of transverse colon: Secondary | ICD-10-CM

## 2020-08-10 DIAGNOSIS — D125 Benign neoplasm of sigmoid colon: Secondary | ICD-10-CM | POA: Diagnosis not present

## 2020-08-10 MED ORDER — SODIUM CHLORIDE 0.9 % IV SOLN
500.0000 mL | Freq: Once | INTRAVENOUS | Status: DC
Start: 2020-08-10 — End: 2020-08-10

## 2020-08-10 NOTE — Patient Instructions (Signed)
HANDOUTS PROVIDED ON: POLYPS  The polyps removed today have been sent for pathology.  The results can take 1-3 weeks to receive.  When your next colonoscopy should occur will be based on the pathology results.    You may resume your previous diet and medication schedule.  Thank you for allowing [place] to care for you today!!!   YOU HAD AN ENDOSCOPIC PROCEDURE TODAY AT [facility]:   Refer to the procedure report that was given to you for any specific questions about what was found during the examination.  If the procedure report does not answer your questions, please call your gastroenterologist to clarify.  If you requested that your care partner not be given the details of your procedure findings, then the procedure report has been included in a sealed envelope for you to review at your convenience later.  YOU SHOULD EXPECT: Some feelings of bloating in the abdomen. Passage of more gas than usual.  Walking can help get rid of the air that was put into your GI tract during the procedure and reduce the bloating. If you had a lower endoscopy (such as a colonoscopy or flexible sigmoidoscopy) you may notice spotting of blood in your stool or on the toilet paper. If you underwent a bowel prep for your procedure, you may not have a normal bowel movement for a few days.  Please Note:  You might notice some irritation and congestion in your nose or some drainage.  This is from the oxygen used during your procedure.  There is no need for concern and it should clear up in a day or so.  SYMPTOMS TO REPORT IMMEDIATELY:   Following lower endoscopy (colonoscopy or flexible sigmoidoscopy):  Excessive amounts of blood in the stool  Significant tenderness or worsening of abdominal pains  Swelling of the abdomen that is new, acute  Fever of 100F or higher  For urgent or emergent issues, a gastroenterologist can be reached at any hour by calling [phone]. Do not use MyChart messaging for  urgent concerns.    DIET:  We do recommend a small meal at first, but then you may proceed to your regular diet.  Drink plenty of fluids but you should avoid alcoholic beverages for 24 hours.  ACTIVITY:  You should plan to take it easy for the rest of today and you should NOT DRIVE or use heavy machinery until tomorrow (because of the sedation medicines used during the test).    FOLLOW UP: Our staff will call the number listed on your records 48-72 hours following your procedure to check on you and address any questions or concerns that you may have regarding the information given to you following your procedure. If we do not reach you, we will leave a message.  We will attempt to reach you two times.  During this call, we will ask if you have developed any symptoms of COVID 19. If you develop any symptoms (ie: fever, flu-like symptoms, shortness of breath, cough etc.) before then, please call [phone].  If you test positive for Covid 19 in the 2 weeks post procedure, please call and report this information to [place].    If any biopsies were taken you will be contacted by phone or by letter within the next 1-3 weeks.  Please call us at [phone] if you have not heard about the biopsies in 3 weeks.    SIGNATURES/CONFIDENTIALITY: You and/or your care partner have signed paperwork which will be entered into  your electronic medical record.  These signatures attest to the fact that that the information above on your After Visit Summary has been reviewed and is understood.  Full responsibility of the confidentiality of this discharge information lies with you and/or your care-partner.

## 2020-08-10 NOTE — Progress Notes (Signed)
Pt Drowsy. VSS. To PACU, report to RN. No anesthetic complications noted. bovie site clear 

## 2020-08-10 NOTE — Progress Notes (Signed)
Called to room to assist during endoscopic procedure.  Patient ID and intended procedure confirmed with present staff. Received instructions for my participation in the procedure from the performing physician.  

## 2020-08-10 NOTE — Op Note (Signed)
[facility] Patient Name: Maureen Henderson Procedure Date: 08/10/2020 3:05 PM MRN: [id number] Endoscopist: Thornton Park MD, MD Age: 64 Referring MD:  Date of Birth: 1956/07/23 Gender: Female Account #: [id number] Procedure:                Colonoscopy Indications:              High risk colon cancer surveillance: Personal                            history of sessile serrated colon polyp (less than                            10 mm in size) with no dysplasia                           >51mm sessile serrated polyp removed on colonoscopy                            with Dr. Olevia Perches in 2015                           Multiple first degree family members with colon                            polyps Medicines:                Monitored Anesthesia Care Procedure:                Pre-Anesthesia Assessment:                           - Prior to the procedure, a History and Physical                            was performed, and patient medications and                            allergies were reviewed. The patient's tolerance of                            previous anesthesia was also reviewed. The risks                            and benefits of the procedure and the sedation                            options and risks were discussed with the patient.                            All questions were answered, and informed consent                            was obtained. Prior Anticoagulants: The patient has  taken no previous anticoagulant or antiplatelet                            agents. ASA Grade Assessment: II - A patient with                            mild systemic disease. After reviewing the risks                            and benefits, the patient was deemed in                            satisfactory condition to undergo the procedure.                           After obtaining informed consent, the colonoscope                            was passed under  direct vision. Throughout the                            procedure, the patient's blood pressure, pulse, and                            oxygen saturations were monitored continuously. The                            Colonoscope was introduced through the anus and                            advanced to the the cecum, identified by                            appendiceal orifice and ileocecal valve. The                            patient tolerated the procedure well. The quality                            of the bowel preparation was good. The terminal                            ileum, ileocecal valve, appendiceal orifice, and                            rectum were photographed. The colonoscopy was                            technically difficult and complex due to a                            redundant colon, significant looping and a tortuous  colon. Successful completion of the procedure was                            aided by changing the patient's position and                            applying abdominal pressure. Scope In: 3:18:30 PM Scope Out: 4:08:57 PM Scope Withdrawal Time: 0 hours 44 minutes 56 seconds  Total Procedure Duration: 0 hours 50 minutes 27 seconds  Findings:                 The perianal and digital rectal examinations were                            normal.                           Two flat polyps were found in the descending colon.                            The polyps were 10 to 15 mm in size. These polyps                            were removed with a hot snare. Resection and                            retrieval were complete. Estimated blood loss was                            minimal.                           Nine sessile polyps were found in the sigmoid colon                            (8mm, 23mm), descending colon (45mm), transverse                            colon (89mm) and ascending colon (64mm, 35mm). The                             polyps were 2 to 6 mm in size. These polyps were                            removed with a cold snare. Resection and retrieval                            were complete. Estimated blood loss was minimal.                           Significant colon spasm occurred during this                            prolonged procedure. There may be small polyps that  were missed, particularly in the left colon. The                            exam was otherwise without abnormality on direct                            and retroflexion views. Complications:            No immediate complications. Estimated blood loss:                            Minimal. Estimated Blood Loss:     Estimated blood loss was minimal. Impression:               - Two 10 to 15 mm polyps in the descending colon,                            removed with a hot snare. Resected and retrieved.                           - Nine 2 to 6 mm polyps in the sigmoid colon, in                            the descending colon, in the transverse colon and                            in the ascending colon, removed with a cold snare.                            Resected and retrieved.                           - The examination was otherwise normal on direct                            and retroflexion views. Recommendation:           - Patient has a contact number available for                            emergencies. The signs and symptoms of potential                            delayed complications were discussed with the                            patient. Return to normal activities tomorrow.                            Written discharge instructions were provided to the                            patient.                           -  Resume previous diet.                           - Continue present medications.                           - Await pathology results.                           - Repeat colonoscopy in 1 year for  surveillance.                            Use Robinol at the start of the next procedure to                            minimize colonic spasm.                           - Emerging evidence supports eating a diet of                            fruits, vegetables, grains, calcium, and yogurt                            while reducing red meat and alcohol may reduce the                            risk of colon cancer.                           - Thank you for allowing me to be involved in your                            colon cancer prevention. Thornton Park MD, MD 08/10/2020 4:33:43 PM This report has been signed electronically.

## 2020-08-14 ENCOUNTER — Telehealth: Payer: Self-pay

## 2020-08-14 NOTE — Telephone Encounter (Signed)
  Follow up Call-  Call back number 08/10/2020  Post procedure Call Back phone  # [phone]  Permission to leave phone message Yes  Some recent data might be hidden     Patient questions:  Do you have a fever, pain , or abdominal swelling? No. Pain Score  0 *  Have you tolerated food without any problems? No.  Have you been able to return to your normal activities? Yes.    Do you have any questions about your discharge instructions: Diet   No. Medications  No. Follow up visit  No.  Do you have questions or concerns about your Care? No.  Actions: * If pain score is 4 or above: No action needed, pain <4.  1. Have you developed a fever since your procedure? no  2.   Have you had an respiratory symptoms (SOB or cough) since your procedure? no  3.   Have you tested positive for COVID 19 since your procedure no  4.   Have you had any family members/close contacts diagnosed with the COVID 19 since your procedure?  no   If yes to any of these questions please route to Joylene John, RN and Joella Prince, RN

## 2020-08-16 ENCOUNTER — Other Ambulatory Visit: Payer: Self-pay | Admitting: Adult Health Nurse Practitioner

## 2020-08-16 ENCOUNTER — Other Ambulatory Visit: Payer: Self-pay

## 2020-08-16 DIAGNOSIS — F411 Generalized anxiety disorder: Secondary | ICD-10-CM

## 2020-08-16 DIAGNOSIS — G4700 Insomnia, unspecified: Secondary | ICD-10-CM

## 2020-08-16 MED ORDER — ESTRADIOL 0.5 MG PO TABS: ORAL_TABLET | ORAL | 1 refills | Status: DC

## 2020-08-16 MED ORDER — ALPRAZOLAM 1 MG PO TABS: ORAL_TABLET | ORAL | 0 refills | Status: DC

## 2020-08-21 ENCOUNTER — Encounter: Payer: Self-pay | Admitting: Gastroenterology

## 2020-09-13 ENCOUNTER — Encounter: Payer: BLUE CROSS/BLUE SHIELD | Admitting: Physician Assistant

## 2020-09-16 ENCOUNTER — Other Ambulatory Visit: Payer: Self-pay

## 2020-09-16 ENCOUNTER — Ambulatory Visit (HOSPITAL_COMMUNITY)
Admission: EM | Admit: 2020-09-16 | Discharge: 2020-09-16 | Disposition: A | Discharge status: Home / Self Care | Payer: Managed Care, Other (non HMO) | Attending: Family Medicine | Admitting: Family Medicine

## 2020-09-16 ENCOUNTER — Encounter (HOSPITAL_COMMUNITY): Payer: Self-pay

## 2020-09-16 DIAGNOSIS — H65191 Other acute nonsuppurative otitis media, right ear: Secondary | ICD-10-CM | POA: Diagnosis not present

## 2020-09-16 DIAGNOSIS — M5410 Radiculopathy, site unspecified: Secondary | ICD-10-CM

## 2020-09-16 DIAGNOSIS — R202 Paresthesia of skin: Secondary | ICD-10-CM

## 2020-09-16 DIAGNOSIS — H938X1 Other specified disorders of right ear: Secondary | ICD-10-CM | POA: Diagnosis not present

## 2020-09-16 DIAGNOSIS — R200 Anesthesia of skin: Secondary | ICD-10-CM | POA: Diagnosis not present

## 2020-09-16 MED ORDER — PREDNISONE 10 MG (21) PO TBPK: ORAL_TABLET | Freq: Every day | ORAL | 0 refills | Status: AC

## 2020-09-16 NOTE — Discharge Instructions (Addendum)
I have sent in a prednisone taper for you to take for 6 days. 6 tablets on day one, 5 tablets on day two, 4 tablets on day three, 3 tablets on day four, 2 tablets on day five, and 1 tablet on day six.  Follow up with this office or with primary care if symptoms are persisting.  Follow up in the ER for high fever, trouble swallowing, trouble breathing, other concerning symptoms.   

## 2020-09-16 NOTE — ED Triage Notes (Signed)
Pt presents with some loss of hearing in right ear; pt states she already has some sensory issues with her ears.

## 2020-09-16 NOTE — ED Provider Notes (Signed)
[facility]    CSN: [id number] Arrival date & time: 09/16/20  1145      History   Chief Complaint Chief Complaint  Patient presents with  . Ear Fullness    HPI Maureen Henderson is a 64 y.o. female.   Patient reports ear fullness that she is noted for the last 2 days.  She reports that she already has some sensorineural hearing loss.  She is concerned that this is worsening.  Has not attempted OTC treatment for this.  Denies headache, cough, shortness of breath, nausea, vomiting, diarrhea, rash, fever, other symptoms.  Also reports that her left fingers are tingling like they are falling asleep.  Reports that she was in the waiting area leaning on her left elbow and playing on her phone while she was waiting for her appointment.  Reports that this is only been happening since she has been in this office.  Has not attempted OTC treatment.  Denies loss of strength, loss of sensation, loss of awareness of the fingertips.  ROS per HPI  The history is provided by the patient.  Ear Fullness    Past Medical History:  Diagnosis Date  . Adenomatous endometrial hyperplasia    age 56 - hysterectomy   . Anxiety   . Arthritis   . Bundle, branch block, left   . Cataract    "beginnings of" per pt  . Depression   . Hyperlipidemia    no per pt- no meds taken  . Hypertension   . Melanoma ([place]) 2012   chest  . Osteopenia     Patient Active Problem List   Diagnosis Date Noted  . Attention deficit disorder (ADD) 12/22/2019  . Osteopenia 01/04/2016  . LBBB (left bundle branch block) 01/04/2016  . Mixed hyperlipidemia 06/15/2015  . Melanoma ([place])   . Essential hypertension 08/31/2013  . Anxiety state 08/31/2013    Past Surgical History:  Procedure Laterality Date  . COLONOSCOPY    . MELANOMA EXCISION     chest  . PARTIAL HYSTERECTOMY  30   uterine polyps  . POLYPECTOMY    . TONSILLECTOMY      OB History   No obstetric history on file.      Home  Medications    Prior to Admission medications   Medication Sig Start Date End Date Taking? Authorizing Provider  ALPRAZolam (XANAX) 1 MG tablet TAKE 1/2 TO 1 TABLET BY MOUTH EVERY NIGHT AT BEDTIME ONLY IF NEEDED AND TRY TO LIMIT TO MAX 5 NIGHTS PER WEEK 08/16/20   McClanahan, Danton Sewer, NP  amphetamine-dextroamphetamine (ADDERALL) 10 MG tablet Take 1 tablet (10 mg total) by mouth 2 (two) times daily with a meal. Patient not taking: Reported on 08/10/2020 12/21/19 12/20/20  Vladimir Crofts, PA-C  ASPIRIN 81 PO Take by mouth. 3 x a week Patient not taking: Reported on 08/10/2020    [provider]  cholecalciferol (VITAMIN D) 1000 UNITS tablet Take 2,000 Units by mouth daily. 2000 units daily    [provider]  escitalopram (LEXAPRO) 20 MG tablet TAKE 1 TABLET(20 MG) BY MOUTH DAILY Patient taking differently: 10 mg daily. TAKE 1 TABLET(20 MG) BY MOUTH DAILY 02/03/20   Vladimir Crofts, PA-C  estradiol (ESTRACE) 0.5 MG tablet TAKE 1 TABLET(0.5 MG) BY MOUTH DAILY 08/16/20   Garnet Sierras, NP  losartan (COZAAR) 100 MG tablet TAKE 1 TABLET(100 MG) BY MOUTH DAILY Patient taking differently: 50 mg.  04/26/20   Liane Comber, NP  Multiple Vitamin (  MULTIVITAMIN) tablet Take 1 tablet by mouth daily.    [provider]  predniSONE (STERAPRED UNI-PAK 21 TAB) 10 MG (21) TBPK tablet Take by mouth daily for 6 days. Take 6 tablets on day 1, 5 tablets on day 2, 4 tablets on day 3, 3 tablets on day 4, 2 tablets on day 5, 1 tablet on day 6 09/16/20 09/22/20  Faustino Congress, NP  vitamin C (ASCORBIC ACID) 500 MG tablet Take 500 mg by mouth daily.    [provider]    Family History Family History  Problem Relation Age of Onset  . Hypertension Mother   . Cancer Father   . Prostate cancer Father   . Breast cancer Sister   . Colon cancer Maternal Aunt   . Colon polyps Sister   . Cancer Maternal Grandmother   . Heart disease Paternal Grandmother   . Stroke Paternal  Grandmother   . Cancer Paternal Grandfather   . Esophageal cancer Neg Hx   . Stomach cancer Neg Hx   . Rectal cancer Neg Hx     Social History Social History   Tobacco Use  . Smoking status: Never Smoker  . Smokeless tobacco: Never Used  Vaping Use  . Vaping Use: Never used  Substance Use Topics  . Alcohol use: Yes    Alcohol/week: 7.0 standard drinks    Types: 7 Glasses of wine per week  . Drug use: No     Allergies   Doxycycline   Review of Systems Review of Systems   Physical Exam Triage Vital Signs ED Triage Vitals  Enc Vitals Group     BP 09/16/20 1248 (!) 161/90     Pulse Rate 09/16/20 1248 63     Resp 09/16/20 1248 17     Temp 09/16/20 1248 98.3 F (36.8 C)     Temp Source 09/16/20 1248 Oral     SpO2 09/16/20 1248 99 %     Weight --      Height --      Head Circumference --      Peak Flow --      Pain Score 09/16/20 1247 0     Pain Loc --      Pain Edu? --      Excl. in [place]? --    No data found.  Updated Vital Signs BP (!) 161/90 (BP Location: Right Arm)   Pulse 63   Temp 98.3 F (36.8 C) (Oral)   Resp 17   SpO2 99%     Physical Exam Vitals and nursing note reviewed.  Constitutional:      General: She is not in acute distress.    Appearance: Normal appearance. She is well-developed. She is not ill-appearing.  HENT:     Head: Normocephalic and atraumatic.     Right Ear: Ear canal and external ear normal. A middle ear effusion is present.     Left Ear: Tympanic membrane, ear canal and external ear normal.  Eyes:     Conjunctiva/sclera: Conjunctivae normal.  Cardiovascular:     Rate and Rhythm: Normal rate and regular rhythm.     Heart sounds: Normal heart sounds. No murmur heard.   Pulmonary:     Effort: Pulmonary effort is normal. No respiratory distress.     Breath sounds: Normal breath sounds. No stridor. No wheezing, rhonchi or rales.  Chest:     Chest wall: No tenderness.  Abdominal:     Palpations: Abdomen is soft.  Tenderness: There is no abdominal tenderness.  Musculoskeletal:        General: Normal range of motion.     Cervical back: Normal range of motion and neck supple.  Skin:    General: Skin is warm and dry.     Capillary Refill: Capillary refill takes less than 2 seconds.  Neurological:     General: No focal deficit present.     Mental Status: She is alert and oriented to person, place, and time.     Sensory: Sensation is intact.     Motor: Motor function is intact. No weakness, tremor, atrophy, abnormal muscle tone or pronator drift.     Coordination: Coordination is intact. Coordination normal. Finger-Nose-Finger Test normal.     Gait: Gait is intact.     Deep Tendon Reflexes: Reflexes are normal and symmetric.  Psychiatric:        Mood and Affect: Mood normal.        Behavior: Behavior normal.        Thought Content: Thought content normal.      UC Treatments / Results  Labs (all labs ordered are listed, but only abnormal results are displayed) Labs Reviewed - No data to display  EKG   Radiology No results found.  Procedures Procedures (including critical care time)  Medications Ordered in UC Medications - No data to display  Initial Impression / Assessment and Plan / UC Course  I have reviewed the triage vital signs and the nursing notes.  Pertinent labs & imaging results that were available during my care of the patient were reviewed by me and considered in my medical decision making (see chart for details).  Right ear fullness Right middle ear effusion Numbness and tingling in the left hand Radiculopathy affecting the upper extremity  Prescribed steroid taper This will help with inflammation in the elbow joint causing the numbness and tingling in the hand This will also help prevent further sensorineural hearing loss if your fullness is not coming from the effusion Ear canals clear Discussed signs and symptoms of stroke with numbness and tingling in the hand,  neuro exam intact  Discussed that if symptoms acutely worsen to follow-up in the ER Final Clinical Impressions(s) / UC Diagnoses   Final diagnoses:  Ear fullness, right  Radiculopathy affecting upper extremity  Acute MEE (middle ear effusion), right  Numbness and tingling in left hand     Discharge Instructions     I have sent in a prednisone taper for you to take for 6 days. 6 tablets on day one, 5 tablets on day two, 4 tablets on day three, 3 tablets on day four, 2 tablets on day five, and 1 tablet on day six.  Follow up with this office or with primary care if symptoms are persisting.  Follow up in the ER for high fever, trouble swallowing, trouble breathing, other concerning symptoms.     ED Prescriptions    Medication Sig Dispense Auth. Provider   predniSONE (STERAPRED UNI-PAK 21 TAB) 10 MG (21) TBPK tablet Take by mouth daily for 6 days. Take 6 tablets on day 1, 5 tablets on day 2, 4 tablets on day 3, 3 tablets on day 4, 2 tablets on day 5, 1 tablet on day 6 21 tablet Faustino Congress, NP     PDMP not reviewed this encounter.   Faustino Congress, NP 09/16/20 1553

## 2020-10-06 ENCOUNTER — Ambulatory Visit: Payer: Managed Care, Other (non HMO) | Attending: Internal Medicine

## 2020-10-06 ENCOUNTER — Ambulatory Visit: Payer: Self-pay

## 2020-10-06 DIAGNOSIS — Z23 Encounter for immunization: Secondary | ICD-10-CM

## 2020-10-06 NOTE — Progress Notes (Signed)
   Covid-19 Vaccination Clinic  Name:  Maureen Henderson    MRN: [id number] DOB: [birth year]  10/06/2020  Maureen Henderson was observed post Covid-19 immunization for 15 minutes without incident. She was provided with Vaccine Information Sheet and instruction to access the V-Safe system.   Maureen Henderson was instructed to call 911 with any severe reactions post vaccine: Marland Kitchen Difficulty breathing  . Swelling of face and throat  . A fast heartbeat  . A bad rash all over body  . Dizziness and weakness   Immunizations Administered    Name Date Dose VIS Date Route   Moderna Covid-19 Booster Vaccine 10/06/2020  3:32 PM 0.25 mL 08/09/2020 Intramuscular   Manufacturer: Moderna   Lot: [id number]   [place]: [id number]

## 2020-10-06 NOTE — Telephone Encounter (Signed)
Patient called and asks about the side effects of the J&J COVID Booster. She says she's hasn't heard much about it like the other vaccines and wonders what the side effects will be. I advised the side effects of all vaccine boosters are the same as with the initial vaccine. I advised if she had symptoms with the initial injection, then it's likely the same symptoms, because the dosage is the same. Patient verbalized understanding.  Summary: Clinical Advice   Patient would like to speak with nurse on potential side effects of j & j covid booster. Please advise       Reason for Disposition . General information question, no triage required and triager able to answer question  Answer Assessment - Initial Assessment Questions 1. REASON FOR CALL or QUESTION: "What is your reason for calling today?" or "How can I best help you?" or "What question do you have that I can help answer?"     Side effects of the J&J Booster  Protocols used: INFORMATION ONLY CALL - NO TRIAGE-A-AH

## 2020-10-17 ENCOUNTER — Other Ambulatory Visit: Payer: Self-pay

## 2020-10-17 DIAGNOSIS — F411 Generalized anxiety disorder: Secondary | ICD-10-CM

## 2020-10-17 DIAGNOSIS — G4700 Insomnia, unspecified: Secondary | ICD-10-CM

## 2020-10-17 MED ORDER — ALPRAZOLAM 1 MG PO TABS: ORAL_TABLET | ORAL | 0 refills | Status: DC

## 2020-10-19 ENCOUNTER — Other Ambulatory Visit: Payer: Self-pay | Admitting: Adult Health Nurse Practitioner

## 2020-10-19 DIAGNOSIS — I10 Essential (primary) hypertension: Secondary | ICD-10-CM

## 2020-10-19 DIAGNOSIS — F411 Generalized anxiety disorder: Secondary | ICD-10-CM

## 2020-10-19 MED ORDER — LOSARTAN POTASSIUM 100 MG PO TABS: ORAL_TABLET | ORAL | 2 refills | Status: DC

## 2020-10-19 MED ORDER — ESCITALOPRAM OXALATE 10 MG PO TABS: ORAL_TABLET | ORAL | 0 refills | Status: DC

## 2020-10-26 ENCOUNTER — Encounter: Payer: BLUE CROSS/BLUE SHIELD | Admitting: Physician Assistant

## 2020-10-30 ENCOUNTER — Encounter: Payer: Managed Care, Other (non HMO) | Admitting: Adult Health Nurse Practitioner

## 2020-12-12 ENCOUNTER — Encounter: Payer: Managed Care, Other (non HMO) | Admitting: Adult Health Nurse Practitioner

## 2021-02-21 ENCOUNTER — Other Ambulatory Visit: Payer: Self-pay | Admitting: Adult Health Nurse Practitioner

## 2021-02-21 DIAGNOSIS — F411 Generalized anxiety disorder: Secondary | ICD-10-CM

## 2021-02-22 ENCOUNTER — Encounter: Payer: Managed Care, Other (non HMO) | Admitting: Adult Health Nurse Practitioner

## 2021-04-02 ENCOUNTER — Other Ambulatory Visit: Payer: Self-pay | Admitting: Internal Medicine

## 2021-04-02 DIAGNOSIS — Z1231 Encounter for screening mammogram for malignant neoplasm of breast: Secondary | ICD-10-CM

## 2021-04-05 ENCOUNTER — Ambulatory Visit
Admission: RE | Admit: 2021-04-05 | Discharge: 2021-04-05 | Disposition: A | Discharge status: Home / Self Care | Payer: BC Managed Care – PPO | Source: Ambulatory Visit | Attending: Internal Medicine | Admitting: Internal Medicine

## 2021-04-05 ENCOUNTER — Other Ambulatory Visit: Payer: Self-pay

## 2021-04-05 DIAGNOSIS — Z1231 Encounter for screening mammogram for malignant neoplasm of breast: Secondary | ICD-10-CM

## 2021-04-24 NOTE — Progress Notes (Deleted)
Complete Physical  Assessment and Plan:  Essential hypertension - continue medications, DASH diet, exercise and monitor at home. Call if greater than 130/80.  - CBC with Differential/Platelet - BASIC METABOLIC PANEL WITH GFR - Hepatic function panel - TSH - Urinalysis, Routine w reflex microscopic - Microalbumin / creatinine urine ratio - EKG 12-Lead   LBBB (left bundle branch block) WNL - EKG 12-Lead  Osteopenia, unspecified location Osteopenia- get dexa next year, continue Vit D and Ca, weight bearing exercises  Anxiety state continue medications, stress management techniques discussed, increase water, good sleep hygiene discussed, increase exercise, and increase veggies.  Will try trintellix, 1.5 month of samples given  Malignant melanoma of torso excluding breast ([place]) Continue follow up oncology   Mixed hyperlipidemia -continue medications, check lipids, decrease fatty foods, increase activity.  - Lipid panel   Encounter for general adult medical examination with abnormal findings 1 year  Medication management - CBC with Differential/Platelet - BASIC METABOLIC PANEL WITH GFR - Hepatic function panel  Vitamin D deficiency  Fatigue Has long history of fatigue, discussed possible sleep study will put in referral but she is hesistant due to big deductible Check labs Will cut back on lexapro and start on adderall low dose  Lack of concentration -     amphetamine-dextroamphetamine (ADDERALL) 10 MG tablet; Take 1 tablet (10 mg total) by mouth 2 (two) times daily with a meal. -     Ambulatory referral to Neurology- suggest referral for sleep study  Raynaud's disease without gangrene Unknown if she had autoimmune work up- will get notes to try to avoid large work up Keep feet warm  Screening for diabetes mellitus -     Hemoglobin A1c  Screening, anemia, deficiency, iron -     Iron, Total/Total Iron Binding Cap -     Vitamin B12   Discussed med's effects and  SE's. Screening labs and tests as requested with regular follow-up as recommended. Over 40 minutes of exam, counseling, chart review, and complex, high level critical decision making was performed this visit.   HPI  65 y.o. female  presents for a complete physical and follow up for has Essential hypertension; Anxiety state; Melanoma ([place]); Mixed hyperlipidemia; Osteopenia; LBBB (left bundle branch block); and Attention deficit disorder (ADD) on their problem list..  Her mom has dementia, is a stressor for her, will go to visit frequently and stressed with this. She is frustrated with her weight, feels she can not lose weight. She is doing a weight watchers like program, runs in place with wi program, can not walk due to shin splints. Tried phentermine but it did not help, HOWEVER, she states she felt better, concentrated better. Had history of ADD and would like to try it. She is on lexapro (could not do celexa, wellbutrin, prozac)  She has chronic fatigue, has been on several anxiety medications with out help, she is sleeping well but does not feel rested in the AM still and later will occ have waves of fatigue. She states she has taken her pulse and it has been low. We discussed a sleep study and she states she has a high deductible and does not want it.   She complains of ulcers on her toes and varicose veins.   Her blood pressure has been controlled at home, today their BP is   She does not workout. She denies chest pain, shortness of breath, dizziness.  LBBB X 2003,  Normal stress test at that time.   She is  not on cholesterol medication and denies myalgias. Her cholesterol is at goal. The cholesterol last visit was:   Lab Results  Component Value Date   CHOL 199 12/21/2019   HDL 85 12/21/2019   LDLCALC 98 12/21/2019   TRIG 75 12/21/2019   CHOLHDL 2.3 12/21/2019   Last A1C in the office was:  Lab Results  Component Value Date   HGBA1C 5.3 12/21/2019   Last GFR: Lab Results   Component Value Date   GFRNONAA 92 12/21/2019   Patient is on Vitamin D supplement.   Lab Results  Component Value Date   VD25OH 32 12/21/2019     BMI is There is no height or weight on file to calculate BMI., she is working on diet and exercise. Wt Readings from Last 3 Encounters:  08/10/20 184 lb (83.5 kg)  07/27/20 184 lb (83.5 kg)  04/27/20 183 lb (83 kg)    Current Medications:  Current Outpatient Medications on File Prior to Visit  Medication Sig Dispense Refill  . ALPRAZolam (XANAX) 1 MG tablet TAKE 1/2 TO 1 TABLET BY MOUTH EVERY NIGHT AT BEDTIME ONLY IF NEEDED AND TRY TO LIMIT TO MAX 5 NIGHTS PER WEEK 30 tablet 0  . amphetamine-dextroamphetamine (ADDERALL) 10 MG tablet Take 1 tablet (10 mg total) by mouth 2 (two) times daily with a meal. (Patient not taking: Reported on 08/10/2020) 60 tablet 0  . ASPIRIN 81 PO Take by mouth. 3 x a week (Patient not taking: Reported on 08/10/2020)    . cholecalciferol (VITAMIN D) 1000 UNITS tablet Take 2,000 Units by mouth daily. 2000 units daily    . escitalopram (LEXAPRO) 10 MG tablet TAKE 1 TABLET BY MOUTH DAILY 30 tablet 0  . estradiol (ESTRACE) 0.5 MG tablet TAKE 1 TABLET(0.5 MG) BY MOUTH DAILY 90 tablet 3  . losartan (COZAAR) 100 MG tablet TAKE 1 TABLET(100 MG) BY MOUTH DAILY 90 tablet 2  . Multiple Vitamin (MULTIVITAMIN) tablet Take 1 tablet by mouth daily.    . vitamin C (ASCORBIC ACID) 500 MG tablet Take 500 mg by mouth daily.     No current facility-administered medications on file prior to visit.   Allergies:  Allergies  Allergen Reactions  . Doxycycline Hives   Medical History:  She has Essential hypertension; Anxiety state; Melanoma ([place]); Mixed hyperlipidemia; Osteopenia; LBBB (left bundle branch block); and Attention deficit disorder (ADD) on their problem list. Health Maintenance:   Immunization History  Administered Date(s) Administered  . Influenza Inj Mdck Quad With Preservative 09/18/2017, 09/03/2018  .  Influenza-Unspecified 08/05/2013, 09/18/2014, 11/17/2016  . Janssen (J&J) SARS-COV-2 Vaccination 12/30/2019  . Moderna SARS-COV2 Booster Vaccination 10/06/2020  . PPD Test 09/30/2014  . Td 08/30/2005  . Zoster, Live 12/18/2016    Tetanus: 2013 Pneumovax: Prevnar 13:  Flu vaccine: 2019 Zostavax: 2018  LMP: s/p hysterectomy PAP 2016 s/p hysterectomy Dr. Garwin Brothers MGM:10/2018  DEXA: 08/2016 Colonoscopy: 01/2014 due this year EGD: N/A RAS NEG 2017 CXR 2014 Lumbar Xray 2014 CT head 2003 : Patient Care Team: Unk Pinto, MD as PCP - General (Internal Medicine) Servando Salina, MD as Consulting Physician (Obstetrics and Gynecology) Adrian Prows, MD as Consulting Physician (Cardiology) Lafayette Dragon, MD (Inactive) as Consulting Physician (Gastroenterology) Jari Pigg, MD as Consulting Physician (Dermatology) Jacelyn Pi, MD as Consulting Physician (Endocrinology)  Surgical History:  She has a past surgical history that includes Partial hysterectomy (30); Tonsillectomy; Melanoma excision; Colonoscopy; and Polypectomy. Family History:  Herfamily history includes Breast cancer in her sister; Cancer in her  father, maternal grandmother, and paternal grandfather; Colon cancer in her maternal aunt; Colon polyps in her sister; Heart disease in her paternal grandmother; Hypertension in her mother; Prostate cancer in her father; Stroke in her paternal grandmother. Social History:  She reports that she has never smoked. She has never used smokeless tobacco. She reports current alcohol use of about 7.0 standard drinks of alcohol per week. She reports that she does not use drugs.  Review of Systems: Review of Systems  Constitutional:  Positive for malaise/fatigue. Negative for chills, diaphoresis, fever and weight loss.  HENT:  Positive for hearing loss and nosebleeds. Negative for congestion, ear discharge, ear pain, sinus pain, sore throat and tinnitus.   Eyes: Negative.   Negative for blurred vision, discharge and redness.  Respiratory: Negative.  Negative for cough, shortness of breath, wheezing and stridor.   Cardiovascular: Negative.  Negative for chest pain, palpitations, orthopnea and leg swelling.  Gastrointestinal:  Positive for abdominal pain (occ left upper quadrant, last seconds). Negative for blood in stool, constipation, diarrhea, heartburn, melena, nausea and vomiting.  Genitourinary: Negative.  Negative for dysuria, frequency and urgency.  Musculoskeletal:  Positive for joint pain. Negative for back pain, falls, myalgias and neck pain.  Skin: Negative.  Negative for rash.  Neurological: Negative.  Negative for dizziness, tingling, seizures, weakness and headaches.  Endo/Heme/Allergies: Negative.  Does not bruise/bleed easily.  Psychiatric/Behavioral: Negative.  Negative for depression, hallucinations, memory loss and suicidal ideas. The patient does not have insomnia.     Physical Exam: Estimated body mass index is 31.58 kg/m as calculated from the following:   Height as of 08/10/20: 5\' 4"  (1.626 m).   Weight as of 08/10/20: 184 lb (83.5 kg). There were no vitals taken for this visit. General Appearance: Well nourished, in no apparent distress.  Eyes: PERRLA, EOMs, conjunctiva no swelling or erythema, normal fundi and vessels.  Sinuses: No Frontal/maxillary tenderness  ENT/Mouth: Ext aud canals clear, normal light reflex with TMs without erythema + bilateral effusions without erythema, pus, bulging of TM. Good dentition. No erythema, swelling, or exudate on post pharynx. Tonsils not swollen or erythematous. Hearing decreased Neck: Supple, thyroid normal. No bruits  Respiratory: Respiratory effort normal, BS equal bilaterally without rales, rhonchi, wheezing or stridor.  Cardio: RRR without murmurs, rubs or gallops. Brisk peripheral pulses without edema.  Chest: symmetric, with normal excursions and percussion.  Breasts: defer Abdomen: Soft,  nontender, no guarding, rebound, hernias, masses, or organomegaly.  Lymphatics: Non tender without lymphadenopathy.  Genitourinary: defer Musculoskeletal: Full ROM all peripheral extremities,5/5 strength, and normal gait.  Skin: bilateral toes with some dryness, purplish color and no visible ulcers. Good cap refill.  Warm, dry without rashes, lesions, ecchymosis. Neuro: Cranial nerves intact, reflexes equal bilaterally. Normal muscle tone, no cerebellar symptoms. Sensation decreased Psych: Awake and oriented X 3, normal affect, Insight and Judgment appropriate.   EKG: WNL, LBBB, no ST changes. AORTA SCAN: defer  Takuma Cifelli W Vinie Charity 1:29 PM [facility]

## 2021-04-26 ENCOUNTER — Encounter: Payer: Managed Care, Other (non HMO) | Admitting: Nurse Practitioner

## 2021-05-31 NOTE — Progress Notes (Addendum)
Complete Physical  Assessment and Plan: Maureen Henderson was seen today for annual exam.  Diagnoses and all orders for this visit:  Encounter for general adult medical examination with abnormal findings       - Due annually  Essential hypertension -     CBC with Differential/Platelet -     EKG 12-Lead - - continue medications, DASH diet, exercise and monitor at home. Call if greater than 130/80.    LBBB (left bundle branch block) -     EKG 12-Lead - Continue to monitor and   Menopausal State       - Will reduce Estradiol 0.'5mg'$  to 1/2 tab three times a week with eventual goal to d/c medication  Osteopenia, unspecified location       - DEXA discussed will order once she has some anxiety issues controlled  Anxiety state -     venlafaxine XR (EFFEXOR XR) 37.5 MG 24 hr capsule; Take 1 capsule (37.5 mg total) by mouth daily. Will have her follow up in 1 month to determine medication effectiveness and if requires any dose adjustments and possible addition of Buspar  - Stressed importance of diet, exercise and therapy.  Given name of therapist today  Malignant melanoma of torso excluding breast ([place])       -  Continue to follow with dermatology, Dr Delman Cheadle  Mixed hyperlipidemia -     COMPLETE METABOLIC PANEL WITH GFR -     Lipid panel       - Stressed importance of diet and exercise   Medication management -     CBC with Differential/Platelet -     COMPLETE METABOLIC PANEL WITH GFR -     Lipid panel -     TSH -     Hemoglobin A1c -     VITAMIN D 25 Hydroxy (Vit-D Deficiency, Fractures) -     Magnesium -     Iron, Total/Total Iron Binding Cap -     EKG 12-Lead -     Urinalysis, Routine w reflex microscopic -     Microalbumin / creatinine urine ratio  Vitamin D deficiency -     VITAMIN D 25 Hydroxy (Vit-D Deficiency, Fractures)  Screening for diabetes mellitus -     Hemoglobin A1c  Screening, anemia, deficiency, iron -     Iron, Total/Total Iron Binding Cap  Screening for thyroid  disorder -     TSH  Screening for hematuria or proteinuria -     Urinalysis, Routine w reflex microscopic -     Microalbumin / creatinine urine ratio  Overweight with body mass index (BMI) of 27 to 27.9 in adult       - Long conversation about importance of self care which includes diet and exercise  23-polyvalent pneumococcal polysaccharide vaccine administered -     Pneumococcal polysaccharide vaccine 23-valent greater than or equal to 2yo subcutaneous/IM      Discussed med's effects and SE's. Screening labs and tests as requested with regular follow-up as recommended. Over 40 minutes of exam, counseling, chart review, and complex, high level critical decision making was performed this visit.   HPI  65 y.o. female  presents for a complete physical and follow up for has Essential hypertension; Anxiety state; Melanoma ([place]); Mixed hyperlipidemia; Osteopenia; LBBB (left bundle branch block); and Attention deficit disorder (ADD) on their problem list..  Her mom has dementia, is a stressor for her, will go to visit frequently and stressed with this.  Had history of  ADD and is taking it irregularly.   She has chronic fatigue, seems like she is having more problems with energy.She does take care of mother with dementia but she lives [address] a way.  Someone does sit with her during the week. Stopped working 2 years ago and it is hard not having that part in her life. Trying to find purpose for her life and is doing meditation and deep breathing.  She has more insomnia and anxiety. Stopped her Lexapro but was worried about the long term use.    She complains of varicose veins with some pain associated.  Does have some numbness of toes. Does have some purple discoloration of toes. Occ coldness of hands and feet.    Her blood pressure has been controlled at home, today their BP is BP: (!) 160/90. [place] coat hypertension, anxiety with having labs drawn.  BP this morning 107/82.  Bp has been  running 100's over 70-80 BP Readings from Last 3 Encounters:  06/05/21 (!) 160/90  09/16/20 (!) 161/90  08/10/20 (!) 169/87    She does not workout. Does Wi fit, walking. She denies chest pain, shortness of breath, dizziness.  LBBB X 2003,  Normal stress test at that time.   She is not on cholesterol medication and denies myalgias. Her cholesterol is at goal. The cholesterol last visit was:   Lab Results  Component Value Date   CHOL 199 12/21/2019   HDL 85 12/21/2019   LDLCALC 98 12/21/2019   TRIG 75 12/21/2019   CHOLHDL 2.3 12/21/2019   Last A1C in the office was:  Lab Results  Component Value Date   HGBA1C 5.3 12/21/2019   Last GFR: Lab Results  Component Value Date   GFRNONAA 92 12/21/2019   Patient is on Vitamin D supplement.   Lab Results  Component Value Date   VD25OH 32 12/21/2019     BMI is Body mass index is 27.64 kg/m., she is working on diet and exercise. Wt Readings from Last 3 Encounters:  06/05/21 161 lb (73 kg)  08/10/20 184 lb (83.5 kg)  07/27/20 184 lb (83.5 kg)    Current Medications:  Current Outpatient Medications on File Prior to Visit  Medication Sig Dispense Refill   ALPRAZolam (XANAX) 1 MG tablet TAKE 1/2 TO 1 TABLET BY MOUTH EVERY NIGHT AT BEDTIME ONLY IF NEEDED AND TRY TO LIMIT TO MAX 5 NIGHTS PER WEEK 30 tablet 0   amphetamine-dextroamphetamine (ADDERALL) 10 MG tablet Take 1 tablet (10 mg total) by mouth 2 (two) times daily with a meal. 60 tablet 0   ASPIRIN 81 PO Take by mouth. 3 x a week     cholecalciferol (VITAMIN D) 1000 UNITS tablet Take 2,000 Units by mouth daily. 2000 units daily     estradiol (ESTRACE) 0.5 MG tablet TAKE 1 TABLET(0.5 MG) BY MOUTH DAILY 90 tablet 3   losartan (COZAAR) 100 MG tablet TAKE 1 TABLET(100 MG) BY MOUTH DAILY 90 tablet 2   Multiple Vitamin (MULTIVITAMIN) tablet Take 1 tablet by mouth daily.     vitamin C (ASCORBIC ACID) 500 MG tablet Take 500 mg by mouth daily. (Patient not taking: Reported on 06/05/2021)      No current facility-administered medications on file prior to visit.   Allergies:  Allergies  Allergen Reactions   Doxycycline Hives   Medical History:  She has Essential hypertension; Anxiety state; Melanoma ([place]); Mixed hyperlipidemia; Osteopenia; LBBB (left bundle branch block); and Attention deficit disorder (ADD) on their problem  list. Health Maintenance:   Immunization History  Administered Date(s) Administered   Influenza Inj Mdck Quad With Preservative 09/18/2017, 09/03/2018   Influenza-Unspecified 08/05/2013, 09/18/2014, 11/17/2016   Janssen (J&J) SARS-COV-2 Vaccination 12/30/2019   Moderna SARS-COV2 Booster Vaccination 10/06/2020   PPD Test 09/30/2014   Pneumococcal Polysaccharide-23 06/05/2021   Td 08/30/2005   Zoster, Live 12/18/2016    Tetanus: 2013 Pneumovax: today  Prevnar 13:  Flu vaccine: 2019 Zostavax: 2018  LMP: s/p hysterectomy PAP 2016 s/p hysterectomy Dr. Garwin Brothers MGM: 04/05/21 negative repeat 1 year  DEXA: 08/2016  Osteopenia Colonoscopy: 08/10/20 precancerous polyps wants repeat in 1 year as were unable to visualize left side of colon EGD: N/A RAS NEG 2017 CXR 2014 Lumbar Xray 2014 CT head 2003 Eye Doctor: 09/2020 [facility] 06/2021 next appointment  Patient Care Team: Unk Pinto, MD as PCP - General (Internal Medicine) Servando Salina, MD as Consulting Physician (Obstetrics and Gynecology) Adrian Prows, MD as Consulting Physician (Cardiology) Lafayette Dragon, MD (Inactive) as Consulting Physician (Gastroenterology) Jari Pigg, MD as Consulting Physician (Dermatology) Jacelyn Pi, MD as Consulting Physician (Endocrinology)  Surgical History:  She has a past surgical history that includes Partial hysterectomy (30); Tonsillectomy; Melanoma excision; Colonoscopy; and Polypectomy. Family History:  Herfamily history includes Breast cancer in her sister; Cancer in her father, maternal grandmother, and  paternal grandfather; Colon cancer in her maternal aunt; Colon polyps in her sister; Heart disease in her paternal grandmother; Hypertension in her mother; Prostate cancer in her father; Stroke in her paternal grandmother. Social History:  She reports that she has never smoked. She has never used smokeless tobacco. She reports current alcohol use of about 7.0 standard drinks per week. She reports that she does not use drugs.  Review of Systems: Review of Systems  Constitutional:  Positive for malaise/fatigue. Negative for chills, diaphoresis, fever and weight loss.  HENT:  Positive for hearing loss and tinnitus. Negative for congestion, ear discharge, ear pain, nosebleeds, sinus pain and sore throat.   Eyes: Negative.  Negative for blurred vision and double vision.  Respiratory: Negative.  Negative for cough, shortness of breath and stridor.   Cardiovascular: Negative.  Negative for chest pain, palpitations, orthopnea and leg swelling.  Gastrointestinal:  Negative for abdominal pain (occ left upper quadrant, last seconds), blood in stool, constipation, diarrhea, heartburn, melena, nausea and vomiting.  Genitourinary: Negative.   Musculoskeletal:  Negative for back pain, falls, joint pain, myalgias and neck pain.  Skin: Negative.  Negative for rash.  Neurological:  Positive for tingling (toes bilaterally). Negative for dizziness, tremors, loss of consciousness, weakness and headaches.  Endo/Heme/Allergies: Negative.   Psychiatric/Behavioral:  Negative for depression, memory loss and suicidal ideas. The patient is nervous/anxious and has insomnia.    Physical Exam: Estimated body mass index is 27.64 kg/m as calculated from the following:   Height as of this encounter: '5\' 4"'$  (1.626 m).   Weight as of this encounter: 161 lb (73 kg). BP (!) 160/90   Pulse 78   Temp (!) 97.3 F (36.3 C)   Resp 16   Ht '5\' 4"'$  (1.626 m)   Wt 161 lb (73 kg)   SpO2 97%   BMI 27.64 kg/m  General Appearance:  Well nourished, in no apparent distress.  Eyes: PERRLA, EOMs, conjunctiva no swelling or erythema, normal fundi and vessels.  Sinuses: No Frontal/maxillary tenderness  ENT/Mouth: Ext aud canals clear, normal light reflex with TMs without erythema + bilateral effusions without erythema, pus, bulging of TM. Good  dentition. No erythema, swelling, or exudate on post pharynx. Tonsils not swollen or erythematous. Hearing decreased Neck: Supple, thyroid normal. No bruits  Respiratory: Respiratory effort normal, BS equal bilaterally without rales, rhonchi, wheezing or stridor.  Cardio: RRR without murmurs, rubs or gallops. Brisk peripheral pulses without edema.  Chest: symmetric, with normal excursions and percussion.  Breasts: defer Abdomen: Soft, nontender, no guarding, rebound, hernias, masses, or organomegaly.  Lymphatics: Non tender without lymphadenopathy.  Genitourinary: defer Musculoskeletal: Full ROM all peripheral extremities,5/5 strength, and normal gait.  Skin: bilateral toes with some dryness, purplish color and no visible ulcers. Good cap refill.  Warm, dry without rashes, lesions, ecchymosis. Neuro: Cranial nerves intact, reflexes equal bilaterally. Normal muscle tone, no cerebellar symptoms. Sensation decreased Psych: Awake and oriented X 3, normal affect, Insight and Judgment appropriate.   EKG: WNL, LBBB, no ST changes.   Yigit Norkus W Yehuda Printup 12:34 PM [facility]

## 2021-06-05 ENCOUNTER — Ambulatory Visit (INDEPENDENT_AMBULATORY_CARE_PROVIDER_SITE_OTHER): Payer: Medicare Other | Admitting: Nurse Practitioner

## 2021-06-05 ENCOUNTER — Other Ambulatory Visit: Payer: Self-pay

## 2021-06-05 ENCOUNTER — Encounter: Payer: Self-pay | Admitting: Nurse Practitioner

## 2021-06-05 VITALS — BP 160/90 | HR 78 | Temp 97.3°F | Resp 16 | Ht 64.0 in | Wt 161.0 lb

## 2021-06-05 DIAGNOSIS — Z79890 Hormone replacement therapy (postmenopausal): Secondary | ICD-10-CM

## 2021-06-05 DIAGNOSIS — E559 Vitamin D deficiency, unspecified: Secondary | ICD-10-CM

## 2021-06-05 DIAGNOSIS — Z23 Encounter for immunization: Secondary | ICD-10-CM

## 2021-06-05 DIAGNOSIS — Z130 Encounter for screening for diseases of the blood and blood-forming organs and certain disorders involving the immune mechanism: Secondary | ICD-10-CM

## 2021-06-05 DIAGNOSIS — Z1329 Encounter for screening for other suspected endocrine disorder: Secondary | ICD-10-CM

## 2021-06-05 DIAGNOSIS — I447 Left bundle-branch block, unspecified: Secondary | ICD-10-CM

## 2021-06-05 DIAGNOSIS — Z136 Encounter for screening for cardiovascular disorders: Secondary | ICD-10-CM

## 2021-06-05 DIAGNOSIS — I10 Essential (primary) hypertension: Secondary | ICD-10-CM

## 2021-06-05 DIAGNOSIS — Z0000 Encounter for general adult medical examination without abnormal findings: Secondary | ICD-10-CM

## 2021-06-05 DIAGNOSIS — C4359 Malignant melanoma of other part of trunk: Secondary | ICD-10-CM

## 2021-06-05 DIAGNOSIS — E663 Overweight: Secondary | ICD-10-CM

## 2021-06-05 DIAGNOSIS — E782 Mixed hyperlipidemia: Secondary | ICD-10-CM

## 2021-06-05 DIAGNOSIS — Z79899 Other long term (current) drug therapy: Secondary | ICD-10-CM

## 2021-06-05 DIAGNOSIS — Z6827 Body mass index (BMI) 27.0-27.9, adult: Secondary | ICD-10-CM

## 2021-06-05 DIAGNOSIS — Z0001 Encounter for general adult medical examination with abnormal findings: Secondary | ICD-10-CM

## 2021-06-05 DIAGNOSIS — Z131 Encounter for screening for diabetes mellitus: Secondary | ICD-10-CM

## 2021-06-05 DIAGNOSIS — F411 Generalized anxiety disorder: Secondary | ICD-10-CM

## 2021-06-05 DIAGNOSIS — M8580 Other specified disorders of bone density and structure, unspecified site: Secondary | ICD-10-CM

## 2021-06-05 DIAGNOSIS — Z1389 Encounter for screening for other disorder: Secondary | ICD-10-CM

## 2021-06-05 MED ORDER — LOSARTAN POTASSIUM 100 MG PO TABS: ORAL_TABLET | ORAL | 2 refills | Status: DC

## 2021-06-05 MED ORDER — VENLAFAXINE HCL ER 37.5 MG PO CP24: 37.5000 mg | ORAL_CAPSULE | Freq: Every day | ORAL | 0 refills | Status: DC

## 2021-06-05 MED ORDER — ESTRADIOL 0.5 MG PO TABS: ORAL_TABLET | ORAL | 3 refills | Status: DC

## 2021-06-05 NOTE — Patient Instructions (Addendum)
Venlafaxine Tablets What is this medication? VENLAFAXINE (VEN la fax een) treats depression and anxiety. It increases the amount of serotonin and norepinephrine in the brain, hormones that helpregulate mood. It belongs to a group of medications called SNRIs. This medicine may be used for other purposes; ask your health care provider orpharmacist if you have questions. COMMON BRAND NAME(S): Effexor What should I tell my care team before I take this medication? They need to know if you have any of these conditions: Bleeding disorders Glaucoma Heart disease High blood pressure High cholesterol Kidney disease Liver disease Low levels of sodium in the blood Mania or bipolar disorder Seizures Suicidal thoughts, plans, or attempt; a previous suicide attempt by you or a family member Take medications that treat or prevent blood clots Thyroid disease An unusual or allergic reaction to venlafaxine, desvenlafaxine, other medications, foods, dyes, or preservatives Pregnant or trying to get pregnant Breast-feeding How should I use this medication? Take this medication by mouth with a glass of water. Follow the directions on the prescription label. Take it with food. Take your medication at regular intervals. Do not take your medication more often than directed. Do not stop taking this medication suddenly except upon the advice of your care team. Stopping this medication too quickly may cause serious side effects or yourcondition may worsen. A special MedGuide will be given to you by the pharmacist with eachprescription and refill. Be sure to read this information carefully each time. Talk to your care team regarding the use of this medication in children.Special care may be needed. Overdosage: If you think you have taken too much of this medicine contact apoison control center or emergency room at once. NOTE: This medicine is only for you. Do not share this medicine with others. What if I miss a  dose? If you miss a dose, take it as soon as you can. If it is almost time for yournext dose, take only that dose. Do not take double or extra doses. What may interact with this medication? Do not take this medication with any of the following: Certain medications for fungal infections like fluconazole, itraconazole, ketoconazole, posaconazole, voriconazole Cisapride Desvenlafaxine Dronedarone Duloxetine Levomilnacipran Linezolid MAOIs like Carbex, Eldepryl, Marplan, Nardil, and Parnate Methylene blue (injected into a vein) Milnacipran Pimozide Thioridazine This medication may also interact with the following: Amphetamines Aspirin and aspirin-like medications Certain medications for depression, anxiety, or psychotic disturbances Certain medications for migraine headaches like almotriptan, eletriptan, frovatriptan, naratriptan, rizatriptan, sumatriptan, zolmitriptan Certain medications for sleep Certain medications that treat or prevent blood clots like dalteparin, enoxaparin, warfarin Cimetidine Clozapine Diuretics Fentanyl Furazolidone Indinavir Isoniazid Lithium Metoprolol NSAIDS, medications for pain and inflammation, like ibuprofen or naproxen Other medications that prolong the QT interval (cause an abnormal heart rhythm) like dofetilide, ziprasidone Procarbazine Rasagiline Supplements like St. John's wort, kava kava, valerian Tramadol Tryptophan This list may not describe all possible interactions. Give your health care provider a list of all the medicines, herbs, non-prescription drugs, or dietary supplements you use. Also tell them if you smoke, drink alcohol, or use illegaldrugs. Some items may interact with your medicine. What should I watch for while using this medication? Tell your care team if your symptoms do not get better or if they get worse. Visit your care team for regular checks on your progress. Because it may take several weeks to see the full effects of  this medication, it is important tocontinue your treatment as prescribed by your care team. Watch for new or worsening thoughts  of suicide or depression. This includes sudden changes in mood, behaviors, or thoughts. These changes can happen at any time but are more common in the beginning of treatment or after a change in dose. Call your care team right away if you experience these thoughts orworsening depression. Manic episodes may happen in patients with bipolar disorder who take this medication. Watch for changes in feelings or behaviors such as feeling anxious, nervous, agitated, panicky, irritable, hostile, aggressive, impulsive, severely restless, overly excited and hyperactive, or trouble sleeping. These changes can happen at any time but are more common in the beginning of treatment or after a change in dose. Call your care team right away if you notice any ofthese symptoms. This medication can cause an increase in blood pressure. Check with your care team for instructions on monitoring your blood pressure while taking thismedication. You may get drowsy or dizzy. Do not drive, use machinery, or do anything that needs mental alertness until you know how this medication affects you. Do not stand or sit up quickly, especially if you are an older patient. This reduces the risk of dizzy or fainting spells. Alcohol may interfere with the effect ofthis medication. Avoid alcoholic drinks. Your mouth may get dry. Chewing sugarless gum, sucking hard candy and drinking plenty of water will help. Contact your care team, if the problem does not goaway or is severe. What side effects may I notice from receiving this medication? Side effects that you should report to your care team as soon as possible: Allergic reactions-skin rash, itching, hives, swelling of the face, lips, tongue, or throat Bleeding-bloody or black, tar-like stools, red or dark brown urine, vomiting blood or brown material that looks like coffee  grounds, small, red or purple spots on skin, unusual bleeding or bruising Heart rhythm changes-fast or irregular heartbeat, dizziness, feeling faint or lightheaded, chest pain, trouble breathing Increase in blood pressure Loss of appetite with weight loss Low sodium level-muscle weakness, fatigue, dizziness, headache, confusion Serotonin syndrome-irritability, confusion, fast or irregular heartbeat, muscle stiffness, twitching muscles, sweating, high fever, seizures, chills, vomiting, diarrhea Sudden eye pain or change in vision such as blurry vision, seeing halos around lights, vision loss Thoughts of suicide or self-harm, worsening mood, feelings of depression Side effects that usually do not require medical attention (report to your careteam if they continue or are bothersome): Anxiety, nervousness Change in sex drive or performance Dizziness Dry mouth Excessive sweating Nausea Tremors or shaking Trouble sleeping This list may not describe all possible side effects. Call your doctor for medical advice about side effects. You may report side effects to FDA at1-800-FDA-1088. Where should I keep my medication? Keep out of the reach of children and pets. Store at a controlled temperature between 20 and 25 degrees C (68 and 77 degrees F), in a dry place. Throw away any unused medication after theexpiration date. NOTE: This sheet is a summary. It may not cover all possible information. If you have questions about this medicine, talk to your doctor, pharmacist, orhealth care provider.  2022 [employer]vier/Gold Standard (2020-10-19 17:16:17) [id number]

## 2021-06-05 NOTE — Addendum Note (Signed)
Addended by: Magda Bernheim on: 06/05/2021 12:49 PM   Modules accepted: Orders

## 2021-06-06 LAB — COMPLETE METABOLIC PANEL WITH GFR
AG Ratio: 1.9 (calc) (ref 1.0–2.5)
ALT: 27 U/L (ref 6–29)
AST: 17 U/L (ref 10–35)
Albumin: 4.6 g/dL (ref 3.6–5.1)
Alkaline phosphatase (APISO): 77 U/L (ref 37–153)
BUN: 11 mg/dL (ref 7–25)
CO2: 28 mmol/L (ref 20–32)
Calcium: 10 mg/dL (ref 8.6–10.4)
Chloride: 101 mmol/L (ref 98–110)
Creat: 0.76 mg/dL (ref 0.50–1.05)
Globulin: 2.4 g/dL (calc) (ref 1.9–3.7)
Glucose, Bld: 86 mg/dL (ref 65–99)
Potassium: 4.1 mmol/L (ref 3.5–5.3)
Sodium: 139 mmol/L (ref 135–146)
Total Bilirubin: 0.6 mg/dL (ref 0.2–1.2)
Total Protein: 7 g/dL (ref 6.1–8.1)
eGFR: 87 mL/min/{1.73_m2} (ref >=60)

## 2021-06-06 LAB — URINALYSIS, ROUTINE W REFLEX MICROSCOPIC
Bacteria, UA: NONE SEEN /HPF
Bilirubin Urine: NEGATIVE
Glucose, UA: NEGATIVE
Hgb urine dipstick: NEGATIVE
Hyaline Cast: NONE SEEN /LPF
Ketones, ur: NEGATIVE
Nitrite: NEGATIVE
Protein, ur: NEGATIVE
RBC / HPF: NONE SEEN /HPF (ref 0–2)
Specific Gravity, Urine: 1.008 (ref 1.001–1.035)
Squamous Epithelial / HPF: NONE SEEN /HPF (ref <=5)
pH: 5.5 (ref 5.0–8.0)

## 2021-06-06 LAB — LIPID PANEL
Cholesterol: 243 mg/dL — ABNORMAL HIGH (ref <200)
HDL: 81 mg/dL (ref >=50)
LDL Cholesterol (Calc): 135 mg/dL (calc) — ABNORMAL HIGH
Non-HDL Cholesterol (Calc): 162 mg/dL (calc) — ABNORMAL HIGH (ref <130)
Total CHOL/HDL Ratio: 3 (calc) (ref <5.0)
Triglycerides: 148 mg/dL (ref <150)

## 2021-06-06 LAB — CBC WITH DIFFERENTIAL/PLATELET
Absolute Monocytes: 554 cells/uL (ref 200–950)
Basophils Absolute: 53 cells/uL (ref 0–200)
Basophils Relative: 0.8 %
Eosinophils Absolute: 99 cells/uL (ref 15–500)
Eosinophils Relative: 1.5 %
HCT: 49.9 % — ABNORMAL HIGH (ref 35.0–45.0)
Hemoglobin: 16.7 g/dL — ABNORMAL HIGH (ref 11.7–15.5)
Lymphs Abs: 2013 cells/uL (ref 850–3900)
MCH: 30.4 pg (ref 27.0–33.0)
MCHC: 33.5 g/dL (ref 32.0–36.0)
MCV: 90.9 fL (ref 80.0–100.0)
MPV: 9.7 fL (ref 7.5–12.5)
Monocytes Relative: 8.4 %
Neutro Abs: 3881 cells/uL (ref 1500–7800)
Neutrophils Relative %: 58.8 %
Platelets: 322 10*3/uL (ref 140–400)
RBC: 5.49 10*6/uL — ABNORMAL HIGH (ref 3.80–5.10)
RDW: 12.4 % (ref 11.0–15.0)
Total Lymphocyte: 30.5 %
WBC: 6.6 10*3/uL (ref 3.8–10.8)

## 2021-06-06 LAB — MICROALBUMIN / CREATININE URINE RATIO
Creatinine, Urine: 60 mg/dL (ref 20–275)
Microalb Creat Ratio: 5 mcg/mg creat (ref <30)
Microalb, Ur: 0.3 mg/dL

## 2021-06-06 LAB — HEMOGLOBIN A1C
Hgb A1c MFr Bld: 5.3 % of total Hgb (ref <5.7)
Mean Plasma Glucose: 105 mg/dL
eAG (mmol/L): 5.8 mmol/L

## 2021-06-06 LAB — IRON, TOTAL/TOTAL IRON BINDING CAP
%SAT: 45 % (calc) (ref 16–45)
Iron: 165 ug/dL — ABNORMAL HIGH (ref 45–160)
TIBC: 365 mcg/dL (calc) (ref 250–450)

## 2021-06-06 LAB — MAGNESIUM: Magnesium: 2.5 mg/dL (ref 1.5–2.5)

## 2021-06-06 LAB — VITAMIN D 25 HYDROXY (VIT D DEFICIENCY, FRACTURES): Vit D, 25-Hydroxy: 39 ng/mL (ref 30–100)

## 2021-06-06 LAB — TSH: TSH: 1.36 mIU/L (ref 0.40–4.50)

## 2021-06-06 LAB — MICROSCOPIC MESSAGE

## 2021-06-28 LAB — HM DEXA SCAN

## 2021-07-02 NOTE — Progress Notes (Addendum)
MEDICARE ANNUAL WELLNESS VISIT AND FOLLOW UP  Assessment:   Maureen Henderson was seen today for medicare wellness.  Diagnoses and all orders for this visit:  Encounter for initial annual wellness visit (AWV) in Medicare patient  Essential hypertension -     CBC with Differential/Platelet -     losartan (COZAAR) 100 MG tablet; TAKE 1/2-1 TABLET(100 MG) BY MOUTH DAILY  Malignant melanoma of torso excluding breast ([place])  Mammogram annually  Mixed hyperlipidemia -     COMPLETE METABOLIC PANEL WITH GFR  Continue diet and exercise  Vitamin D deficiency  Continue Vit D supplementation  Anxiety state -     busPIRone (BUSPAR) 15 MG tablet; Take 1/2-1 tab daily -     venlafaxine XR (EFFEXOR XR) 37.5 MG 24 hr capsule; Take 1 capsule (37.5 mg total) by mouth daily.  Strongly encouraged to make appt with Deniece Ree for therapy  Attention deficit hyperactivity disorder (ADHD), predominantly inattentive type  Encouraged to restart Adderall 10 mg BID  Urine abnormality -     Urinalysis, Routine w reflex microscopic -     Urine Culture  Elevated hemoglobin (HCC) -     CBC with Differential/Platelet -     Pathologist smear review  Current severe episode of major depressive disorder without psychotic features, unspecified whether recurrent (HCC) -     venlafaxine XR (EFFEXOR XR) 37.5 MG 24 hr capsule; Take 1 capsule (37.5 mg total) by mouth daily. 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  Strongly encouraged to schedule appt with therapist to discuss issues. - Wanted to increase dosage of Effexor but pt refuses so did encourage she restart Adderall and she agrees.  Will take Adderall in AM and Effexor in PM She is to call if symptoms do not improve Instructed patient to contact office or on-call physician promptly should condition worsen or any new symptoms appear. IF THE PATIENT HAS ANY SUICIDAL OR HOMICIDAL IDEATIONS, CALL THE  OFFICE, DISCUSS WITH A SUPPORT MEMBER, OR GO TO THE ER IMMEDIATELY. Patient was agreeable with this plan.   Daytime Somnolence/Snoring/Chronic fatigue Will refer for sleep study.  Elevated hemoglobin Could be result of sleep apnea, will refer for sleep study    Over 40 minutes of exam, counseling, chart review and critical decision making was performed Future Appointments  Date Time Provider [place]  10/05/2021 11:00 AM Magda Bernheim, NP [facility] None  06/06/2022 10:00 AM Magda Bernheim, NP [facility] None     Plan:   During the course of the visit the patient was educated and counseled about appropriate screening and preventive services including:   Pneumococcal vaccine  Prevnar 13 Influenza vaccine Td vaccine Screening electrocardiogram Bone densitometry screening Colorectal cancer screening Diabetes screening Glaucoma screening Nutrition counseling  Advanced directives: requested   Subjective:  Maureen Henderson is a 65 y.o. female who presents for Medicare Annual Wellness Visit and 3 month follow up.   Pt continues to struggle with depression , having some noticed improvement with Effexor.  She continues to have a lot of day time sleepiness and fatigue.  Husband has noticed she does occasionally snore.  Never feels rested.  Falls asleep easily during the day.    She has history of elevated blood pressure Her blood pressure has been controlled at home, today their BP is BP: 138/84 BP Readings from Last 3 Encounters:  07/03/21 138/84  06/05/21 (!) 160/90  09/16/20 (!) 161/90  She does workout. She denies chest pain, shortness of breath, dizziness.   BMI is Body mass index is 28.29 kg/m., she has been working on diet and exercise. Wt Readings from Last 3 Encounters:  07/03/21 164 lb 12.8 oz (74.8 kg)  06/05/21 161 lb (73 kg)  08/10/20 184 lb (83.5 kg)    She is not on cholesterol medication . Her cholesterol is not at goal. The cholesterol last visit was:    Lab Results  Component Value Date   CHOL 243 (H) 06/05/2021   HDL 81 06/05/2021   LDLCALC 135 (H) 06/05/2021   TRIG 148 06/05/2021   CHOLHDL 3.0 06/05/2021    Last A1C in the office was:  Lab Results  Component Value Date   HGBA1C 5.3 06/05/2021   Last GFR:   Lab Results  Component Value Date   GFRNONAA 92 12/21/2019    Patient is on Vitamin D supplement.   Lab Results  Component Value Date   VD25OH 39 06/05/2021      Medication Review: Current Outpatient Medications on File Prior to Visit  Medication Sig Dispense Refill   ASPIRIN 81 PO Take by mouth. 3 x a week     cholecalciferol (VITAMIN D) 1000 UNITS tablet Take 2,000 Units by mouth daily. 2000 units daily     estradiol (ESTRACE) 0.5 MG tablet TAKE 1/2 TABLET(0.5 MG) BY MOUTH THREE TIMES A WEEK 90 tablet 3   Multiple Vitamin (MULTIVITAMIN) tablet Take 1 tablet by mouth daily.     amphetamine-dextroamphetamine (ADDERALL) 10 MG tablet Take 1 tablet (10 mg total) by mouth 2 (two) times daily with a meal. 60 tablet 0   No current facility-administered medications on file prior to visit.    Allergies  Allergen Reactions   Doxycycline Hives    Current Problems (verified) Patient Active Problem List   Diagnosis Date Noted   Attention deficit disorder (ADD) 12/22/2019   Osteopenia 01/04/2016   LBBB (left bundle branch block) 01/04/2016   Mixed hyperlipidemia 06/15/2015   Melanoma ([place])    Essential hypertension 08/31/2013   Anxiety state 08/31/2013    Screening Tests Immunization History  Administered Date(s) Administered   Influenza Inj Mdck Quad With Preservative 09/18/2017, 09/03/2018   Influenza-Unspecified 08/05/2013, 09/18/2014, 11/17/2016   Janssen (J&J) SARS-COV-2 Vaccination 12/30/2019, 12/30/2019   Moderna SARS-COV2 Booster Vaccination 10/06/2020, 10/06/2020   PPD Test 09/30/2014   Pneumococcal Polysaccharide-23 06/05/2021   Td 08/30/2005   Zoster, Live 12/18/2016    Preventative care: Last  colonoscopy: 08/10/20, due 1 year Last mammogram: 04/05/21 Last pap smear/pelvic exam: a long time , TAH   DEXA:06/28/21 osteopenia  Prior vaccinations: TD or Tdap: 2006 declines  Influenza:   Pneumococcal: 06/05/21 Prevnar13:  Shingles/Zostavax: 11/2016 Zostavax Shingrix:    Names of Other Physician/Practitioners you currently use: 1. [facility] here for primary care 2. , eye doctor, last visit 2021 3. , dentist, last visit 2021 Patient Care Team: Unk Pinto, MD as PCP - General (Internal Medicine) Servando Salina, MD as Consulting Physician (Obstetrics and Gynecology) Adrian Prows, MD as Consulting Physician (Cardiology) Lafayette Dragon, MD (Inactive) as Consulting Physician (Gastroenterology) Jari Pigg, MD as Consulting Physician (Dermatology) Jacelyn Pi, MD as Consulting Physician (Endocrinology)  SURGICAL HISTORY She  has a past surgical history that includes Partial hysterectomy (30); Tonsillectomy; Melanoma excision; Colonoscopy; and Polypectomy. FAMILY HISTORY Her family history includes Breast cancer in her sister; Cancer in her father, maternal grandmother, and paternal grandfather; Colon cancer in her  maternal aunt; Colon polyps in her sister; Heart disease in her paternal grandmother; Hypertension in her mother; Prostate cancer in her father; Stroke in her paternal grandmother. SOCIAL HISTORY She  reports that she has never smoked. She has never used smokeless tobacco. She reports current alcohol use of about 7.0 standard drinks per week. She reports that she does not use drugs.   MEDICARE WELLNESS OBJECTIVES: Physical activity: Current Exercise Habits: Home exercise routine, Type of exercise: calisthenics;yoga;strength training/weights, Time (Minutes): 20, Frequency (Times/Week): 5, Weekly Exercise (Minutes/Week): 100, Exercise limited by: psychological condition(s) Cardiac risk factors: Cardiac Risk Factors include:  dyslipidemia;hypertension;sedentary lifestyle Depression/mood screen:   Depression screen [facility] 2/9 07/03/2021  Decreased Interest 3  Down, Depressed, Hopeless 3  PHQ - 2 Score 6  Altered sleeping 3  Tired, decreased energy 3  Change in appetite 3  Feeling bad or failure about yourself  1  Trouble concentrating 3  Moving slowly or fidgety/restless 3  Suicidal thoughts 1  PHQ-9 Score 23  Difficult doing work/chores Somewhat difficult    ADLs:  In your present state of health, do you have any difficulty performing the following activities: 07/03/2021  Hearing? N  Vision? N  Difficulty concentrating or making decisions? N  Walking or climbing stairs? N  Dressing or bathing? N  Doing errands, shopping? N  Some recent data might be hidden     Cognitive Testing  Alert? Yes  Normal Appearance?Yes  Oriented to person? Yes  Place? Yes   Time? Yes  Recall of three objects?  Yes  Can perform simple calculations? Yes  Displays appropriate judgment?Yes  Can read the correct time from a watch face?Yes  EOL planning: Does Patient Have a Medical Advance Directive?: Yes Type of Advance Directive: Healthcare Power of Attorney, Living will Does patient want to make changes to medical advance directive?: No - Patient declined Copy of [place] in Chart?: No - copy requested  Review of Systems  Constitutional:  Positive for malaise/fatigue. Negative for chills, fever and weight loss.  HENT:  Negative for congestion and hearing loss.   Eyes:  Negative for blurred vision and double vision.  Respiratory:  Negative for cough and shortness of breath.   Cardiovascular:  Negative for chest pain, palpitations, orthopnea and leg swelling.  Gastrointestinal:  Negative for abdominal pain, constipation, diarrhea, heartburn, nausea and vomiting.  Musculoskeletal:  Negative for falls, joint pain and myalgias.  Skin:  Negative for rash.  Neurological:  Negative for dizziness, tingling,  tremors, loss of consciousness and headaches.  Psychiatric/Behavioral:  Positive for depression (noticing improvement with Effexor). Negative for memory loss and suicidal ideas.     Objective:     Today's Vitals   07/03/21 1436  BP: 138/84  Pulse: 76  Temp: (!) 97.5 F (36.4 C)  SpO2: 96%  Weight: 164 lb 12.8 oz (74.8 kg)   Body mass index is 28.29 kg/m.  General appearance: alert, no distress, WD/WN, female HEENT: normocephalic, sclerae anicteric, TMs pearly, nares patent, no discharge or erythema, pharynx normal Oral cavity: MMM, no lesions Neck: supple, no lymphadenopathy, no thyromegaly, no masses Heart: RRR, normal S1, S2, no murmurs Lungs: CTA bilaterally, no wheezes, rhonchi, or rales Abdomen: +bs, soft, non tender, non distended, no masses, no hepatomegaly, no splenomegaly Musculoskeletal: nontender, no swelling, no obvious deformity Extremities: no edema, no cyanosis, no clubbing Pulses: 2+ symmetric, upper and lower extremities, normal cap refill Neurological: alert, oriented x 3, CN2-12 intact, strength normal upper extremities and lower extremities,  sensation normal throughout, DTRs 2+ throughout, no cerebellar signs, gait normal Psychiatric: normal affect, behavior normal, pleasant  EKG: deferred did last month AAA: deferred insurance will not cover  Medicare Attestation I have personally reviewed: The patient's medical and social history Their use of alcohol, tobacco or illicit drugs Their current medications and supplements The patient's functional ability including ADLs,fall risks, home safety risks, cognitive, and hearing and visual impairment Diet and physical activities Evidence for depression or mood disorders  The patient's weight, height, BMI, and visual acuity have been recorded in the chart.  I have made referrals, counseling, and provided education to the patient based on review of the above and I have provided the patient with a written personalized  care plan for preventive services.    Magda Bernheim ANP-C  [facility]  07/03/2021

## 2021-07-03 ENCOUNTER — Other Ambulatory Visit: Payer: Self-pay

## 2021-07-03 ENCOUNTER — Encounter: Payer: Self-pay | Admitting: Nurse Practitioner

## 2021-07-03 ENCOUNTER — Ambulatory Visit (INDEPENDENT_AMBULATORY_CARE_PROVIDER_SITE_OTHER): Payer: Medicare Other | Admitting: Nurse Practitioner

## 2021-07-03 VITALS — BP 138/84 | HR 76 | Temp 97.5°F | Wt 164.8 lb

## 2021-07-03 DIAGNOSIS — Z0000 Encounter for general adult medical examination without abnormal findings: Secondary | ICD-10-CM

## 2021-07-03 DIAGNOSIS — C4359 Malignant melanoma of other part of trunk: Secondary | ICD-10-CM | POA: Diagnosis not present

## 2021-07-03 DIAGNOSIS — Z79899 Other long term (current) drug therapy: Secondary | ICD-10-CM

## 2021-07-03 DIAGNOSIS — F411 Generalized anxiety disorder: Secondary | ICD-10-CM

## 2021-07-03 DIAGNOSIS — F900 Attention-deficit hyperactivity disorder, predominantly inattentive type: Secondary | ICD-10-CM

## 2021-07-03 DIAGNOSIS — Z136 Encounter for screening for cardiovascular disorders: Secondary | ICD-10-CM

## 2021-07-03 DIAGNOSIS — I10 Essential (primary) hypertension: Secondary | ICD-10-CM | POA: Diagnosis not present

## 2021-07-03 DIAGNOSIS — E782 Mixed hyperlipidemia: Secondary | ICD-10-CM | POA: Diagnosis not present

## 2021-07-03 DIAGNOSIS — R0683 Snoring: Secondary | ICD-10-CM

## 2021-07-03 DIAGNOSIS — I447 Left bundle-branch block, unspecified: Secondary | ICD-10-CM | POA: Diagnosis not present

## 2021-07-03 DIAGNOSIS — Z0001 Encounter for general adult medical examination with abnormal findings: Secondary | ICD-10-CM

## 2021-07-03 DIAGNOSIS — R8290 Unspecified abnormal findings in urine: Secondary | ICD-10-CM

## 2021-07-03 DIAGNOSIS — R400 Somnolence: Secondary | ICD-10-CM

## 2021-07-03 DIAGNOSIS — R5382 Chronic fatigue, unspecified: Secondary | ICD-10-CM

## 2021-07-03 DIAGNOSIS — R6889 Other general symptoms and signs: Secondary | ICD-10-CM | POA: Diagnosis not present

## 2021-07-03 DIAGNOSIS — E559 Vitamin D deficiency, unspecified: Secondary | ICD-10-CM | POA: Diagnosis not present

## 2021-07-03 DIAGNOSIS — F322 Major depressive disorder, single episode, severe without psychotic features: Secondary | ICD-10-CM

## 2021-07-03 DIAGNOSIS — D582 Other hemoglobinopathies: Secondary | ICD-10-CM

## 2021-07-03 MED ORDER — BUSPIRONE HCL 15 MG PO TABS: ORAL_TABLET | ORAL | 3 refills | Status: DC

## 2021-07-03 MED ORDER — VENLAFAXINE HCL ER 37.5 MG PO CP24: 37.5000 mg | ORAL_CAPSULE | Freq: Every day | ORAL | 3 refills | Status: DC

## 2021-07-03 MED ORDER — LOSARTAN POTASSIUM 100 MG PO TABS: ORAL_TABLET | ORAL | 2 refills | Status: DC

## 2021-07-03 NOTE — Patient Instructions (Signed)
Major Depressive Disorder, Adult Major depressive disorder is a mental health condition. This disorder affects feelings. It can also affect the body. Symptoms of this condition last most of the day, almost every day, for 2 weeks. This disorder can affect: Relationships. Daily activities, such as work and school. Activities that you normally like to do. What are the causes? The cause of this condition is not known. The disorder is likely caused by a mix of things, including: Your personality, such as being a shy person. Your behavior, or how you act toward others. Your thoughts and feelings. Too much alcohol or drugs. How you react to stress. Health and mental problems that you have had for a long time. Things that hurt you in the past (trauma). Big changes in your life, such as divorce. What increases the risk? The following factors may make you more likely to develop this condition: Having family members with depression. Being a woman. Problems in the family. Low levels of some brain chemicals. Things that caused you pain as a child, especially if you lost a parent or were abused. A lot of stress in your life, such as from: Living without basic needs of life, such as food and shelter. Being treated poorly because of race, sex, or religion (discrimination). Health and mental problems that you have had for a long time. What are the signs or symptoms? The main symptoms of this condition are: Being sad all the time. Being grouchy all the time. Loss of interest in things and activities. Other symptoms include: Sleeping too much or too little. Eating too much or too little. Gaining or losing weight, without knowing why. Feeling tired or having low energy. Being restless and weak. Feeling hopeless, worthless, or guilty. Trouble thinking clearly or making decisions. Thoughts of hurting yourself or others, or thoughts of ending your life. Spending a lot of time alone. Inability to  complete common tasks of daily life. If you have very bad MDD, you may: Believe things that are not true. Hear, see, taste, or feel things that are not there. Have mild depression that lasts for at least 2 years. Feel very sad and hopeless. Have trouble speaking or moving. How is this treated? This condition may be treated with: Talk therapy. This teaches you to know bad thoughts, feelings, and actions and how to change them. This can also help you to communicate with others. This can be done with members of your family. Medicines. These can be used to treat worry (anxiety), depression, or low levels of chemicals in the brain. Lifestyle changes. You may need to: Limit alcohol use. Limit drug use. Get regular exercise. Get plenty of sleep. Make healthy eating choices. Spend more time outdoors. Brain stimulation. This treatment excites the brain. This is done when symptoms are very bad or have not gotten better with other treatments. Follow these instructions at home: Activity Get regular exercise as told. Spend time outdoors as told. Make time to do the things you enjoy. Find ways to deal with stress. Try to: Meditate. Do deep breathing. Spend time in nature. Keep a journal. Return to your normal activities as told by your doctor. Ask your doctor what activities are safe for you. Alcohol and drug use If you drink alcohol: Limit how much you use to: 0-1 drink a day for women. 0-2 drinks a day for men. Be aware of how much alcohol is in your drink. In the [place] one drink equals one 12 oz bottle of beer (355 mL),   one 5 oz glass of wine (148 mL), or one 1 oz glass of hard liquor (44 mL). Talk to your doctor about: Alcohol use. Alcohol can affect some medicines. Any drug use. General instructions  Take over-the-counter and prescription medicines and herbal preparations only as told by your doctor. Eat a healthy diet. Get a lot of sleep. Think about joining a support group.  Your doctor may be able to suggest one. Keep all follow-up visits as told by your doctor. This is important.  Where to find more information: National Alliance on Mental Illness: www.nami.org [place] National Institute of Mental Health: www.nimh.nih.gov American Psychiatric Association: www.psychiatry.org/patients-families/ Contact a doctor if: Your symptoms get worse. You get new symptoms. Get help right away if: You hurt yourself. You have serious thoughts about hurting yourself or others. You see, hear, taste, smell, or feel things that are not there. If you ever feel like you may hurt yourself or others, or have thoughts about taking your own life, get help right away. Go to your nearest emergency department or: Call your local emergency services (911 in the [place] Call a suicide crisis helpline, such as the National Suicide Prevention Lifeline at 1-800-273-8255. This is open 24 hours a day in the [place] Text the Crisis Text Line at 741741 (in the [place] Summary Major depressive disorder is a mental health condition. This disorder affects feelings. Symptoms of this condition last most of the day, almost every day, for 2 weeks. The symptoms of this disorder can cause problems with relationships and with daily activities. There are treatments and support for people who get this disorder. You may need more than one type of treatment. Get help right away if you have serious thoughts about hurting yourself or others. This information is not intended to replace advice given to you by your health care provider. Make sure you discuss any questions you have with your healthcare provider. Document Revised: 09/18/2019 Document Reviewed: 09/18/2019 Elsevier Patient Education  2022 [employer]  

## 2021-07-04 LAB — COMPLETE METABOLIC PANEL WITH GFR
AG Ratio: 1.8 (calc) (ref 1.0–2.5)
ALT: 24 U/L (ref 6–29)
AST: 19 U/L (ref 10–35)
Albumin: 4.6 g/dL (ref 3.6–5.1)
Alkaline phosphatase (APISO): 80 U/L (ref 37–153)
BUN: 14 mg/dL (ref 7–25)
CO2: 28 mmol/L (ref 20–32)
Calcium: 10.1 mg/dL (ref 8.6–10.4)
Chloride: 101 mmol/L (ref 98–110)
Creat: 0.71 mg/dL (ref 0.50–1.05)
Globulin: 2.6 g/dL (calc) (ref 1.9–3.7)
Glucose, Bld: 77 mg/dL (ref 65–99)
Potassium: 4.8 mmol/L (ref 3.5–5.3)
Sodium: 139 mmol/L (ref 135–146)
Total Bilirubin: 0.4 mg/dL (ref 0.2–1.2)
Total Protein: 7.2 g/dL (ref 6.1–8.1)
eGFR: 94 mL/min/{1.73_m2} (ref >=60)

## 2021-07-04 LAB — URINALYSIS, ROUTINE W REFLEX MICROSCOPIC
Bacteria, UA: NONE SEEN /HPF
Bilirubin Urine: NEGATIVE
Glucose, UA: NEGATIVE
Hgb urine dipstick: NEGATIVE
Hyaline Cast: NONE SEEN /LPF
Ketones, ur: NEGATIVE
Nitrite: NEGATIVE
Protein, ur: NEGATIVE
RBC / HPF: NONE SEEN /HPF (ref 0–2)
Specific Gravity, Urine: 1.007 (ref 1.001–1.035)
Squamous Epithelial / HPF: NONE SEEN /HPF (ref <=5)
pH: 5.5 (ref 5.0–8.0)

## 2021-07-04 LAB — CBC WITH DIFFERENTIAL/PLATELET
Absolute Monocytes: 585 cells/uL (ref 200–950)
Basophils Absolute: 54 cells/uL (ref 0–200)
Basophils Relative: 0.6 %
Eosinophils Absolute: 153 cells/uL (ref 15–500)
Eosinophils Relative: 1.7 %
HCT: 47.1 % — ABNORMAL HIGH (ref 35.0–45.0)
Hemoglobin: 15.8 g/dL — ABNORMAL HIGH (ref 11.7–15.5)
Lymphs Abs: 1836 cells/uL (ref 850–3900)
MCH: 30.4 pg (ref 27.0–33.0)
MCHC: 33.5 g/dL (ref 32.0–36.0)
MCV: 90.8 fL (ref 80.0–100.0)
MPV: 9.6 fL (ref 7.5–12.5)
Monocytes Relative: 6.5 %
Neutro Abs: 6372 cells/uL (ref 1500–7800)
Neutrophils Relative %: 70.8 %
Platelets: 329 10*3/uL (ref 140–400)
RBC: 5.19 10*6/uL — ABNORMAL HIGH (ref 3.80–5.10)
RDW: 12.2 % (ref 11.0–15.0)
Total Lymphocyte: 20.4 %
WBC: 9 10*3/uL (ref 3.8–10.8)

## 2021-07-04 LAB — URINE CULTURE
MICRO NUMBER:: 12367577
SPECIMEN QUALITY:: ADEQUATE

## 2021-07-04 LAB — PATHOLOGIST SMEAR REVIEW

## 2021-07-04 LAB — MICROSCOPIC MESSAGE

## 2021-07-04 NOTE — Addendum Note (Signed)
Addended by: Magda Bernheim on: 07/04/2021 08:55 AM   Modules accepted: Orders

## 2021-07-09 ENCOUNTER — Other Ambulatory Visit: Payer: Self-pay | Admitting: Nurse Practitioner

## 2021-07-09 DIAGNOSIS — D582 Other hemoglobinopathies: Secondary | ICD-10-CM

## 2021-07-10 ENCOUNTER — Other Ambulatory Visit: Payer: Self-pay | Admitting: *Deleted

## 2021-07-10 DIAGNOSIS — D751 Secondary polycythemia: Secondary | ICD-10-CM

## 2021-07-16 ENCOUNTER — Encounter: Payer: Self-pay | Admitting: Nurse Practitioner

## 2021-07-16 DIAGNOSIS — D582 Other hemoglobinopathies: Secondary | ICD-10-CM | POA: Insufficient documentation

## 2021-07-25 ENCOUNTER — Encounter: Payer: Self-pay | Admitting: Internal Medicine

## 2021-07-31 ENCOUNTER — Other Ambulatory Visit: Payer: BC Managed Care – PPO

## 2021-08-01 ENCOUNTER — Encounter: Payer: Self-pay | Admitting: Internal Medicine

## 2021-08-02 ENCOUNTER — Ambulatory Visit: Payer: BC Managed Care – PPO | Admitting: Nurse Practitioner

## 2021-08-03 ENCOUNTER — Other Ambulatory Visit: Payer: Self-pay

## 2021-08-03 ENCOUNTER — Inpatient Hospital Stay: Payer: Medicare Other | Attending: Oncology

## 2021-08-03 DIAGNOSIS — D751 Secondary polycythemia: Secondary | ICD-10-CM | POA: Insufficient documentation

## 2021-08-03 LAB — CBC WITH DIFFERENTIAL (CANCER CENTER ONLY)
Abs Immature Granulocytes: 0.02 10*3/uL (ref 0.00–0.07)
Basophils Absolute: 0 10*3/uL (ref 0.0–0.1)
Basophils Relative: 0 %
Eosinophils Absolute: 0.1 10*3/uL (ref 0.0–0.5)
Eosinophils Relative: 1 %
HCT: 42.9 % (ref 36.0–46.0)
Hemoglobin: 14.9 g/dL (ref 12.0–15.0)
Immature Granulocytes: 0 %
Lymphocytes Relative: 23 %
Lymphs Abs: 1.8 10*3/uL (ref 0.7–4.0)
MCH: 31 pg (ref 26.0–34.0)
MCHC: 34.7 g/dL (ref 30.0–36.0)
MCV: 89.2 fL (ref 80.0–100.0)
Monocytes Absolute: 0.7 10*3/uL (ref 0.1–1.0)
Monocytes Relative: 9 %
Neutro Abs: 5 10*3/uL (ref 1.7–7.7)
Neutrophils Relative %: 67 %
Platelet Count: 322 10*3/uL (ref 150–400)
RBC: 4.81 MIL/uL (ref 3.87–5.11)
RDW: 12 % (ref 11.5–15.5)
WBC Count: 7.6 10*3/uL (ref 4.0–10.5)
nRBC: 0 % (ref 0.0–0.2)

## 2021-08-03 LAB — SAVE SMEAR(SSMR), FOR PROVIDER SLIDE REVIEW

## 2021-08-06 ENCOUNTER — Telehealth: Payer: Self-pay | Admitting: *Deleted

## 2021-08-06 ENCOUNTER — Inpatient Hospital Stay: Payer: Medicare Other

## 2021-08-08 ENCOUNTER — Inpatient Hospital Stay: Payer: Medicare Other | Admitting: Nurse Practitioner

## 2021-08-10 ENCOUNTER — Encounter: Payer: Self-pay | Admitting: Gastroenterology

## 2021-10-03 ENCOUNTER — Institutional Professional Consult (permissible substitution): Payer: Self-pay | Admitting: Neurology

## 2021-10-05 ENCOUNTER — Ambulatory Visit: Payer: Medicare Other | Admitting: Nurse Practitioner

## 2021-11-09 ENCOUNTER — Ambulatory Visit: Payer: Medicare Other | Admitting: Nurse Practitioner

## 2021-12-17 ENCOUNTER — Encounter: Payer: Self-pay | Admitting: Neurology

## 2021-12-17 ENCOUNTER — Ambulatory Visit (INDEPENDENT_AMBULATORY_CARE_PROVIDER_SITE_OTHER): Payer: Medicare Other | Admitting: Neurology

## 2021-12-17 VITALS — BP 179/103 | HR 73 | Ht 64.5 in | Wt 176.0 lb

## 2021-12-17 DIAGNOSIS — D751 Secondary polycythemia: Secondary | ICD-10-CM

## 2021-12-17 DIAGNOSIS — R0683 Snoring: Secondary | ICD-10-CM

## 2021-12-17 DIAGNOSIS — R457 State of emotional shock and stress, unspecified: Secondary | ICD-10-CM

## 2021-12-17 DIAGNOSIS — F411 Generalized anxiety disorder: Secondary | ICD-10-CM | POA: Diagnosis not present

## 2021-12-17 DIAGNOSIS — R5383 Other fatigue: Secondary | ICD-10-CM

## 2021-12-17 DIAGNOSIS — I447 Left bundle-branch block, unspecified: Secondary | ICD-10-CM

## 2021-12-17 NOTE — Progress Notes (Addendum)
SLEEP MEDICINE CLINIC    Provider:  Larey Seat, MD  Primary Care Physician:  Unk Pinto, MD [address] [place] [address]     Referring Provider: Magda Bernheim, [place],  [place] [address]          Chief Complaint according to patient   Patient presents with:     New Patient (Initial Visit)           HISTORY OF PRESENT ILLNESS:  Maureen Henderson is a 66 y.o. White female patient who was seen on 12-17-2021 upon referral on 12/17/2021 from PCP office for a "rule-out" of sleep apnea .  Chief concern according to patient :  Presents today to evaluate OSA could be cause of some of the symptoms. Overall avg 7 hrs of sleep. She is taking 1-3 mg of melatonin and 1/2 tab of buspar. Never had a SS. 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 She feels depressed, she has struggled to lose weight and then regained it.  She was originally referred years ago but had not been seen as she rescheduled multiple times.    Maureen Henderson   has a past medical history of Adenomatous endometrial hyperplasia, Anxiety, Arthritis, Bundle, branch block, left, Cataract, Depression, Hyperlipidemia, Hypertension, Melanoma in situ  ([place]) (2012), and Osteopenia. The patient never had a previous sleep study.   Sleep relevant medical history:  Tonsillectomy yes - 16. reports neck pain, paraspinal tension. Wisdom teeth removed, small oral opening. Bruxism. Clenching. Erythrocytosis, according to referral notes.   Family medical /sleep history: No other family member on CPAP with OSA, insomnia, sleep walkers. mother has Alzheimer's dementia ( 94).    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  and lives in a household with alone. Family status is  married- husband stays in [place], she moved to take care of her  6 year -old mother. Has a 35 year old dog. Has one daughter , no biological  grandchildren.  The patient currently works in the field-  Tobacco use: never .  ETOH use ; nightly , only the last 2 months.  Caffeine intake in form of Coffee( decaffeinated) Soda( coke - 16 ounces) Tea ( /).she binges at night, snacks, and not sweets.  Savory.  Regular exercise in form of walking.   Hobbies : no time to read .     Sleep habits are as follows: The patient's dinner time is between 7-9 PM.  Continues to graze. The patient goes to bed at 10-12 PM and continues to sleep for 4-8 hours, wakes only for one bathroom break, the first time at 3-4 AM.   The preferred sleep position is sideways and prone, with the support of one pillow- Dreams are reportedly rare.  "I Just want to stay in bed all day ".  8-9 AM is the usual rise time. The patient wakes up spontaneously.  She reports not feeling refreshed or restored in AM, with symptoms such as stiffness, pain,  and residual fatigue.  Naps are never taken - "I can't stop and take a nap".    Review of Systems: Out of a complete 14 system review, the patient complains of only the following symptoms, and all other  reviewed systems are negative.:  Fatigue, sleepiness , snoring, fragmented sleep.   Mentally fatigued- " burn out"   Can't focus.  Can't stop running around, always active but not focussed.    How likely are you to doze in the following situations: 0 = not likely, 1 = slight chance, 2 = moderate chance, 3 = high chance   Sitting and Reading? Watching Television? Sitting inactive in a public place (theater or meeting)? As a passenger in a car for an hour without a break? Lying down in the afternoon when circumstances permit? Sitting and talking to someone? Sitting quietly after lunch without alcohol? In a car, while stopped for a few minutes in traffic?   Total = 4/  24 points   FSS endorsed at 48/ 63 points.  Geriatric depression score 11 out of 15, very high.   Social History   Socioeconomic History   Marital status: Married    Spouse name: Not on file   Number of children: Not on file   Years of education: Not on file   Highest education level: Not on file  Occupational History   Not on file  Tobacco Use   Smoking status: Never   Smokeless tobacco: Never  Vaping Use   Vaping Use: Never used  Substance and Sexual Activity   Alcohol use: Yes    Alcohol/week: 7.0 standard drinks    Types: 7 Glasses of wine per week   Drug use: No   Sexual activity: Not Currently  Other Topics Concern   Not on file  Social History Narrative   Not on file   Social Determinants of Health   Financial Resource Strain: Not on file  Food Insecurity: Not on file  Transportation Needs: Not on file  Physical Activity: Not on file  Stress: Not on file  Social Connections: Not on file    Family History  Problem Relation Age of Onset   Hypertension Mother    Cancer Father    Prostate cancer Father    Breast cancer Sister    Colon cancer Maternal Aunt    Colon polyps Sister    Cancer Maternal Grandmother    Heart disease Paternal Grandmother    Stroke Paternal Grandmother    Cancer Paternal Grandfather    Esophageal cancer Neg Hx    Stomach cancer Neg Hx    Rectal cancer Neg Hx     Past Medical History:  Diagnosis Date   Adenomatous endometrial hyperplasia    age 14 - hysterectomy    Anxiety    Arthritis    Bundle, branch block, left    Cataract    "beginnings of" per pt   Depression    Hyperlipidemia    no per pt- no meds taken   Hypertension    Melanoma ([facility] [place]) 2012   chest   Osteopenia     Past Surgical History:  Procedure Laterality Date   COLONOSCOPY     MELANOMA EXCISION     chest   PARTIAL HYSTERECTOMY  30   uterine polyps   POLYPECTOMY     TONSILLECTOMY       Current Outpatient Medications on File Prior to Visit   Medication Sig Dispense Refill   ASPIRIN 81 PO Take by mouth. 3 x a week     busPIRone (BUSPAR) 15 MG tablet Take 1/2-1 tab daily (Patient taking differently: 7.5 mg. Take 1/2) 30 tablet 3   CALCIUM CITRATE PO Take 1 tablet by mouth daily.  cholecalciferol (VITAMIN D) 1000 UNITS tablet Take 2,000 Units by mouth daily. 2000 units daily     estradiol (ESTRACE) 0.5 MG tablet TAKE 1/2 TABLET(0.5 MG) BY MOUTH THREE TIMES A WEEK 90 tablet 3   losartan (COZAAR) 100 MG tablet TAKE 1/2-1 TABLET(100 MG) BY MOUTH DAILY 90 tablet 2   Multiple Vitamin (MULTIVITAMIN) tablet Take 1 tablet by mouth daily.     venlafaxine XR (EFFEXOR XR) 37.5 MG 24 hr capsule Take 1 capsule (37.5 mg total) by mouth daily. 90 capsule 3   amphetamine-dextroamphetamine (ADDERALL) 10 MG tablet Take 1 tablet (10 mg total) by mouth 2 (two) times daily with a meal. (Patient taking differently: Take by mouth daily. 1/4 tablet daily) 60 tablet 0   No current facility-administered medications on file prior to visit.    Allergies  Allergen Reactions   Doxycycline Hives    Physical exam:  Today's Vitals   12/17/21 1305  BP: (!) 179/103  Pulse: 73  Weight: 176 lb (79.8 kg)  Height: 5' 4.5" (1.638 m)   Body mass index is 29.74 kg/m.   Wt Readings from Last 3 Encounters:  12/17/21 176 lb (79.8 kg)  07/03/21 164 lb 12.8 oz (74.8 kg)  06/05/21 161 lb (73 kg)     Ht Readings from Last 3 Encounters:  12/17/21 5' 4.5" (1.638 m)  06/05/21 5\' 4"  (1.626 m)  08/10/20 5\' 4"  (1.626 m)      General: The patient is awake, alert and appears not in acute distress. The patient is well groomed. Head: Normocephalic, atraumatic. Neck is supple.  Mallampati 2,  neck circumference:14.5  inches . Nasal airflow  patent.  Retrognathia is mild  seen.  Dental status: mouth guard for clenching.  Cardiovascular:  Regular rate and cardiac rhythm by pulse,  without distended neck veins. Respiratory: Lungs are clear to auscultation.  Skin:   Without evidence of ankle edema, or rash. Trunk: The patient's posture is erect.   Neurologic exam : The patient is awake and alert, oriented to place and time.   Memory subjective described as intact.  Attention span & concentration ability appears normal.  Speech is fluent,  without  dysarthria, dysphonia or aphasia.  Mood and affect are appropriate.   Cranial nerves: no loss of smell or taste reported  Pupils are equal and briskly reactive to light. Funduscopic exam deferred. .  Extraocular movements in vertical and horizontal planes were intact and without nystagmus. No Diplopia. Visual fields by finger perimetry are intact. Hearing was intact to soft voice and finger rubbing.  Tinnitus, bilateral - high pitched.   Facial sensation intact to fine touch.  Facial motor strength is symmetric and tongue and uvula move midline.  Neck ROM : rotation, tilt and flexion extension were normal for age and shoulder shrug was symmetrical.    Motor exam:  Symmetric bulk, tone and ROM.   Normal tone without cog-wheeling, symmetric grip strength .   Sensory:  Fine touch, pinprick and vibration were tested  and  normal.  Proprioception tested in the upper extremities was normal.   Coordination: Rapid alternating movements in the fingers/hands were of normal speed.  The Finger-to-nose maneuver was intact without evidence of ataxia, dysmetria or tremor.   Gait and station: Patient could rise unassisted from a seated position, walked without assistive device.  Stance is of normal width/ base and the patient turned with 3 steps ( RN report) .  Toe and heel walk were deferred.  Deep tendon reflexes: in  the upper and lower extremities are symmetric . Babinski response was deferred.       After spending a total time of  45  minutes face to face and additional time for physical and neurologic examination, review of laboratory studies,  personal review of imaging studies, reports and results of other  testing and review of referral information / records as far as provided in visit, I have established the following assessments:  High red Blood Cell count - unknown origin. Could this be hypoxia at night. ? OSA?   1) reports extreme fatigue but is not excessively sleepy.  2) reports being always worried, easily startled, sense of doom. Hypervigilance affecting sleep.  3) very high depression score endorsed. High caretaker burden.  4) fairly recently developed RLS- 4-5 years ago, some night with leg cramping. Reynaud's. Bilateral feet    neuropathy. Delayed sleep onset due to this.    My Plan is to proceed with:  1) we are meeting to rule out organic sleep disorders contributing to fatigue and difficulties sleeping.  2) I will order a home sleep test. : " I will never sleep in a lab"   I would like to thank Unk Pinto, MD and Magda Bernheim, [place],  [place] [address] for allowing me to meet with and to take care of this pleasant patient.   In short, Maureen Henderson is presenting with hypervigilance, depression, caregiver burden. There is no EDS , some nights she snores some nights RLS.  I plan to follow up either personally or through our NP within 2-4 months.   CC: I will share my notes with .  Electronically signed by: Larey Seat, MD 12/17/2021 1:15 PM  [facility] Sleep Board certified by The [employer] of Sleep Medicine and Diplomate of the [employer] of Sleep Medicine. Board certified In Neurology through the [place], Fellow of the [employer] of Neurology. Medical Director of [employer].

## 2021-12-17 NOTE — Patient Instructions (Signed)
Dementia Caregiver Guide °Dementia is a term used to describe a number of symptoms that affect memory and thinking. The most common symptoms include: °Memory loss. °Trouble with language and communication. °Trouble concentrating. °Poor judgment and problems with reasoning. °Wandering from home or public places. °Extreme anxiety or depression. °Being suspicious or having angry outbursts and accusations. °Child-like behavior and language. °Dementia can be frightening and confusing. And taking care of someone with dementia can be challenging. This guide provides tips to help you when providing care for a person with dementia. °How to help manage lifestyle changes °Dementia usually gets worse slowly over time. In the early stages, people with dementia can stay independent and safe with some help. In later stages, they need help with daily tasks such as dressing, grooming, and using the bathroom. There are actions you can take to help a person manage his or her life while living with this condition. °Communicating °When the person is talking or seems frustrated, make eye contact and hold the person's hand. °Ask specific questions that need yes or no answers. °Use simple words, short sentences, and a calm voice. Only give one direction at a time. °When offering choices, limit the person to just one or two. °Avoid correcting the person in a negative way. °If the person is struggling to find the right words, gently try to help him or her. °Preventing injury ° °Keep floors clear of clutter. Remove rugs, magazine racks, and floor lamps. °Keep hallways well lit, especially at night. °Put a handrail and nonslip mat in the bathtub or shower. °Put childproof locks on cabinets that contain dangerous items, such as medicines, alcohol, guns, toxic cleaning items, sharp tools or utensils, matches, and lighters. °For doors to the outside of the house, put the locks in places where the person cannot see or reach them easily. This will  help ensure that the person does not wander out of the house and get lost. °Be prepared for emergencies. Keep a list of emergency phone numbers and addresses in a convenient area. °Remove car keys and lock garage doors so that the person does not try to get in the car and drive. °Have the person wear a bracelet that tracks locations and identifies the person as having memory problems. This should be worn at all times for safety. °Helping with daily life ° °Keep the person on track with his or her routine. °Try to identify areas where the person may need help. °Be supportive, patient, calm, and encouraging. °Gently remind the person that adjusting to changes takes time. °Help with the tasks that the person has asked for help with. °Keep the person involved in daily tasks and decisions as much as possible. °Encourage conversation, but try not to get frustrated if the person struggles to find words or does not seem to appreciate your help. °How to recognize stress °Look for signs of stress in yourself and in the person you are caring for. If you notice signs of stress, take steps to manage it. Symptoms of stress include: °Feeling anxious, irritable, frustrated, or angry. °Denying that the person has dementia or that his or her symptoms will not improve. °Feeling depressed, hopeless, or unappreciated. °Difficulty sleeping. °Difficulty concentrating. °Developing stress-related health problems. °Feeling like you have too little time for your own life. °Follow these instructions at home: °Take care of your health °Make sure that you and the person you are caring for: °Get regular sleep. °Exercise regularly. °Eat regular, nutritious meals. °Take over-the-counter and prescription medicines only   as told by your health care providers. °Drink enough fluid to keep your urine pale yellow. °Attend all scheduled health care appointments. ° °General instructions °Join a support group with others who are caregivers. °Ask about  respite care resources. Respite care can provide short-term care for the person so that you can have a regular break from the stress of caregiving. °Consider any safety risks and take steps to avoid them. °Organize medicines in a pill box for each day of the week. °Create a plan to handle any legal or financial matters. Get legal or financial advice if needed. °Keep a calendar in a central location to remind the person of appointments or other activities. °Where to find support: °Many individuals and organizations offer support. These include: °Support groups for people with dementia. °Support groups for caregivers. °Counselors or therapists. °Home health care services. °Adult day care centers. °Where to find more information °Centers for Disease Control and Prevention: www.cdc.gov °Alzheimer's Association: www.alz.org °Family Caregiver Alliance: www.caregiver.org °Alzheimer's Foundation of [place]: www.alzfdn.org °Contact a health care provider if: °The person's health is rapidly getting worse. °You are no longer able to care for the person. °Caring for the person is affecting your physical and emotional health. °You are feeling depressed or anxious about caring for the person. °Get help right away if: °The person threatens himself or herself, you, or anyone else. °You feel depressed or sad, or feel that you want to harm yourself. °If you ever feel like your loved one may hurt himself or herself or others, or if he or she shares thoughts about taking his or her own life, get help right away. You can go to your nearest emergency department or: °Call your local emergency services (911 in the [place] °Call a suicide crisis helpline, such as the National Suicide Prevention Lifeline at 1-800-273-8255 or 988 in the [place] This is open 24 hours a day in the [place] °Text the Crisis Text Line at 741741 (in the [place] °Summary °Dementia is a term used to describe a number of symptoms that affect memory and thinking. °Dementia  usually gets worse slowly over time. °Take steps to reduce the person's risk of injury and to plan for future care. °Caregivers need support, relief from caregiving, and time for their own lives. °This information is not intended to replace advice given to you by your health care provider. Make sure you discuss any questions you have with your health care provider. °Document Revised: 05/02/2021 Document Reviewed: 02/21/2020 °Elsevier Patient Education © 2022 [employer] Inc. ° °

## 2021-12-26 ENCOUNTER — Telehealth: Payer: Self-pay

## 2021-12-26 NOTE — Progress Notes (Deleted)
?FOLLOW UP ? ?Assessment and Plan:  ? ?Hypertension ?Well controlled with current medications  ?Monitor blood pressure at home; patient to call if consistently greater than 130/80 ?Continue DASH diet.   ?Reminder to go to the ER if any CP, SOB, nausea, dizziness, severe HA, changes vision/speech, left arm numbness and tingling and jaw pain. ? ?Cholesterol ?Currently at goal;  ?Continue low cholesterol diet and exercise.  ?Check lipid panel.  ? ?Diabetes {with or without complications:30421263} ?Continue medication: ?Continue diet and exercise.  ?Perform daily foot/skin check, notify office of any concerning changes.  ?Check A1C ? ?Obesity with co morbidities ?Long discussion about weight loss, diet, and exercise ?Recommended diet heavy in fruits and veggies and low in animal meats, cheeses, and dairy products, appropriate calorie intake ?Discussed ideal weight for height (below ***) and initial weight goal (***) ?Patient will work on *** ?Will follow up in 3 months ? ?Vitamin D Def ?At goal at last visit; continue supplementation to maintain goal of 60-100 ?Defer Vit D level ? ?Continue diet and meds as discussed. Further disposition pending results of labs. Discussed med's effects and SE's.   ?Over 30 minutes of exam, counseling, chart review, and critical decision making was performed.  ? ?Future Appointments  ?Date Time Provider [place]  ?12/28/2021 11:00 AM Magda Bernheim, NP [facility] None  ?06/06/2022 10:00 AM Berton Butrick, Townsend Roger, NP [facility] None  ? ? ?---------------------------------------------------------------------------------------------------------------------- ? ?HPI ?66 y.o. female  presents for 3 month follow up on hypertension, cholesterol, diabetes, weight and vitamin D deficiency.  ? ?BMI is There is no height or weight on file to calculate BMI., she {HAS HAS UUV:25366} been working on diet and exercise. ?Wt Readings from Last 3 Encounters:  ?12/17/21 176 lb (79.8 kg)  ?07/03/21 164 lb 12.8  oz (74.8 kg)  ?06/05/21 161 lb (73 kg)  ? ? ?Her blood pressure {HAS HAS NOT:18834} been controlled at home, today their BP is   ? She {DOES_DOES YQI:34742} workout. She denies chest pain, shortness of breath, dizziness. ? ? She {ACTION; IS/IS VZD:63875643} on cholesterol medication {Cholesterol meds:21887} and denies myalgias. Her cholesterol {ACTION; IS/IS NOT:21021397} at goal. The cholesterol last visit was:   ?Lab Results  ?Component Value Date  ? CHOL 243 (H) 06/05/2021  ? HDL 81 06/05/2021  ? LDLCALC 135 (H) 06/05/2021  ? TRIG 148 06/05/2021  ? CHOLHDL 3.0 06/05/2021  ? ? She {Has/has not:18111} been working on diet and exercise for prediabetes, and denies {Symptoms; diabetes w/o none:19199}. Last A1C in the office was:  ?Lab Results  ?Component Value Date  ? HGBA1C 5.3 06/05/2021  ? ?Patient is on Vitamin D supplement.   ?Lab Results  ?Component Value Date  ? VD25OH 39 06/05/2021  ?   ? ? ? ?Current Medications:  ?Current Outpatient Medications on File Prior to Visit  ?Medication Sig  ? amphetamine-dextroamphetamine (ADDERALL) 10 MG tablet Take 1 tablet (10 mg total) by mouth 2 (two) times daily with a meal. (Patient taking differently: Take by mouth daily. 1/4 tablet daily)  ? ASPIRIN 81 PO Take by mouth. 3 x a week  ? busPIRone (BUSPAR) 15 MG tablet Take 1/2-1 tab daily (Patient taking differently: 7.5 mg. Take 1/2)  ? CALCIUM CITRATE PO Take 1 tablet by mouth daily.  ? cholecalciferol (VITAMIN D) 1000 UNITS tablet Take 2,000 Units by mouth daily. 2000 units daily  ? estradiol (ESTRACE) 0.5 MG tablet TAKE 1/2 TABLET(0.5 MG) BY MOUTH THREE TIMES A WEEK  ? losartan (COZAAR)  100 MG tablet TAKE 1/2-1 TABLET(100 MG) BY MOUTH DAILY  ? Multiple Vitamin (MULTIVITAMIN) tablet Take 1 tablet by mouth daily.  ? venlafaxine XR (EFFEXOR XR) 37.5 MG 24 hr capsule Take 1 capsule (37.5 mg total) by mouth daily.  ? ?No current facility-administered medications on file prior to visit.  ? ? ? ?Allergies:  ?Allergies   ?Allergen Reactions  ? Doxycycline Hives  ?  ? ?Medical History:  ?Past Medical History:  ?Diagnosis Date  ? Adenomatous endometrial hyperplasia   ? age 56 - hysterectomy   ? Anxiety   ? Arthritis   ? Bundle, branch block, left   ? Cataract   ? "beginnings of" per pt  ? Depression   ? Hyperlipidemia   ? no per pt- no meds taken  ? Hypertension   ? Melanoma ([place]) 2012  ? chest  ? Osteopenia   ? ?Family history- Reviewed and unchanged ?Social history- Reviewed and unchanged ? ? ?Review of Systems:  ?ROS ? ? ? ?Physical Exam: ?There were no vitals taken for this visit. ?Wt Readings from Last 3 Encounters:  ?12/17/21 176 lb (79.8 kg)  ?07/03/21 164 lb 12.8 oz (74.8 kg)  ?06/05/21 161 lb (73 kg)  ? ?General Appearance: Well nourished, in no apparent distress. ?Eyes: PERRLA, EOMs, conjunctiva no swelling or erythema ?Sinuses: No Frontal/maxillary tenderness ?ENT/Mouth: Ext aud canals clear, TMs without erythema, bulging. No erythema, swelling, or exudate on post pharynx.  Tonsils not swollen or erythematous. Hearing normal.  ?Neck: Supple, thyroid normal.  ?Respiratory: Respiratory effort normal, BS equal bilaterally without rales, rhonchi, wheezing or stridor.  ?Cardio: RRR with no MRGs. Brisk peripheral pulses without edema.  ?Abdomen: Soft, + BS.  Non tender, no guarding, rebound, hernias, masses. ?Lymphatics: Non tender without lymphadenopathy.  ?Musculoskeletal: Full ROM, 5/5 strength, {PSY - GAIT AND STATION:22860} gait ?Skin: Warm, dry without rashes, lesions, ecchymosis.  ?Neuro: Cranial nerves intact. No cerebellar symptoms.  ?Psych: Awake and oriented X 3, normal affect, Insight and Judgment appropriate.  ? ? ?Magda Bernheim, NP ?9:11 AM ?[facility] ? ?

## 2021-12-26 NOTE — Telephone Encounter (Signed)
LVM for pt to call me back to schedule sleep study  

## 2021-12-28 ENCOUNTER — Other Ambulatory Visit: Payer: Self-pay

## 2021-12-28 ENCOUNTER — Ambulatory Visit (INDEPENDENT_AMBULATORY_CARE_PROVIDER_SITE_OTHER): Payer: Medicare Other | Admitting: Adult Health

## 2021-12-28 ENCOUNTER — Encounter: Payer: Self-pay | Admitting: Adult Health

## 2021-12-28 ENCOUNTER — Ambulatory Visit: Payer: Medicare Other | Admitting: Nurse Practitioner

## 2021-12-28 VITALS — BP 156/102 | HR 83 | Temp 97.9°F | Wt 178.8 lb

## 2021-12-28 DIAGNOSIS — F411 Generalized anxiety disorder: Secondary | ICD-10-CM

## 2021-12-28 DIAGNOSIS — S39012A Strain of muscle, fascia and tendon of lower back, initial encounter: Secondary | ICD-10-CM

## 2021-12-28 DIAGNOSIS — E559 Vitamin D deficiency, unspecified: Secondary | ICD-10-CM

## 2021-12-28 DIAGNOSIS — F40298 Other specified phobia: Secondary | ICD-10-CM | POA: Insufficient documentation

## 2021-12-28 DIAGNOSIS — F900 Attention-deficit hyperactivity disorder, predominantly inattentive type: Secondary | ICD-10-CM

## 2021-12-28 DIAGNOSIS — R632 Polyphagia: Secondary | ICD-10-CM

## 2021-12-28 DIAGNOSIS — I10 Essential (primary) hypertension: Secondary | ICD-10-CM | POA: Diagnosis not present

## 2021-12-28 DIAGNOSIS — F322 Major depressive disorder, single episode, severe without psychotic features: Secondary | ICD-10-CM

## 2021-12-28 DIAGNOSIS — E782 Mixed hyperlipidemia: Secondary | ICD-10-CM

## 2021-12-28 DIAGNOSIS — Z6827 Body mass index (BMI) 27.0-27.9, adult: Secondary | ICD-10-CM

## 2021-12-28 MED ORDER — LISDEXAMFETAMINE DIMESYLATE 20 MG PO CAPS: 20.0000 mg | ORAL_CAPSULE | Freq: Every day | ORAL | 0 refills | Status: DC

## 2021-12-28 MED ORDER — BUSPIRONE HCL 15 MG PO TABS: ORAL_TABLET | ORAL | 3 refills | Status: DC

## 2021-12-28 MED ORDER — VENLAFAXINE HCL ER 75 MG PO CP24: 75.0000 mg | ORAL_CAPSULE | Freq: Every day | ORAL | 3 refills | Status: DC

## 2021-12-28 MED ORDER — TIZANIDINE HCL 2 MG PO TABS: 2.0000 mg | ORAL_TABLET | Freq: Three times a day (TID) | ORAL | 0 refills | Status: DC | PRN

## 2021-12-28 NOTE — Patient Instructions (Signed)
Lisdexamfetamine Capsule What is this medication? LISDEXAMFETAMINE (lis DEX am fet a meen) treats attention-deficit hyperactivity disorder (ADHD). It may also be used to treat binge eating disorder. It works by reducing hyperactivity and impulsive behaviors. It belongs to a group of medications called stimulants. This medicine may be used for other purposes; ask your health care provider or pharmacist if you have questions. COMMON BRAND NAME(S): Vyvanse What should I tell my care team before I take this medication? They need to know if you have any of these conditions: Anxiety or panic attacks Circulation problems in fingers and toes Glaucoma Hardening or blockages of the arteries or heart blood vessels Heart disease or a heart defect High blood pressure History of a drug or alcohol abuse problem History of stroke Kidney disease Liver disease Mental illness Seizures Suicidal thoughts, plans, or attempt; a previous suicide attempt by you or a family member Thyroid disease Tourette's syndrome An unusual or allergic reaction to lisdexamfetamine, other medications, foods, dyes, or preservatives Pregnant or trying to get pregnant Breast-feeding How should I use this medication? Take this medication by mouth. Follow the directions on the prescription label. Swallow the capsules with a drink of water. You may open capsule and add to a glass of water, then drink right away. Take your doses at regular intervals. Do not take your medication more often than directed. Do not suddenly stop your medication. You must gradually reduce the dose, or you may feel withdrawal effects. Ask your care team for advice. A special MedGuide will be given to you by the pharmacist with each prescription and refill. Be sure to read this information carefully each time. Talk to your care team regarding the use of this medication in children. While this medication may be prescribed for children as young as 36 years of age  for selected conditions, precautions do apply. Overdosage: If you think you have taken too much of this medicine contact a poison control center or emergency room at once. NOTE: This medicine is only for you. Do not share this medicine with others. What if I miss a dose? If you miss a dose, take it as soon as you can. If it is almost time for your next dose, take only that dose. Do not take double or extra doses. What may interact with this medication? Do not take this medication with any of the following: MAOIs like Carbex, Eldepryl, Marplan, Nardil, and Parnate Other stimulant medications for attention disorders, weight loss, or to stay awake This medication may also interact with the following: Acetazolamide Ammonium chloride Antacids Ascorbic acid Atomoxetine Caffeine Certain medications for blood pressure Certain medications for depression, anxiety, or psychotic disturbances Certain medications for seizures like carbamazepine, phenobarbital, phenytoin Certain medications for stomach problems like cimetidine, famotidine, omeprazole, lansoprazole Cold or allergy medications Green tea Levodopa Linezolid Medications for sleep during surgery Methenamine Norepinephrine Phenothiazines like chlorpromazine, mesoridazine, prochlorperazine, thioridazine Propoxyphene Sodium acid phosphate Sodium bicarbonate This list may not describe all possible interactions. Give your health care provider a list of all the medicines, herbs, non-prescription drugs, or dietary supplements you use. Also tell them if you smoke, drink alcohol, or use illegal drugs. Some items may interact with your medicine. What should I watch for while using this medication? Visit your care team for regular check ups. This prescription requires that you follow special procedures with your care team and pharmacy. You will need to have a new written prescription from your care team every time you need a refill. This  medication may affect your concentration, or hide signs of tiredness. Until you know how this medication affects you, do not drive, ride a bicycle, use machinery, or do anything that needs mental alertness. Tell your care team if this medication loses its effects, or if you feel you need to take more than the prescribed amount. Do not change your dose without talking to your care team. Decreased appetite is a common side effect when starting this medication. Eating small, frequent meals or snacks can help. Talk to your care team if you continue to have poor eating habits. Height and weight growth of a child taking this medication will be monitored closely. Do not take this medication close to bedtime. It may prevent you from sleeping. If you are going to need surgery, a MRI, CT scan, or other procedure, tell your care team that you are taking this medication. You may need to stop taking this medication before the procedure. Tell your care team right away if you notice unexplained wounds on your fingers and toes while taking this medication. You should also tell your care team if you experience numbness or pain, changes in the skin color, or sensitivity to temperature in your fingers or toes. What side effects may I notice from receiving this medication? Side effects that you should report to your care team as soon as possible: Allergic reactions--skin rash, itching, hives, swelling of the face, lips, tongue, or throat Heart rhythm changes--fast or irregular heartbeat, dizziness, feeling faint or lightheaded, chest pain, trouble breathing Increase in blood pressure Mood and behavior changes--anxiety, nervousness, confusion, hallucinations, irritability, hostility, thoughts of suicide or self-harm, worsening mood, feelings of depression Painful or prolonged erection Raynaud's--cool, numb, or painful fingers or toes that may change color from pale, to blue, to red Stroke in adults--sudden numbness or  weakness of the face, arm, or leg, trouble speaking, confusion, trouble walking, loss of balance or coordination, dizziness, severe headache, change in vision Side effects that usually do not require medical attention (report to your care team if they continue or are bothersome): Anxiety, nervousness Blurry vision Headache Loss of appetite Nausea Trouble sleeping Weight loss This list may not describe all possible side effects. Call your doctor for medical advice about side effects. You may report side effects to FDA at 1-800-FDA-1088. Where should I keep my medication? Keep out of the reach of children and pets. This medication can be abused. Keep your medication in a safe place to protect it from theft. Do not share this medication with anyone. Selling or giving away this medication is dangerous and against the law. Store at room temperature between 15 and 30 degrees C (59 and 86 degrees F). Protect from light. Keep container tightly closed. Throw away any unused medication after the expiration date. NOTE: This sheet is a summary. It may not cover all possible information. If you have questions about this medicine, talk to your doctor, pharmacist, or health care provider.  2022 Elsevier/Gold Standard (2020-11-23 00:00:00)     Tizanidine Capsules or Tablets What is this medication? TIZANIDINE (tye ZAN i deen) treats muscle spasms. It works by relaxing your muscles, which reduces muscle stiffness. It belongs to a group of medications called muscle relaxants. This medicine may be used for other purposes; ask your health care provider or pharmacist if you have questions. COMMON BRAND NAME(S): Zanaflex What should I tell my care team before I take this medication? They need to know if you have any of these conditions: Kidney disease Liver  disease Low blood pressure Mental health disease An unusual or allergic reaction to tizanidine, other medications, foods, dyes, or  preservatives Pregnant or trying to get pregnant Breast-feeding How should I use this medication? Take this medication by mouth with water. Take it as directed on the prescription label. You can take it with or without food. You should always take it the same way. Keep taking this medication unless your care team tells you to stop. Stopping it too quickly can cause serious side effects. Talk to your care team about the use of this medication in children. Special care may be needed. Patients over 28 years of age may have a stronger reaction and need a smaller dose. Overdosage: If you think you have taken too much of this medicine contact a poison control center or emergency room at once. NOTE: This medicine is only for you. Do not share this medicine with others. What if I miss a dose? If you miss a dose, take it as soon as you can. If it is almost time for your next dose, take only that dose. Do not take double or extra doses. What may interact with this medication? Do not take this medication with any of the following: Ciprofloxacin Fluvoxamine Narcotic medications for cough Thiabendazole Viloxazine This medication may also interact with the following: Acyclovir Alcohol Antihistamines for allergy, cough, and cold Baclofen Birth control pills Certain medications for anxiety or sleep Certain medications for blood pressure, heart disease, irregular heartbeat like amiodarone, mexiletine, propafenone, verapamil Certain medications for depression like amitriptyline, fluoxetine, sertraline Certain medications for seizures like phenobarbital, primidone Cimetidine Clonidine Famotidine General anesthetics like halothane, isoflurane, methoxyflurane, propofol Guanfacine Medications for sleep Medications that relax muscles for surgery Methyldopa Narcotic medications for pain Phenothiazines like chlorpromazine, prochlorperazine, thioridazine Ticlopidine Zileuton This list may not describe  all possible interactions. Give your health care provider a list of all the medicines, herbs, non-prescription drugs, or dietary supplements you use. Also tell them if you smoke, drink alcohol, or use illegal drugs. Some items may interact with your medicine. What should I watch for while using this medication? Visit your care team for regular checks on your progress. Tell your care team if your symptoms do not start to get better or if they get worse. You may get drowsy or dizzy. Do not drive, use machinery, or do anything that needs mental alertness until you know how this medication affects you. Do not stand up or sit up quickly, especially if you are an older patient. This reduces the risk of dizzy or fainting spells. Alcohol may interfere with the effects of this medication. Avoid alcoholic drinks. Your mouth may get dry. Chewing sugarless gum or sucking hard candy and drinking plenty of water may help. Contact your care team if the problem does not go away or is severe. What side effects may I notice from receiving this medication? Side effects that you should report to your care team as soon as possible: Allergic reactions--skin rash, itching, hives, swelling of the face, lips, tongue, or throat CNS depression--slow or shallow breathing, shortness of breath, feeling faint, dizziness, confusion, trouble staying awake Hallucinations Liver injury--right upper belly pain, loss of appetite, nausea, light-colored stool, dark yellow or brown urine, yellowing skin or eyes, unusual weakness or fatigue Low blood pressure--dizziness, feeling faint or lightheaded, blurry vision Side effects that usually do not require medical attention (report to your care team if they continue or are bothersome): Constipation Dizziness Drowsiness Dry mouth Fatigue This list may not  describe all possible side effects. Call your doctor for medical advice about side effects. You may report side effects to FDA at  1-800-FDA-1088. Where should I keep my medication? Keep out of the reach of children and pets. Store at room temperature between 15 and 30 degrees C (59 and 86 degrees F). Get rid of any unused medication after the expiration date. To get rid of medications that are no longer needed or have expired: Take the medication to a medication take-back program. Check with your pharmacy or law enforcement to find a location. If you cannot return the medication, check the label or package insert to see if the medication should be thrown out in the garbage or flushed down the toilet. If you are not sure, ask your care team. If it is safe to put it in the trash, take the medication out of the container. Mix the medication with cat litter, dirt, coffee grounds, or other unwanted substance. Seal the mixture in a bag or container. Put it in the trash. NOTE: This sheet is a summary. It may not cover all possible information. If you have questions about this medicine, talk to your doctor, pharmacist, or health care provider.  2022 Elsevier/Gold Standard (2021-02-23 00:00:00)

## 2021-12-28 NOTE — Progress Notes (Signed)
FOLLOW UP  Assessment and Plan:   Maureen Henderson was seen today for follow-up.  Diagnoses and all orders for this visit:  Essential hypertension Initially severely elevated, did improve some on recheck Suspect needle phobia/anxiety/white coat hypertension element On review very labile, intermittently well controlled readings in office 120s/60-70s Advised to start monitoring blood pressure at home; call if consistently over 130/80, bring home log with her to future visits Continue DASH diet.    Needle phobia (prefers minimal blood draws) Patient preference to defer blood draw today; last labs reviewed; no urgent follow up indicated at this time. Will defer all labs to CPE. See anxiety plan -   Anxiety state Ongoing, some improvement with effexor 37.5 mg, buspar does help but interested in reducing pill number if possible She is receptive to trying the next dose up on effexor, refilling buspar just in case but goal to titrate effexor to where she doesn't need this Continue to encourage therapy -  Stress reduction strategies  -     venlafaxine XR (EFFEXOR XR) 75 MG 24 hr capsule; Take 1 capsule (75 mg total) by mouth daily. -     busPIRone (BUSPAR) 15 MG tablet; Take 1/2-1 tab daily  Mixed hyperlipidemia Working on lifestyle modification Continue low cholesterol diet and exercise.  Defer lipid panel per strong patient preference  Attention deficit hyperactivity disorder (ADHD), predominantly inattentive type Try vyvanse for mood, focus and binge eating, contact office if any SE, follow up in 4 weeks.  -     lisdexamfetamine (VYVANSE) 20 MG capsule; Take 1 capsule (20 mg total) by mouth daily. For mood, focus and binge eating.  Current severe episode of major depressive disorder without psychotic features, unspecified whether recurrent (HCC) -     venlafaxine XR (EFFEXOR XR) 75 MG 24 hr capsule; Take 1 capsule (75 mg total) by mouth daily.  Binge eating -     lisdexamfetamine (VYVANSE) 20  MG capsule; Take 1 capsule (20 mg total) by mouth daily. For mood, focus and binge eating.  Back strain, initial encounter -     tiZANidine (ZANAFLEX) 2 MG tablet; Take 1 tablet (2 mg total) by mouth every 8 (eight) hours as needed for muscle spasms.  Needle phobia, hx of traumatic blood draw experience, requests minimal necessary needle sticks; reviewed together and will defer labs to CPE  Continue diet and meds as discussed. Further disposition pending results of labs. Discussed med's effects and SE's.   Over 30 minutes of exam, counseling, chart review, and critical decision making was performed.   Future Appointments  Date Time Provider [place]  06/06/2022 10:00 AM Magda Bernheim, NP [facility] None    ----------------------------------------------------------------------------------------------------------------------  HPI 66 y.o. female  presents for 3 month follow up on hypertension, cholesterol, mood, weight and vitamin D deficiency.   She presents appearing anxious, with elevated BP, seeing a new provider on short notice and also reports severe anxiety due to hx of traumatic blood draw, would like to decline today unless absolutely necessary.   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   Today she describes some evening binge eating patterns, is interested in discussing vyvanse. She wonders about the number of meds she is  on, would prefer to consolidate meds. Does have some anxiety about possible SE/reaction with higher doses, "I have been very sensitive in the past."   Also c/o 3-4 days of mid back paraspinal spasms and tightness, onset after she bent over to pull on compression  hose following vein injections. She reports has taken 2 advil and using topical voltaren and heat with some improvement, but would like to have a muscle relaxer over the weekend. Recalls tolerating flexeril in the past.   Endorsed non-restorative sleep, daytime fatigue, husband had noted snoring and she was referred to Dr. Brett Fairy to r/o OSA, pending home sleep study.   BMI is Body mass index is 30.22 kg/m., she admits to struggling with night time eating, some mild binge type behaviors, interested in vyvanse per above. Wt Readings from Last 3 Encounters:  12/28/21 178 lb 12.8 oz (81.1 kg)  12/17/21 176 lb (79.8 kg)  07/03/21 164 lb 12.8 oz (74.8 kg)   Initially very hypertensive - on review of historical, has been very labile  She is currently taking losartan 100 mg, reports she does check bps at home occasionally, has had BP cuff accuracy checked in office. Admits very anxious today about possible lab draw.   Today their BP is BP: (!) 156/102 on recheck by provider, initially 180/104.  She denies chest pain, shortness of breath, dizziness, HA.   She is not on cholesterol medication denies myalgias. Her cholesterol is not at goal. The cholesterol last visit was:   Lab Results  Component Value Date   CHOL 243 (H) 06/05/2021   HDL 81 06/05/2021   LDLCALC 135 (H) 06/05/2021   TRIG 148 06/05/2021   CHOLHDL 3.0 06/05/2021   Patient is on Vitamin D supplement.   Lab Results  Component Value Date   VD25OH 39 06/05/2021     Hx of elevated hgb, was referred to hematology, Dr. Betsy Coder ordered some and labs from 08/03/2021 showed normalized, OV  CBC Latest Ref Rng & Units 08/03/2021 07/03/2021 06/05/2021  WBC 4.0 - 10.5 K/uL 7.6 9.0 6.6  Hemoglobin 12.0 - 15.0 g/dL 14.9 15.8(H) 16.7(H)  Hematocrit 36.0 - 46.0 % 42.9 47.1(H) 49.9(H)  Platelets 150 - 400 K/uL 322 329 322     Current Medications:  Current Outpatient Medications on File Prior to Visit  Medication Sig    amphetamine-dextroamphetamine (ADDERALL) 10 MG tablet Take 1 tablet (10 mg total) by mouth 2 (two) times daily with a meal. (Patient taking differently: Take by mouth daily. 1/4 tablet daily)   ASPIRIN 81 PO Take by mouth. 3 x a week   CALCIUM CITRATE PO Take 1 tablet by mouth daily.   cholecalciferol (VITAMIN D) 1000 UNITS tablet Take 2,000 Units by mouth daily. 2000 units daily   estradiol (ESTRACE) 0.5 MG tablet TAKE 1/2 TABLET(0.5 MG) BY MOUTH THREE TIMES A WEEK   losartan (COZAAR) 100 MG tablet TAKE 1/2-1 TABLET(100 MG) BY MOUTH DAILY   Multiple Vitamin (MULTIVITAMIN) tablet Take 1 tablet by mouth daily.   No current facility-administered medications on file prior to visit.     Allergies:  Allergies  Allergen Reactions   Doxycycline Hives     Medical History:  Past Medical History:  Diagnosis Date   Adenomatous endometrial hyperplasia    age 37 - hysterectomy    Anxiety    Arthritis    Bundle, branch block, left    Cataract    "beginnings of" per pt   Depression    Hyperlipidemia  no per pt- no meds taken   Hypertension    Melanoma ([place]) 2012   chest   Osteopenia    Family history- Reviewed and unchanged Social history- Reviewed and unchanged   Review of Systems:  Review of Systems  Constitutional:  Negative for malaise/fatigue and weight loss.  HENT:  Negative for hearing loss and tinnitus.   Eyes:  Negative for blurred vision and double vision.  Respiratory:  Negative for cough, shortness of breath and wheezing.   Cardiovascular:  Negative for chest pain, palpitations, orthopnea, claudication and leg swelling.  Gastrointestinal:  Negative for abdominal pain, blood in stool, constipation, diarrhea, heartburn, melena, nausea and vomiting.  Genitourinary: Negative.   Musculoskeletal:  Positive for back pain (mid back pain, 3 days). Negative for joint pain and myalgias.  Skin:  Negative for rash.  Neurological:  Negative for dizziness, tingling, sensory change,  weakness and headaches.  Endo/Heme/Allergies:  Negative for polydipsia.  Psychiatric/Behavioral:  Positive for depression. Negative for memory loss, substance abuse and suicidal ideas. The patient is nervous/anxious. The patient does not have insomnia.   All other systems reviewed and are negative.    Physical Exam: BP (!) 156/102    Pulse 83    Temp 97.9 F (36.6 C)    Wt 178 lb 12.8 oz (81.1 kg)    SpO2 97%    BMI 30.22 kg/m  Wt Readings from Last 3 Encounters:  12/28/21 178 lb 12.8 oz (81.1 kg)  12/17/21 176 lb (79.8 kg)  07/03/21 164 lb 12.8 oz (74.8 kg)   General Appearance: Well nourished, in no apparent distress. Eyes: PERRLA, conjunctiva no swelling or erythema ENT/Mouth: mask in place; Hearing normal.  Neck: Supple, thyroid normal.  Respiratory: Respiratory effort normal, BS equal bilaterally without rales, rhonchi, wheezing or stridor.  Cardio: RRR with no MRGs. Brisk peripheral pulses without edema.  Lymphatics: Non tender without lymphadenopathy.  Musculoskeletal: no obvious deformity; normal non-antalgic gait. No mid line spinous tenderness, full ROM intact, mild muscle tenderness without spasm to mid back paraspinals.  Skin: Warm, dry without rashes, lesions, ecchymosis.  Neuro: Normal muscle tone Psych: Awake and oriented X 3, anxious affect, Insight and Judgment appropriate.   Izora Ribas, NP 12:19 PM [facility]

## 2022-01-02 ENCOUNTER — Other Ambulatory Visit: Payer: Self-pay | Admitting: Adult Health

## 2022-01-02 ENCOUNTER — Encounter: Payer: Self-pay | Admitting: Adult Health

## 2022-01-02 MED ORDER — METHYLPHENIDATE HCL 5 MG PO TABS: 2.5000 mg | ORAL_TABLET | Freq: Two times a day (BID) | ORAL | 0 refills | Status: DC | PRN

## 2022-01-02 MED ORDER — TOPIRAMATE 25 MG PO TABS: ORAL_TABLET | ORAL | 0 refills | Status: DC

## 2022-01-10 ENCOUNTER — Encounter: Payer: Self-pay | Admitting: Adult Health

## 2022-01-10 ENCOUNTER — Telehealth: Payer: Self-pay

## 2022-01-10 NOTE — Telephone Encounter (Signed)
Pt will call back to schedule her sleep study ?

## 2022-01-31 ENCOUNTER — Telehealth: Payer: Self-pay | Admitting: Adult Health

## 2022-01-31 ENCOUNTER — Other Ambulatory Visit: Payer: Self-pay | Admitting: Adult Health

## 2022-01-31 MED ORDER — METHYLPHENIDATE HCL 10 MG PO TABS: 5.0000 mg | ORAL_TABLET | Freq: Two times a day (BID) | ORAL | 0 refills | Status: DC | PRN

## 2022-01-31 NOTE — Telephone Encounter (Signed)
Prescription for Ritalin needs a PA wanting to know if Maureen Henderson can send in 10 mg instead of 5 mg because she pays out of pocket for it and she can cut them in half and not have to go to the pharmacy so often for refills. I told her do to it being a controlled substance I wasn't sure if we could but that the nurse would call her with an update.  ?

## 2022-01-31 NOTE — Telephone Encounter (Signed)
Spoke with the patient.  ?Please resend the prescription for Ritalin. Hasn't started it yet.  ?Waiting on the PA request from the pharmacy.  ? ?

## 2022-02-13 ENCOUNTER — Telehealth: Payer: Self-pay

## 2022-02-13 NOTE — Telephone Encounter (Signed)
[employer] has approved Mehtylphenidate '10mg'$  tablet from 01/02/22 until further notice.  ?

## 2022-02-13 NOTE — Telephone Encounter (Signed)
Returned pt's call and LVM for pt to call me back to schedule sleep study ° °

## 2022-02-27 ENCOUNTER — Other Ambulatory Visit: Payer: Self-pay | Admitting: Nurse Practitioner

## 2022-02-27 ENCOUNTER — Ambulatory Visit (INDEPENDENT_AMBULATORY_CARE_PROVIDER_SITE_OTHER): Payer: Medicare Other | Admitting: Neurology

## 2022-02-27 DIAGNOSIS — R457 State of emotional shock and stress, unspecified: Secondary | ICD-10-CM

## 2022-02-27 DIAGNOSIS — F411 Generalized anxiety disorder: Secondary | ICD-10-CM

## 2022-02-27 DIAGNOSIS — R0683 Snoring: Secondary | ICD-10-CM

## 2022-02-27 DIAGNOSIS — I10 Essential (primary) hypertension: Secondary | ICD-10-CM

## 2022-02-27 DIAGNOSIS — G4710 Hypersomnia, unspecified: Secondary | ICD-10-CM

## 2022-02-27 DIAGNOSIS — R5383 Other fatigue: Secondary | ICD-10-CM

## 2022-03-04 NOTE — Progress Notes (Signed)
? ? ? ?  ?  ?[facility] ?  ?HOME SLEEP TEST REPORT ( by Watch PAT)   ?STUDY DATE:  03-04-2022 ?  ?ORDERING CLINICIAN: Larey Seat, MD  ?REFERRING CLINICIAN: Marta Lamas, NP ?  ?CLINICAL INFORMATION/HISTORY: Maureen Henderson is a 66 y.o. female patient who was seen on 12-17-2021 upon referral  from PCP office . 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 She feels depressed, she has struggled to lose weight and then regained it. Present hypertensive today.  ?She was originally referred years ago but had rescheduled multiple times.  ?  ?Cyd Silence has a past medical history of Adenomatous endometrial hyperplasia, Anxiety, Arthritis, Bundle, branch block, left, Cataract, Depression, Erythrocytosis, according to referral notes, Hyperlipidemia, Hypertension, Melanoma in situ  ([place]) (2012), and Osteopenia.  ?Sleep relevant medical history:  On stimulant medication. Tonsillectomy yes - 16. reports neck pain, paraspinal tension pain. Wisdom teeth were removed, small oral opening. Bruxism/ Clenching.  ? ?  ?Epworth sleepiness score: 4 /24. ?  ?BMI: 29.4 kg/m? ?  ?Neck Circumference: 15" ?  ?FINDINGS: ?  ?Sleep Summary: ?  ?Total Recording Time (hours, min):     Total recording time amounted to 7 hours and 51 minutes of which 6 hours and 52 minutes with a total sleep time with a 19.44% REM sleep time. ? Respiratory Indices: ?  ?Calculated pAHI (per hour):       0.4/h                    ?  ?REM pAHI:   1.5/h                                            ?  ?NREM pAHI:    0.2/h                        ?  ?Positional AHI: The patient slept on her right side with an overall AHI of 0.5/h, ? ?Snoring data show a mean volume of 41 dB accompanying only 22% of total sleep  time                                                ?  ?Oxygen Saturation Statistics: ?  ?Oxygen Saturation (%) Mean:      95%       ?  ?  ?O2 Saturation Range (%): Between a nadir of 90 and a maximum of 99%                                   ?  ?O2 Saturation (minutes) <89%:   0 minutes      ?  ?Pulse Rate Statistics:              ?  ?Pulse Range:   Between 53 and 94 bpm.  Mean heart  rate was 70 bpm            ?  ?IMPRESSION:  This HST confirms that the patient does not suffer from sleep apnea, neither was a hypoxemia noted, there was a normal variability of heart rate seen and moderate snoring. ? ?The sleep graph shows a fairly and fragmented and interrupted sleep involving all stages of sleep.   ? ?RECOMMENDATION: There is no sleep disorder seen, fatigue or sleepiness are unrelated to a physiological sleep breathing disorder. ? ?  ?INTERPRETING PHYSICIAN: ? ? Larey Seat, MD  ? ?Medical Director of [facility] [employer].  ? ? ? ? ? ? ? ? ? ? ? ? ? ? ? ? ? ? ? ?

## 2022-03-06 NOTE — Procedures (Signed)
?  ?  ?[facility] ?  ?HOME SLEEP TEST REPORT ( by Watch PAT)   ?STUDY DATE:  03-04-2022 ?  ?ORDERING CLINICIAN: Larey Seat, MD  ?REFERRING CLINICIAN: Marta Lamas, NP ?  ?CLINICAL INFORMATION/HISTORY: Maureen Henderson is a 66 y.o. female patient who was seen on 12-17-2021 upon referral  from PCP office . 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 She feels depressed, she has struggled to lose weight and then regained it. Present hypertensive today.  ?She was originally referred years ago but had rescheduled multiple times.  ?  ?Maureen Henderson has a past medical history of Adenomatous endometrial hyperplasia, Anxiety, Arthritis, Bundle, branch block, left, Cataract, Depression, Erythrocytosis, according to referral notes, Hyperlipidemia, Hypertension, Melanoma in situ  ([place]) (2012), and Osteopenia.  ?Sleep relevant medical history:  On stimulant medication. Tonsillectomy yes - 16. reports neck pain, paraspinal tension pain. Wisdom teeth were removed, small oral opening. Bruxism/ Clenching.  ? ?  ?Epworth sleepiness score: 4 /24. ?  ?BMI: 29.4 kg/m? ?  ?Neck Circumference: 15" ?  ?FINDINGS: ?  ?Sleep Summary: ?  ?Total Recording Time (hours, min):     Total recording time amounted to 7 hours and 51 minutes of which 6 hours and 52 minutes with a total sleep time with a 19.44% REM sleep time. ? Respiratory Indices: ?  ?Calculated pAHI (per hour):       0.4/h                    ?  ?REM pAHI:   1.5/h                                            ?  ?NREM pAHI:    0.2/h                        ?  ?Positional AHI: The patient slept on her right side with an overall AHI of 0.5/h, ? ?Snoring data show a mean volume of 41 dB accompanying only 22% of total sleep time                                                 ?  ?Oxygen Saturation Statistics: ?  ?Oxygen Saturation (%) Mean:      95%       ?  ?  ?O2 Saturation Range (%): Between a nadir of 90 and a maximum of 99%                                   ?  ?O2 Saturation (minutes) <89%:   0 minutes      ?  ?Pulse Rate Statistics:              ?  ?Pulse Range:   Between 53 and 94 bpm.  Mean heart rate was 70  bpm            ?  ?IMPRESSION:  This HST confirms that the patient does not suffer from sleep apnea, neither was a hypoxemia noted, there was a normal variability of heart rate seen and moderate snoring. ? ?The sleep graph shows a fairly and fragmented and interrupted sleep involving all stages of sleep.   ? ?RECOMMENDATION: There is no sleep disorder seen, fatigue or sleepiness are unrelated to a physiological sleep breathing disorder. ? ?  ?INTERPRETING PHYSICIAN: ? ? Larey Seat, MD  ? ?Medical Director of [facility] [employer].  ? ? ? ? ? ? ? ? ? ? ? ? ? ? ? ? ? ? ? ?

## 2022-03-06 NOTE — Progress Notes (Signed)
IMPRESSION:  This HST confirms that the patient does not suffer from sleep apnea, neither was a hypoxemia noted, there was a normal variability of heart rate seen and moderate snoring. ? ?The sleep graph shows a fairly unfragmented and uninterrupted sleep architecture  involving all stages of sleep.   ?? ?RECOMMENDATION: There is no sleep disorder seen, fatigue or sleepiness are unrelated to a physiological sleep breathing disorder. ??

## 2022-03-07 ENCOUNTER — Encounter: Payer: Self-pay | Admitting: Neurology

## 2022-03-12 NOTE — Telephone Encounter (Signed)
Called patient to discuss sleep study results. No answer at this time. Left a detailed message advising her of the results and instructing her that a message with more detail was sent to her on mychart.  LVM for the patient to call back if she had questions or she can reply to [employer].

## 2022-04-11 ENCOUNTER — Other Ambulatory Visit: Payer: Self-pay | Admitting: Adult Health

## 2022-05-21 ENCOUNTER — Other Ambulatory Visit: Payer: Self-pay

## 2022-05-21 DIAGNOSIS — F411 Generalized anxiety disorder: Secondary | ICD-10-CM

## 2022-05-21 MED ORDER — BUSPIRONE HCL 15 MG PO TABS: ORAL_TABLET | ORAL | 3 refills | Status: DC

## 2022-06-06 ENCOUNTER — Encounter: Payer: Medicare Other | Admitting: Nurse Practitioner

## 2022-07-08 ENCOUNTER — Encounter: Payer: Self-pay | Admitting: Nurse Practitioner

## 2022-07-10 ENCOUNTER — Encounter: Payer: Medicare Other | Admitting: Nurse Practitioner

## 2022-08-06 NOTE — Progress Notes (Unsigned)
Complete Physical  Assessment and Plan: Maureen Henderson was seen today for annual exam.  Diagnoses and all orders for this visit:  Encounter for general adult medical examination with abnormal findings       - Due annually  Essential hypertension -     CBC with Differential/Platelet -     EKG 12-Lead - Advised she is to take Losartan 100 mg 1 tab daily- do not take 1/2 dose as BP is running high. Continue DASH diet, exercise and monitor at home. Call if greater than 130/80.    LBBB (left bundle branch block) -     EKG 12-Lead - Continue to monitor  Menopausal State       - Continue Estradiol 0.42m to 1/2 tab three times a week with eventual goal to d/c medication  Osteopenia, unspecified location       - DEXA 06/28/21 showed improvement of osteopenia  Anxiety state -    Continue Effexor and Buspar- anxiety is not controlled will give a small amount of Xanax that she is to use sparingly - Stressed importance of diet, exercise and therapy.    Malignant melanoma of torso excluding breast (HCC)       -  Continue to follow with dermatology, Dr GDelman Cheadle Mixed hyperlipidemia -     COMPLETE METABOLIC PANEL WITH GFR -     Lipid panel       - Stressed importance of diet and exercise   ADD Continue medication- Ritalin Practice diet, exercise and good sleep hygiene  Medication management -     CBC with Differential/Platelet -     COMPLETE METABOLIC PANEL WITH GFR -     Lipid panel -     TSH -     Hemoglobin A1c -     VITAMIN D 25 Hydroxy (Vit-D Deficiency, Fractures) -     Magnesium -     EKG 12-Lead -     Urinalysis, Routine w reflex microscopic -     Microalbumin / creatinine urine ratio  Vitamin D deficiency Continue Vit D supplementation to maintain value in therapeutic level of 60-100  -     VITAMIN D 25 Hydroxy (Vit-D Deficiency, Fractures)    Obesity BMI 30 Long discussion about weight loss, diet, and exercise Fair life protein shakes Eat more frequently - try not to go more  than 6 hours without protein Aim for 90 grams of protein a day- 30 breakfast/30 lunch 30 dinner Try to keep net carbs less than 50 Net Carbs=Total Carbs-fiber- sugar alcohols Exercise heartrate 120-140(fat burning zone)- walking 20-30 minutes 4 days a week  Follow up at next visit   Thyroid nodule - U/S of thyroid    Discussed med's effects and SE's. Screening labs and tests as requested with regular follow-up as recommended. Over 40 minutes of exam, counseling, chart review, and complex, high level critical decision making was performed this visit.   HPI  66y.o. female  presents for a complete physical and follow up for has Essential hypertension; Anxiety state; Melanoma (Maureen Henderson; Mixed hyperlipidemia; Osteopenia; LBBB (left bundle branch block); Attention deficit disorder (ADD); Elevated hemoglobin (Maureen Henderson; Caregiver stress syndrome; Snoring; Other fatigue; Erythrocytosis; and Needle phobia (prefers minimal blood draws) on their problem list..  Her mom has dementia, is a stressor for her, will go to visit frequently and stressed with this.  Had history of ADD and is taking medication irregularly.   She has chronic fatigue, seems like she is having more problems with  energy.She does take care of mother with dementia but she lives 90 minutes a way.  Someone does sit with her during the week. She is working to help with homeless.   She has more insomnia and anxiety. She continues to be very stressed and does not feel like her mood is controlled. She is on Ritalin for ADD has not been taking consistently. Buspar is not helping her anxiety, she is not doing well off of Alprazolam.    Her blood pressure has been controlled at home currently on Losartan 100 mg qd- she has been taking 1/2- 1 tab daily, today their BP is BP: (!) 158/92. White coat hypertension, anxiety with having labs drawn.  BP this morning 129/87. Encouraged to take 1 tab daily of Losartan. BP Readings from Last 3 Encounters:   08/07/22 (!) 158/92  12/28/21 (!) 156/102  12/17/21 (!) 179/103    She does not workout.. She denies chest pain, shortness of breath, dizziness.  LBBB X 2003,  Normal stress test at that time.   She is not on cholesterol medication and denies myalgias. Her cholesterol is at goal. She has been limiting her red meat, dairy.  Taking red yeast rice daily. The cholesterol last visit was:   Lab Results  Component Value Date   CHOL 243 (H) 06/05/2021   HDL 81 06/05/2021   LDLCALC 135 (H) 06/05/2021   TRIG 148 06/05/2021   CHOLHDL 3.0 06/05/2021   Last A1C in the office was:  Lab Results  Component Value Date   HGBA1C 5.3 06/05/2021   Last GFR: Lab Results  Component Value Date   EGFR 94 07/03/2021    Patient is on Vitamin D supplement 500 units daily Lab Results  Component Value Date   VD25OH 39 06/05/2021     BMI is Body mass index is 30.72 kg/m., she is interested in weight loss. She has been limiting her saturated fats and simple carbs. She is not getting much exercise.  Wt Readings from Last 3 Encounters:  08/07/22 181 lb 12.8 oz (82.5 kg)  12/28/21 178 lb 12.8 oz (81.1 kg)  12/17/21 176 lb (79.8 kg)    Current Medications:  Current Outpatient Medications on File Prior to Visit  Medication Sig Dispense Refill   busPIRone (BUSPAR) 15 MG tablet Take 1/2-1 tab daily 30 tablet 3   CALCIUM CITRATE PO Take 1 tablet by mouth daily.     cholecalciferol (VITAMIN D) 1000 UNITS tablet Take 2,000 Units by mouth daily. 2000 units daily     estradiol (ESTRACE) 0.5 MG tablet TAKE 1/2 TABLET(0.5 MG) BY MOUTH THREE TIMES A WEEK 90 tablet 3   losartan (COZAAR) 100 MG tablet TAKE 1 TABLET(100 MG) BY MOUTH DAILY 90 tablet 2   methylphenidate (RITALIN) 10 MG tablet Take 0.5-1 tablets (5-10 mg total) by mouth 2 (two) times daily as needed (Focus and mood). 60 tablet 0   Multiple Vitamin (MULTIVITAMIN) tablet Take 1 tablet by mouth daily.     tiZANidine (ZANAFLEX) 2 MG tablet Take 1 tablet  (2 mg total) by mouth every 8 (eight) hours as needed for muscle spasms. 30 tablet 0   venlafaxine XR (EFFEXOR XR) 75 MG 24 hr capsule Take 1 capsule (75 mg total) by mouth daily. 90 capsule 3   ASPIRIN 81 PO Take by mouth. 3 x a week     topiramate (TOPAMAX) 25 MG tablet TAKE 1/2 TO 1 TABLET BY MOUTH WITH THE EVENING MEAL FOR WEIGHT LOSS OR APPETITE (Patient  not taking: Reported on 08/07/2022) 90 tablet 0   No current facility-administered medications on file prior to visit.   Allergies:  Allergies  Allergen Reactions   Doxycycline Hives   Medical History:  She has Essential hypertension; Anxiety state; Melanoma ([place]); Mixed hyperlipidemia; Osteopenia; LBBB (left bundle branch block); Attention deficit disorder (ADD); Elevated hemoglobin ([place]); Caregiver stress syndrome; Snoring; Other fatigue; Erythrocytosis; and Needle phobia (prefers minimal blood draws) on their problem list. Health Maintenance:   Immunization History  Administered Date(s) Administered   Influenza Inj Mdck Quad With Preservative 09/18/2017, 09/03/2018   Influenza-Unspecified 08/05/2013, 09/18/2014, 11/17/2016   Janssen (J&J) SARS-COV-2 Vaccination 12/30/2019, 12/30/2019   Moderna SARS-COV2 Booster Vaccination 10/06/2020, 10/06/2020   PPD Test 09/30/2014   Pneumococcal Polysaccharide-23 06/05/2021   Td 08/30/2005   Zoster, Live 12/18/2016    Health Maintenance  Topic Date Due   Hepatitis C Screening  Never done   Zoster Vaccines- Shingrix (1 of 2) Never done   TETANUS/TDAP  10/21/2020   COVID-19 Vaccine (3 - Janssen risk series) 12/01/2020   COLONOSCOPY (Pts 45-24yr Insurance coverage will need to be confirmed)  08/10/2021   INFLUENZA VACCINE  05/21/2022   Pneumonia Vaccine 66 Years old (2 - PCV) 06/05/2022   MAMMOGRAM  04/06/2023   DEXA SCAN  Completed   HPV VACCINES  Aged Out     Patient Care Team: MUnk Pinto MD as PCP - General (Internal Medicine) CServando Salina MD as Consulting  Physician (Obstetrics and Gynecology) GAdrian Prows MD as Consulting Physician (Cardiology) BLafayette Dragon MD (Inactive) as Consulting Physician (Gastroenterology) GJari Pigg MD as Consulting Physician (Dermatology) BJacelyn Pi MD as Consulting Physician (Endocrinology)  Surgical History:  She has a past surgical history that includes Partial hysterectomy (30); Tonsillectomy; Melanoma excision; Colonoscopy; and Polypectomy. Family History:  Herfamily history includes Breast cancer in her sister; Cancer in her father, maternal grandmother, and paternal grandfather; Colon cancer in her maternal aunt; Colon polyps in her sister; Heart disease in her paternal grandmother; Hypertension in her mother; Prostate cancer in her father; Stroke in her paternal grandmother. Social History:  She reports that she has never smoked. She has never used smokeless tobacco. She reports current alcohol use of about 7.0 standard drinks of alcohol per week. She reports that she does not use drugs.  Review of Systems: Review of Systems  Constitutional:  Positive for malaise/fatigue. Negative for chills, diaphoresis, fever and weight loss.       Weight gain  HENT:  Positive for hearing loss and tinnitus. Negative for congestion, ear discharge, ear pain, nosebleeds, sinus pain and sore throat.   Eyes: Negative.  Negative for blurred vision and double vision.  Respiratory: Negative.  Negative for cough, shortness of breath and stridor.   Cardiovascular: Negative.  Negative for chest pain, palpitations, orthopnea and leg swelling.  Gastrointestinal:  Negative for abdominal pain, blood in stool, constipation, diarrhea, heartburn, melena, nausea and vomiting.  Genitourinary: Negative.   Musculoskeletal:  Negative for back pain, falls, joint pain, myalgias and neck pain.  Skin: Negative.  Negative for rash.  Neurological:  Negative for dizziness, tingling, tremors, loss of consciousness, weakness and headaches.   Endo/Heme/Allergies: Negative.   Psychiatric/Behavioral:  Negative for depression, memory loss and suicidal ideas. The patient is nervous/anxious and has insomnia.     Physical Exam: Estimated body mass index is 30.72 kg/m as calculated from the following:   Height as of this encounter: 5' 4.5" (1.638 m).   Weight as of this encounter: 181  lb 12.8 oz (82.5 kg). BP (!) 158/92   Pulse 83   Temp (!) 97.5 F (36.4 C)   Ht 5' 4.5" (1.638 m)   Wt 181 lb 12.8 oz (82.5 kg)   SpO2 96%   BMI 30.72 kg/m  General Appearance: Well nourished, in no apparent distress.  Eyes: PERRLA, EOMs, conjunctiva no swelling or erythema, normal fundi and vessels.  Sinuses: No Frontal/maxillary tenderness  ENT/Mouth: Ext aud canals clear, normal light reflex with TMs without erythema + bilateral effusions without erythema, pus, bulging of TM. Good dentition. No erythema, swelling, or exudate on post pharynx. Tonsils not swollen or erythematous. Hearing decreased Neck: Supple, nodule right thyroid Respiratory: Respiratory effort normal, BS equal bilaterally without rales, rhonchi, wheezing or stridor.  Cardio: RRR without murmurs, rubs or gallops. Brisk peripheral pulses without edema.  Chest: symmetric, with normal excursions and percussion.  Breasts: defer Abdomen: Soft, nontender, no guarding, rebound, hernias, masses, or organomegaly.  Lymphatics: Non tender without lymphadenopathy.  Genitourinary: defer Musculoskeletal: Full ROM all peripheral extremities,5/5 strength, and normal gait.  Skin: bilateral toes with some dryness, purplish color and no visible ulcers. Good cap refill.  Warm, dry without rashes, lesions, ecchymosis. Neuro: Cranial nerves intact, reflexes equal bilaterally. Normal muscle tone, no cerebellar symptoms. Sensation decreased Psych: Awake and oriented X 3, normal affect, Insight and Judgment appropriate.   EKG: NSR, LBBB   Kc Sedlak E  2:10 PM [facility]  [facility]

## 2022-08-07 ENCOUNTER — Other Ambulatory Visit: Payer: Self-pay | Admitting: Internal Medicine

## 2022-08-07 ENCOUNTER — Ambulatory Visit (INDEPENDENT_AMBULATORY_CARE_PROVIDER_SITE_OTHER): Payer: Medicare Other | Admitting: Nurse Practitioner

## 2022-08-07 ENCOUNTER — Encounter: Payer: Self-pay | Admitting: Nurse Practitioner

## 2022-08-07 VITALS — BP 158/92 | HR 83 | Temp 97.5°F | Ht 64.5 in | Wt 181.8 lb

## 2022-08-07 DIAGNOSIS — C4359 Malignant melanoma of other part of trunk: Secondary | ICD-10-CM | POA: Diagnosis not present

## 2022-08-07 DIAGNOSIS — I447 Left bundle-branch block, unspecified: Secondary | ICD-10-CM

## 2022-08-07 DIAGNOSIS — Z6827 Body mass index (BMI) 27.0-27.9, adult: Secondary | ICD-10-CM

## 2022-08-07 DIAGNOSIS — E669 Obesity, unspecified: Secondary | ICD-10-CM

## 2022-08-07 DIAGNOSIS — Z79899 Other long term (current) drug therapy: Secondary | ICD-10-CM

## 2022-08-07 DIAGNOSIS — N951 Menopausal and female climacteric states: Secondary | ICD-10-CM | POA: Diagnosis not present

## 2022-08-07 DIAGNOSIS — I10 Essential (primary) hypertension: Secondary | ICD-10-CM

## 2022-08-07 DIAGNOSIS — F411 Generalized anxiety disorder: Secondary | ICD-10-CM

## 2022-08-07 DIAGNOSIS — E782 Mixed hyperlipidemia: Secondary | ICD-10-CM

## 2022-08-07 DIAGNOSIS — F900 Attention-deficit hyperactivity disorder, predominantly inattentive type: Secondary | ICD-10-CM

## 2022-08-07 DIAGNOSIS — M8580 Other specified disorders of bone density and structure, unspecified site: Secondary | ICD-10-CM

## 2022-08-07 DIAGNOSIS — E041 Nontoxic single thyroid nodule: Secondary | ICD-10-CM

## 2022-08-07 DIAGNOSIS — E559 Vitamin D deficiency, unspecified: Secondary | ICD-10-CM

## 2022-08-07 DIAGNOSIS — Z1231 Encounter for screening mammogram for malignant neoplasm of breast: Secondary | ICD-10-CM

## 2022-08-07 DIAGNOSIS — Z0001 Encounter for general adult medical examination with abnormal findings: Secondary | ICD-10-CM

## 2022-08-07 MED ORDER — ALPRAZOLAM 0.5 MG PO TABS: 0.5000 mg | ORAL_TABLET | Freq: Three times a day (TID) | ORAL | 0 refills | Status: DC | PRN

## 2022-08-07 NOTE — Patient Instructions (Signed)
YOU CAN CALL TO MAKE AN ULTRASOUND..  I have put in an order for an ultrasound for you to have You can set them up at your convenience by calling this number [phone] You will likely have the ultrasound at [place] [address]  If you have any issues call our office and we will set this up for you.    Fair life protein shakes Eat more frequently - try not to go more than 6 hours without protein Aim for 90 grams of protein a day- 30 breakfast/30 lunch 30 dinner Try to keep net carbs less than 50 Net Carbs=Total Carbs-fiber- sugar alcohols Exercise heartrate 120-140(fat burning zone)- walking 20-30 minutes 4 days a week

## 2022-08-08 ENCOUNTER — Other Ambulatory Visit: Payer: Self-pay | Admitting: Nurse Practitioner

## 2022-08-08 DIAGNOSIS — E871 Hypo-osmolality and hyponatremia: Secondary | ICD-10-CM

## 2022-08-08 LAB — URINALYSIS, ROUTINE W REFLEX MICROSCOPIC
Bilirubin Urine: NEGATIVE
Glucose, UA: NEGATIVE
Hgb urine dipstick: NEGATIVE
Ketones, ur: NEGATIVE
Leukocytes,Ua: NEGATIVE
Nitrite: NEGATIVE
Protein, ur: NEGATIVE
Specific Gravity, Urine: 1.004 (ref 1.001–1.035)
pH: 6 (ref 5.0–8.0)

## 2022-08-08 LAB — COMPLETE METABOLIC PANEL WITH GFR
AG Ratio: 1.8 (calc) (ref 1.0–2.5)
ALT: 24 U/L (ref 6–29)
AST: 17 U/L (ref 10–35)
Albumin: 4.3 g/dL (ref 3.6–5.1)
Alkaline phosphatase (APISO): 78 U/L (ref 37–153)
BUN: 17 mg/dL (ref 7–25)
CO2: 25 mmol/L (ref 20–32)
Calcium: 9.6 mg/dL (ref 8.6–10.4)
Chloride: 97 mmol/L — ABNORMAL LOW (ref 98–110)
Creat: 0.75 mg/dL (ref 0.50–1.05)
Globulin: 2.4 g/dL (calc) (ref 1.9–3.7)
Glucose, Bld: 90 mg/dL (ref 65–99)
Potassium: 4.2 mmol/L (ref 3.5–5.3)
Sodium: 133 mmol/L — ABNORMAL LOW (ref 135–146)
Total Bilirubin: 0.4 mg/dL (ref 0.2–1.2)
Total Protein: 6.7 g/dL (ref 6.1–8.1)
eGFR: 88 mL/min/{1.73_m2} (ref >=60)

## 2022-08-08 LAB — MICROALBUMIN / CREATININE URINE RATIO
Creatinine, Urine: 22 mg/dL (ref 20–275)
Microalb Creat Ratio: 9 mcg/mg creat (ref <30)
Microalb, Ur: 0.2 mg/dL

## 2022-08-08 LAB — LIPID PANEL
Cholesterol: 198 mg/dL (ref <200)
HDL: 86 mg/dL (ref >=50)
LDL Cholesterol (Calc): 88 mg/dL (calc)
Non-HDL Cholesterol (Calc): 112 mg/dL (calc) (ref <130)
Total CHOL/HDL Ratio: 2.3 (calc) (ref <5.0)
Triglycerides: 140 mg/dL (ref <150)

## 2022-08-08 LAB — CBC WITH DIFFERENTIAL/PLATELET
Absolute Monocytes: 686 cells/uL (ref 200–950)
Basophils Absolute: 53 cells/uL (ref 0–200)
Basophils Relative: 0.6 %
Eosinophils Absolute: 106 cells/uL (ref 15–500)
Eosinophils Relative: 1.2 %
HCT: 46.7 % — ABNORMAL HIGH (ref 35.0–45.0)
Hemoglobin: 15.9 g/dL — ABNORMAL HIGH (ref 11.7–15.5)
Lymphs Abs: 1654 cells/uL (ref 850–3900)
MCH: 31.2 pg (ref 27.0–33.0)
MCHC: 34 g/dL (ref 32.0–36.0)
MCV: 91.6 fL (ref 80.0–100.0)
MPV: 9.5 fL (ref 7.5–12.5)
Monocytes Relative: 7.8 %
Neutro Abs: 6301 cells/uL (ref 1500–7800)
Neutrophils Relative %: 71.6 %
Platelets: 345 10*3/uL (ref 140–400)
RBC: 5.1 10*6/uL (ref 3.80–5.10)
RDW: 11.9 % (ref 11.0–15.0)
Total Lymphocyte: 18.8 %
WBC: 8.8 10*3/uL (ref 3.8–10.8)

## 2022-08-08 LAB — VITAMIN D 25 HYDROXY (VIT D DEFICIENCY, FRACTURES): Vit D, 25-Hydroxy: 38 ng/mL (ref 30–100)

## 2022-08-08 LAB — HEMOGLOBIN A1C
Hgb A1c MFr Bld: 5.4 % of total Hgb (ref <5.7)
Mean Plasma Glucose: 108 mg/dL
eAG (mmol/L): 6 mmol/L

## 2022-08-08 LAB — MAGNESIUM: Magnesium: 2.2 mg/dL (ref 1.5–2.5)

## 2022-08-08 LAB — TSH: TSH: 0.79 mIU/L (ref 0.40–4.50)

## 2022-08-09 ENCOUNTER — Ambulatory Visit
Admission: RE | Admit: 2022-08-09 | Discharge: 2022-08-09 | Disposition: A | Discharge status: Home / Self Care | Payer: Medicare Other | Source: Ambulatory Visit | Attending: Nurse Practitioner | Admitting: Nurse Practitioner

## 2022-08-09 DIAGNOSIS — E041 Nontoxic single thyroid nodule: Secondary | ICD-10-CM

## 2022-08-11 ENCOUNTER — Encounter: Payer: Self-pay | Admitting: Nurse Practitioner

## 2022-08-12 ENCOUNTER — Other Ambulatory Visit: Payer: BLUE CROSS/BLUE SHIELD

## 2022-08-20 ENCOUNTER — Other Ambulatory Visit: Payer: Self-pay | Admitting: Nurse Practitioner

## 2022-08-20 DIAGNOSIS — Z79890 Hormone replacement therapy (postmenopausal): Secondary | ICD-10-CM

## 2022-08-20 DIAGNOSIS — F411 Generalized anxiety disorder: Secondary | ICD-10-CM

## 2022-08-20 DIAGNOSIS — I10 Essential (primary) hypertension: Secondary | ICD-10-CM

## 2022-08-22 ENCOUNTER — Ambulatory Visit (INDEPENDENT_AMBULATORY_CARE_PROVIDER_SITE_OTHER): Payer: Medicare Other

## 2022-08-22 DIAGNOSIS — E871 Hypo-osmolality and hyponatremia: Secondary | ICD-10-CM | POA: Diagnosis not present

## 2022-08-22 NOTE — Progress Notes (Signed)
Patient here to recheck her sodium level.

## 2022-08-23 LAB — BASIC METABOLIC PANEL WITH GFR
BUN: 11 mg/dL (ref 7–25)
CO2: 25 mmol/L (ref 20–32)
Calcium: 10 mg/dL (ref 8.6–10.4)
Chloride: 100 mmol/L (ref 98–110)
Creat: 0.76 mg/dL (ref 0.50–1.05)
Glucose, Bld: 126 mg/dL — ABNORMAL HIGH (ref 65–99)
Potassium: 4.4 mmol/L (ref 3.5–5.3)
Sodium: 137 mmol/L (ref 135–146)
eGFR: 86 mL/min/{1.73_m2} (ref >=60)

## 2022-09-11 ENCOUNTER — Encounter: Payer: Self-pay | Admitting: Internal Medicine

## 2022-09-11 LAB — HM MAMMOGRAPHY

## 2022-09-26 ENCOUNTER — Ambulatory Visit: Payer: BLUE CROSS/BLUE SHIELD

## 2022-10-01 ENCOUNTER — Encounter: Payer: Self-pay | Admitting: Internal Medicine

## 2022-10-03 ENCOUNTER — Encounter: Payer: Self-pay | Admitting: Internal Medicine

## 2022-10-04 ENCOUNTER — Encounter: Payer: Self-pay | Admitting: Nurse Practitioner

## 2022-10-10 ENCOUNTER — Other Ambulatory Visit: Payer: Self-pay | Admitting: Internal Medicine

## 2022-10-10 ENCOUNTER — Ambulatory Visit (INDEPENDENT_AMBULATORY_CARE_PROVIDER_SITE_OTHER): Payer: Medicare Other | Admitting: Internal Medicine

## 2022-10-10 ENCOUNTER — Encounter: Payer: Self-pay | Admitting: Internal Medicine

## 2022-10-10 VITALS — BP 160/98 | HR 84 | Temp 97.9°F | Resp 16 | Ht 64.5 in | Wt 181.8 lb

## 2022-10-10 DIAGNOSIS — I10 Essential (primary) hypertension: Secondary | ICD-10-CM | POA: Diagnosis not present

## 2022-10-10 DIAGNOSIS — F900 Attention-deficit hyperactivity disorder, predominantly inattentive type: Secondary | ICD-10-CM | POA: Diagnosis not present

## 2022-10-10 MED ORDER — BISOPROLOL FUMARATE 10 MG PO TABS: ORAL_TABLET | ORAL | 3 refills | Status: DC

## 2022-10-10 MED ORDER — AMPHETAMINE-DEXTROAMPHETAMINE 5 MG PO TABS: ORAL_TABLET | ORAL | 0 refills | Status: DC

## 2022-10-10 MED ORDER — OLMESARTAN MEDOXOMIL 40 MG PO TABS: ORAL_TABLET | ORAL | 3 refills | Status: DC

## 2022-10-10 NOTE — Patient Instructions (Signed)
Hypertension, Adult High blood pressure (hypertension) is when the force of blood pumping through the arteries is too strong. The arteries are the blood vessels that carry blood from the heart throughout the body. Hypertension forces the heart to work harder to pump blood and may cause arteries to become narrow or stiff. Untreated or uncontrolled hypertension can lead to a heart attack, heart failure, a stroke, kidney disease, and other problems. A blood pressure reading consists of a higher number over a lower number. Ideally, your blood pressure should be below 120/80. The first ("top") number is called the systolic pressure. It is a measure of the pressure in your arteries as your heart beats. The second ("bottom") number is called the diastolic pressure. It is a measure of the pressure in your arteries as the heart relaxes. What are the causes? The exact cause of this condition is not known. There are some conditions that result in high blood pressure. What increases the risk? Certain factors may make you more likely to develop high blood pressure. Some of these risk factors are under your control, including: Smoking. Not getting enough exercise or physical activity. Being overweight. Having too much fat, sugar, calories, or salt (sodium) in your diet. Drinking too much alcohol. Other risk factors include: Having a personal history of heart disease, diabetes, high cholesterol, or kidney disease. Stress. Having a family history of high blood pressure and high cholesterol. Having obstructive sleep apnea. Age. The risk increases with age. What are the signs or symptoms? High blood pressure may not cause symptoms. Very high blood pressure (hypertensive crisis) may cause: Headache. Fast or irregular heartbeats (palpitations). Shortness of breath. Nosebleed. Nausea and vomiting. Vision changes. Severe chest pain, dizziness, and seizures. How is this diagnosed? This condition is diagnosed by  measuring your blood pressure while you are seated, with your arm resting on a flat surface, your legs uncrossed, and your feet flat on the floor. The cuff of the blood pressure monitor will be placed directly against the skin of your upper arm at the level of your heart. Blood pressure should be measured at least twice using the same arm. Certain conditions can cause a difference in blood pressure between your right and left arms. If you have a high blood pressure reading during one visit or you have normal blood pressure with other risk factors, you may be asked to: Return on a different day to have your blood pressure checked again. Monitor your blood pressure at home for 1 week or longer. If you are diagnosed with hypertension, you may have other blood or imaging tests to help your health care provider understand your overall risk for other conditions. How is this treated? This condition is treated by making healthy lifestyle changes, such as eating healthy foods, exercising more, and reducing your alcohol intake. You may be referred for counseling on a healthy diet and physical activity. Your health care provider may prescribe medicine if lifestyle changes are not enough to get your blood pressure under control and if: Your systolic blood pressure is above 130. Your diastolic blood pressure is above 80. Your personal target blood pressure may vary depending on your medical conditions, your age, and other factors. Follow these instructions at home: Eating and drinking  Eat a diet that is high in fiber and potassium, and low in sodium, added sugar, and fat. An example of this eating plan is called the DASH diet. DASH stands for Dietary Approaches to Stop Hypertension. To eat this way: Eat   plenty of fresh fruits and vegetables. Try to fill one half of your plate at each meal with fruits and vegetables. Eat whole grains, such as whole-wheat pasta, brown rice, or whole-grain bread. Fill about one  fourth of your plate with whole grains. Eat or drink low-fat dairy products, such as skim milk or low-fat yogurt. Avoid fatty cuts of meat, processed or cured meats, and poultry with skin. Fill about one fourth of your plate with lean proteins, such as fish, chicken without skin, beans, eggs, or tofu. Avoid pre-made and processed foods. These tend to be higher in sodium, added sugar, and fat. Reduce your daily sodium intake. Many people with hypertension should eat less than 1,500 mg of sodium a day. Do not drink alcohol if: Your health care provider tells you not to drink. You are pregnant, may be pregnant, or are planning to become pregnant. If you drink alcohol: Limit how much you have to: 0-1 drink a day for women. 0-2 drinks a day for men. Know how much alcohol is in your drink. In the [place] one drink equals one 12 oz bottle of beer (355 mL), one 5 oz glass of wine (148 mL), or one 1 oz glass of hard liquor (44 mL). Lifestyle  Work with your health care provider to maintain a healthy body weight or to lose weight. Ask what an ideal weight is for you. Get at least 30 minutes of exercise that causes your heart to beat faster (aerobic exercise) most days of the week. Activities may include walking, swimming, or biking. Include exercise to strengthen your muscles (resistance exercise), such as Pilates or lifting weights, as part of your weekly exercise routine. Try to do these types of exercises for 30 minutes at least 3 days a week. Do not use any products that contain nicotine or tobacco. These products include cigarettes, chewing tobacco, and vaping devices, such as e-cigarettes. If you need help quitting, ask your health care provider. Monitor your blood pressure at home as told by your health care provider. Keep all follow-up visits. This is important. Medicines Take over-the-counter and prescription medicines only as told by your health care provider. Follow directions carefully. Blood  pressure medicines must be taken as prescribed. Do not skip doses of blood pressure medicine. Doing this puts you at risk for problems and can make the medicine less effective. Ask your health care provider about side effects or reactions to medicines that you should watch for. Contact a health care provider if you: Think you are having a reaction to a medicine you are taking. Have headaches that keep coming back (recurring). Feel dizzy. Have swelling in your ankles. Have trouble with your vision. Get help right away if you: Develop a severe headache or confusion. Have unusual weakness or numbness. Feel faint. Have severe pain in your chest or abdomen. Vomit repeatedly. Have trouble breathing. These symptoms may be an emergency. Get help right away. Call 911. Do not wait to see if the symptoms will go away. Do not drive yourself to the hospital. Summary Hypertension is when the force of blood pumping through your arteries is too strong. If this condition is not controlled, it may put you at risk for serious complications. Your personal target blood pressure may vary depending on your medical conditions, your age, and other factors. For most people, a normal blood pressure is less than 120/80. Hypertension is treated with lifestyle changes, medicines, or a combination of both. Lifestyle changes include losing weight, eating a healthy,   low-sodium diet, exercising more, and limiting alcohol. This information is not intended to replace advice given to you by your health care provider. Make sure you discuss any questions you have with your health care provider. Document Revised: 08/14/2021 Document Reviewed: 08/14/2021 Elsevier Patient Education  2023 [employer] Inc.  

## 2022-10-10 NOTE — Progress Notes (Signed)
Future Appointments  Date Time Provider Department  02/11/2023  2:30 PM Alycia Rossetti, NP [facility]  08/11/2023  2:00 PM Alycia Rossetti, NP [facility]    History of Present Illness:       This very nice 66 y.o.WWF with  HTN, HLD, Pre-Diabetes, ADD and Vitamin D Deficiency presents with concern of multiple  recent elevated BP's up to 182/99.        Patient is treated for HTN circa 2000  & BP has been controlled at home. Today's BP is elevated at 160/98 Patient relates her elevated BP's to personal stresses caring for a 86 yo mother with Dementia living independently in a different town. Patient has had no complaints of any cardiac type chest pain, palpitations, dyspnea / orthopnea / PND, dizziness, claudication or dependent edema.       Hyperlipidemia is controlled with diet & meds. Patient denies myalgias or other med SE's. Last Lipids were  Lab Results  Component Value Date   CHOL 198 08/07/2022   HDL 86 08/07/2022   LDLCALC 88 08/07/2022   TRIG 140 08/07/2022   CHOLHDL 2.3 08/07/2022     Also, the patient is followed expectantly for glucose intolerance and has had no symptoms of reactive hypoglycemia, diabetic polys, paresthesias or visual blurring.  Last A1c was normal & at goal :  Lab Results  Component Value Date   HGBA1C 5.4 08/07/2022                                                             Further, the patient also has history of Vitamin D Deficiency  ("31" /2011) and supplements vitamin D . Last vitamin D was very low and not at goal  (70-100) :  Lab Results  Component Value Date   VD25OH 38 08/07/2022     Current Outpatient Medications on File Prior to Visit  Medication Sig   ALPRAZolam 0.5 MG  Take 1 tablet 3  times daily as needed for sleep or anxiety.   ASPIRIN 81  Take  3 x a week   busPIRone  15 MG  Take 1/2-1 tab daily   CALCIUM CITRATE Take 1 tablet   daily.   VITAMIN D 1000 UNITS Take 2,000 Units  daily   estradiol 0.5 MG  TAKE 1/2  TABLET  3 TIMES A WEEK    Multiple Vitamin  Take 1 tablet daily.   tiZANidine 2 MG Take 1 tablet  every 8 (eight) hours as needed for muscle spasms.   venlafaxine XR  XR 75 MG Take 1 capsule daily.     Allergies  Allergen Reactions   Doxycycline Hives     PMHx:   Past Medical History:  Diagnosis Date   Adenomatous endometrial hyperplasia    age 65 - hysterectomy    Anxiety    Arthritis    Bundle, branch block, left    Cataract    "beginnings of" per pt   Depression    Hyperlipidemia    no per pt- no meds taken   Hypertension    Melanoma ([place]) 2012   chest   Osteopenia      Immunization History  Administered Date(s) Administered   Influenza Inj Mdck Quad  09/18/2017, 09/03/2018   Influenza 08/05/2013, 09/18/2014, 11/17/2016  Janssen (J&J) SARS-COV-2 Vacc 12/30/2019, 12/30/2019   Moderna SARS-COV2 Booster Vacc 10/06/2020, 10/06/2020   PPD Test 09/30/2014   Pneumococcal -23 06/05/2021   Td 08/30/2005   Zoster, Live 12/18/2016     Past Surgical History:  Procedure Laterality Date   COLONOSCOPY     MELANOMA EXCISION     chest   PARTIAL HYSTERECTOMY  30   uterine polyps   POLYPECTOMY     TONSILLECTOMY      FHx:    Reviewed / unchanged  SHx:    Reviewed / unchanged   Systems Review:  Constitutional: Denies fever, chills, wt changes, headaches, insomnia, fatigue, night sweats, change in appetite. Eyes: Denies redness, blurred vision, diplopia, discharge, itchy, watery eyes.  ENT: Denies discharge, congestion, post nasal drip, epistaxis, sore throat, earache, hearing loss, dental pain, tinnitus, vertigo, sinus pain, snoring.  CV: Denies chest pain, palpitations, irregular heartbeat, syncope, dyspnea, diaphoresis, orthopnea, PND, claudication or edema. Respiratory: denies cough, dyspnea, DOE, pleurisy, hoarseness, laryngitis, wheezing.  Gastrointestinal: Denies dysphagia, odynophagia, heartburn, reflux, water brash, abdominal pain or cramps, nausea,  vomiting, bloating, diarrhea, constipation, hematemesis, melena, hematochezia  or hemorrhoids. Genitourinary: Denies dysuria, frequency, urgency, nocturia, hesitancy, discharge, hematuria or flank pain. Musculoskeletal: Denies arthralgias, myalgias, stiffness, jt. swelling, pain, limping or strain/sprain.  Skin: Denies pruritus, rash, hives, warts, acne, eczema or change in skin lesion(s). Neuro: No weakness, tremor, incoordination, spasms, paresthesia or pain. Psychiatric: Denies confusion, memory loss or sensory loss. Endo: Denies change in weight, skin or hair change.  Heme/Lymph: No excessive bleeding, bruising or enlarged lymph nodes.  Physical Exam  BP (!) 160/98   Pulse 84   Temp 97.9 F (36.6 C)   Resp 16   Ht 5' 4.5" (1.638 m)   Wt 181 lb 12.8 oz (82.5 kg)   SpO2 98%   BMI 30.72 kg/m   Appears  well nourished, well groomed  and in no distress.  Eyes: PERRLA, EOMs, conjunctiva no swelling or erythema. Sinuses: No frontal/maxillary tenderness ENT/Mouth: EAC's clear, TM's nl w/o erythema, bulging. Nares clear w/o erythema, swelling, exudates. Oropharynx clear without erythema or exudates. Oral hygiene is good. Tongue normal, non obstructing. Hearing intact.  Neck: Supple. Thyroid not palpable. Car 2+/2+ without bruits, nodes or JVD. Chest: Respirations nl with BS clear & equal w/o rales, rhonchi, wheezing or stridor.  Cor: Heart sounds normal w/ regular rate and rhythm without sig. murmurs, gallops, clicks or rubs. Peripheral pulses normal and equal  without edema.   Lymphatics: Unremarkable.  Musculoskeletal: Full ROM all peripheral extremities, joint stability, 5/5 strength and normal gait.  Skin: Warm, dry without exposed rashes, lesions or ecchymosis apparent.  Neuro: Cranial nerves intact, reflexes equal bilaterally. Sensory-motor testing grossly intact. Tendon reflexes grossly intact.  Pysch: Alert & oriented x 3.  Insight and judgement nl & appropriate. No  ideations.  Assessment and Plan:   1. Accelerated Hypertension  - Continue medication, monitor blood pressure at home & recommended return f/u OV in 2 weeks Reminder to go to the ER if any CP,  SOB, nausea, dizziness, severe HA, changes vision/speech.   - olmesartan (BENICAR) 40 MG tablet; Take 1/2 to 1 tablet at night  for BP  Dispense: 90 tablet; Refill: 3 - bisoprolol (ZEBETA) 10 MG tablet; Take  1/2 to 1 tablet  every Morning for BP  Dispense: 90 tablet; Refill: 3  -   2. Attention deficit hyperactivity disorder (ADHD), predominantly inattentive type  - Patient indicated desire to switch back from Ritalin to  low dose Adderall  - amphetamine-dextroamphetamine (ADDERALL) 5 MG tablet;  Take  1/2 to 1 tablet   2 x /day   for ADD, Focus & Concentration   Dispense: 180 tablet; Refill: 0        Discussed  regular exercise, BP monitoring, weight control to achieve/maintain BMI less than 25 and discussed med and SE's. Recommended labs to assess /monitor clinical status .  I discussed the assessment and treatment plan with the patient. The patient was provided an opportunity to ask questions and all were answered. The patient agreed with the plan and demonstrated an understanding of the instructions.  I provided over 30 minutes of exam, counseling, chart review and  complex critical decision making.        The patient was advised to call back or seek an in-person evaluation if the symptoms worsen or if the condition fails to improve as anticipated.   Kirtland Bouchard, MD

## 2022-10-24 ENCOUNTER — Ambulatory Visit: Payer: Medicare Other | Admitting: Internal Medicine

## 2022-10-31 ENCOUNTER — Ambulatory Visit: Payer: Medicare Other | Admitting: Nurse Practitioner

## 2022-11-26 NOTE — Progress Notes (Deleted)
Assessment and Plan:  There are no diagnoses linked to this encounter.    Further disposition pending results of labs. Discussed med's effects and SE's.   Over 30 minutes of exam, counseling, chart review, and critical decision making was performed.   Future Appointments  Date Time Provider [place]  11/27/2022  3:30 PM Alycia Rossetti, NP [facility] None  02/11/2023  2:30 PM Alycia Rossetti, NP [facility] None  08/11/2023  2:00 PM Alycia Rossetti, NP [facility] None    ------------------------------------------------------------------------------------------------------------------   HPI There were no vitals taken for this visit. 67 y.o.female presents for  Past Medical History:  Diagnosis Date   Adenomatous endometrial hyperplasia    age 50 - hysterectomy    Anxiety    Arthritis    Bundle, branch block, left    Cataract    "beginnings of" per pt   Depression    Hyperlipidemia    no per pt- no meds taken   Hypertension    Melanoma ([place]) 2012   chest   Osteopenia      Allergies  Allergen Reactions   Doxycycline Hives    Current Outpatient Medications on File Prior to Visit  Medication Sig   ALPRAZolam (XANAX) 0.5 MG tablet Take 1 tablet (0.5 mg total) by mouth 3 (three) times daily as needed for sleep or anxiety.   amphetamine-dextroamphetamine (ADDERALL) 5 MG tablet Take  1/2 to 1 tablet   2 x /day   for ADD, Focus & Concentration   ASPIRIN 81 PO Take by mouth. 3 x a week   bisoprolol (ZEBETA) 10 MG tablet Take  1/2 to 1 tablet  every Morning for BP   busPIRone (BUSPAR) 15 MG tablet Take 1/2-1 tab daily   CALCIUM CITRATE PO Take 1 tablet by mouth daily.   cholecalciferol (VITAMIN D) 1000 UNITS tablet Take 2,000 Units by mouth daily. 2000 units daily   estradiol (ESTRACE) 0.5 MG tablet TAKE 1/2 TABLET BY MOUTH 3 TIMES A WEEK   Multiple Vitamin (MULTIVITAMIN) tablet Take 1 tablet by mouth daily.   olmesartan (BENICAR) 40 MG tablet Take 1/2 to 1  tablet at night  for BP   tiZANidine (ZANAFLEX) 2 MG tablet Take 1 tablet (2 mg total) by mouth every 8 (eight) hours as needed for muscle spasms.   venlafaxine XR (EFFEXOR XR) 75 MG 24 hr capsule Take 1 capsule (75 mg total) by mouth daily.   No current facility-administered medications on file prior to visit.    ROS: all negative except above.   Physical Exam:  There were no vitals taken for this visit.  General Appearance: Well nourished, in no apparent distress. Eyes: PERRLA, EOMs, conjunctiva no swelling or erythema Sinuses: No Frontal/maxillary tenderness ENT/Mouth: Ext aud canals clear, TMs without erythema, bulging. No erythema, swelling, or exudate on post pharynx.  Tonsils not swollen or erythematous. Hearing normal.  Neck: Supple, thyroid normal.  Respiratory: Respiratory effort normal, BS equal bilaterally without rales, rhonchi, wheezing or stridor.  Cardio: RRR with no MRGs. Brisk peripheral pulses without edema.  Abdomen: Soft, + BS.  Non tender, no guarding, rebound, hernias, masses. Lymphatics: Non tender without lymphadenopathy.  Musculoskeletal: Full ROM, 5/5 strength, normal gait.  Skin: Warm, dry without rashes, lesions, ecchymosis.  Neuro: Cranial nerves intact. Normal muscle tone, no cerebellar symptoms. Sensation intact.  Psych: Awake and oriented X 3, normal affect, Insight and Judgment appropriate.     Alycia Rossetti, NP 12:30 PM [facility]

## 2022-11-27 ENCOUNTER — Ambulatory Visit: Payer: Medicare Other | Admitting: Nurse Practitioner

## 2022-11-30 IMAGING — MG MM DIGITAL SCREENING BILAT W/ TOMO AND CAD
6 of 10 series · 6 of 30 positions shown · non-contrast
Comparison: Previous exam(s).

CLINICAL DATA: Screening.

EXAM:
DIGITAL SCREENING BILATERAL MAMMOGRAM WITH TOMOSYNTHESIS AND CAD
TECHNIQUE: Bilateral screening digital craniocaudal and mediolateral oblique
mammograms were obtained. Bilateral screening digital breast
tomosynthesis was performed. The images were evaluated with
computer-aided detection.

[R MLO synth-2D (1 of 2)]
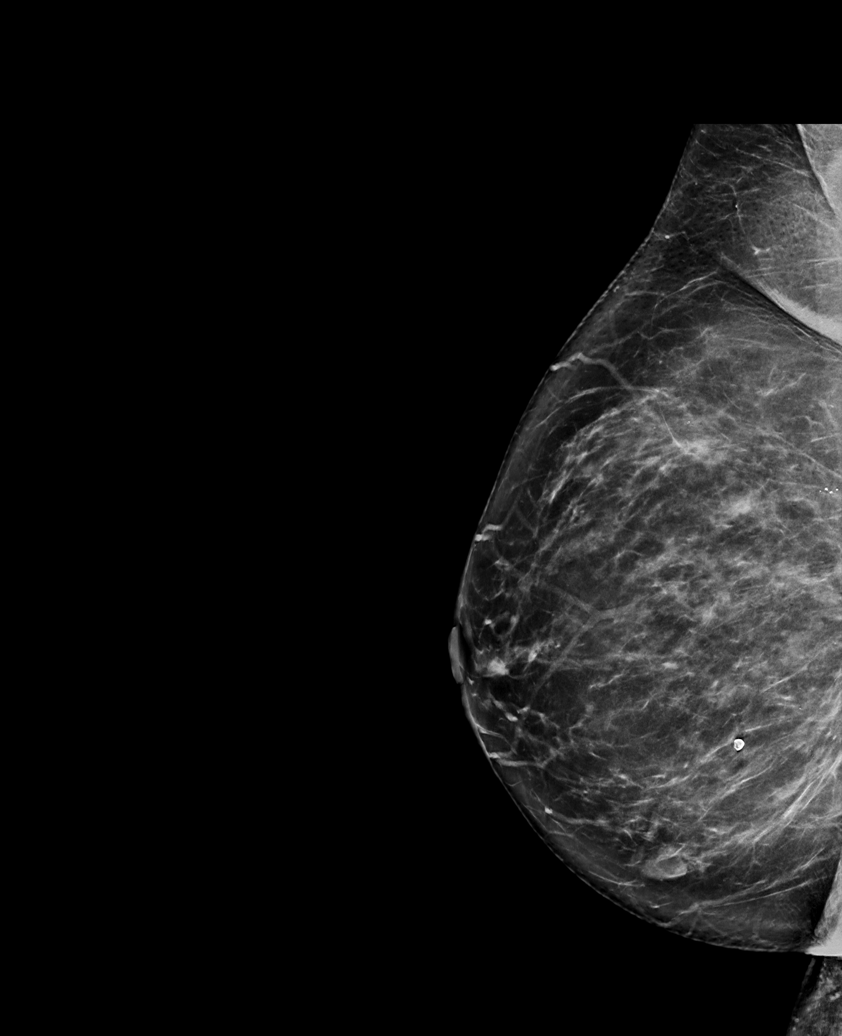

[L CC synth-2D]
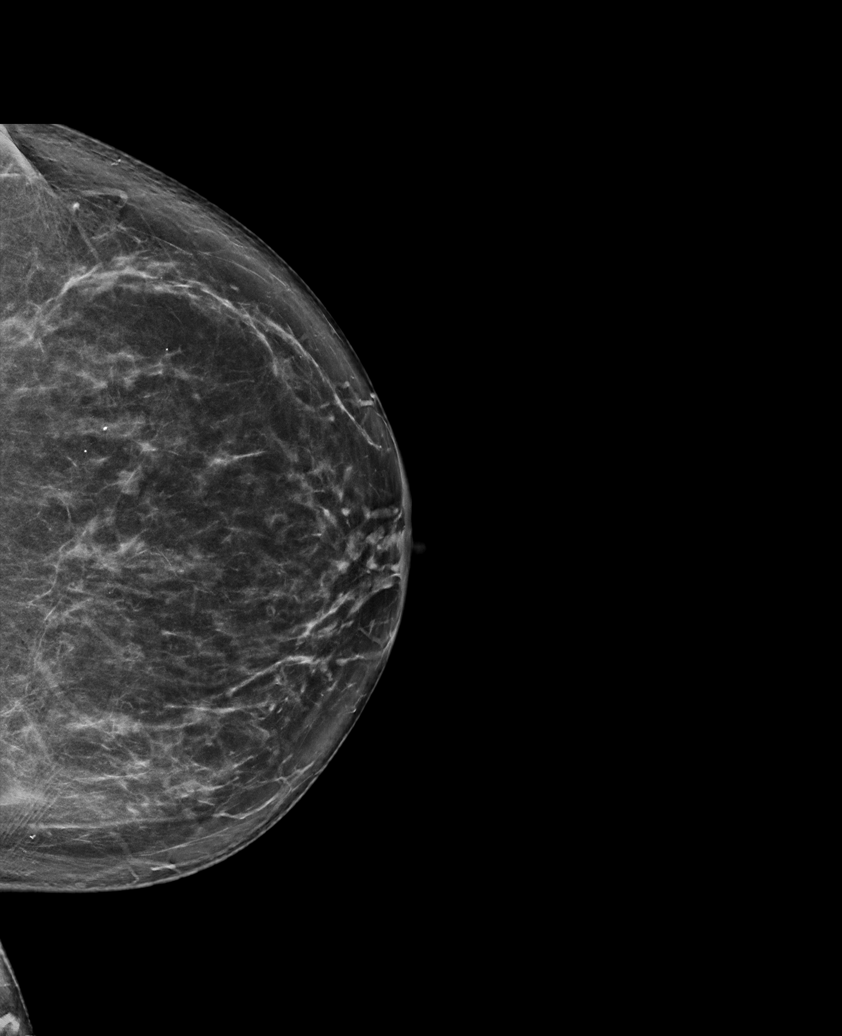

[R MLO synth-2D (2 of 2)]
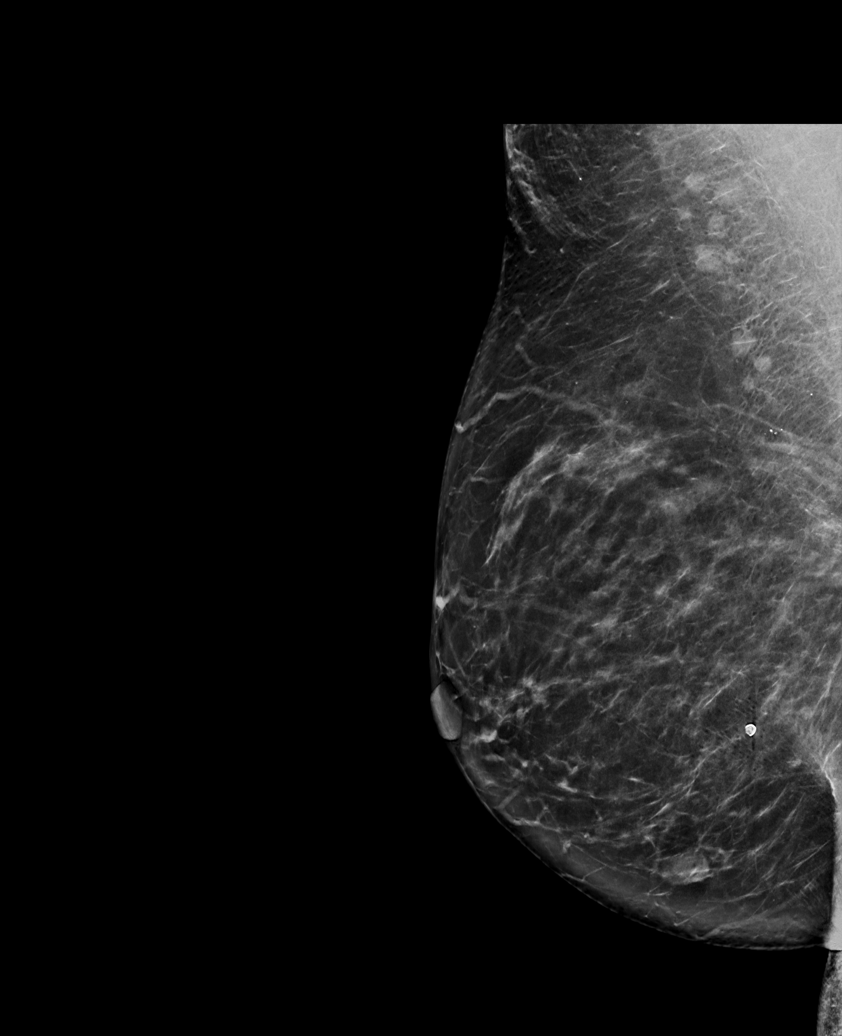

[R CC synth-2D]
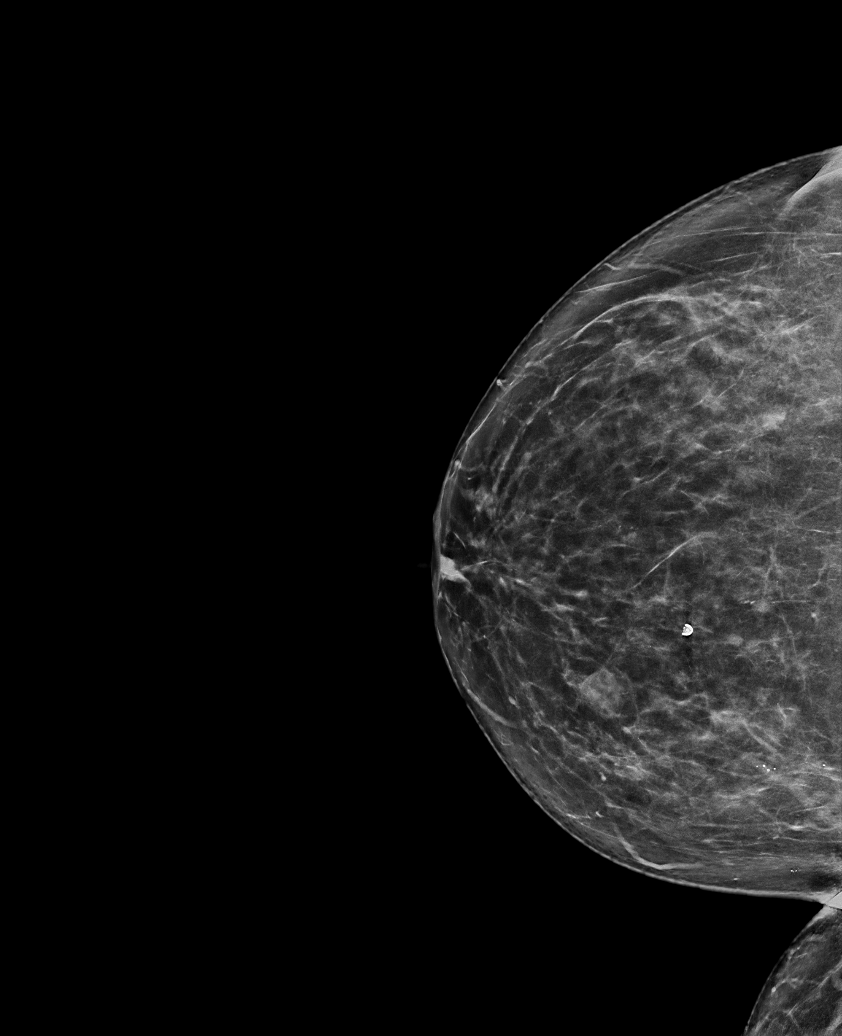

[L MLO synth-2D]
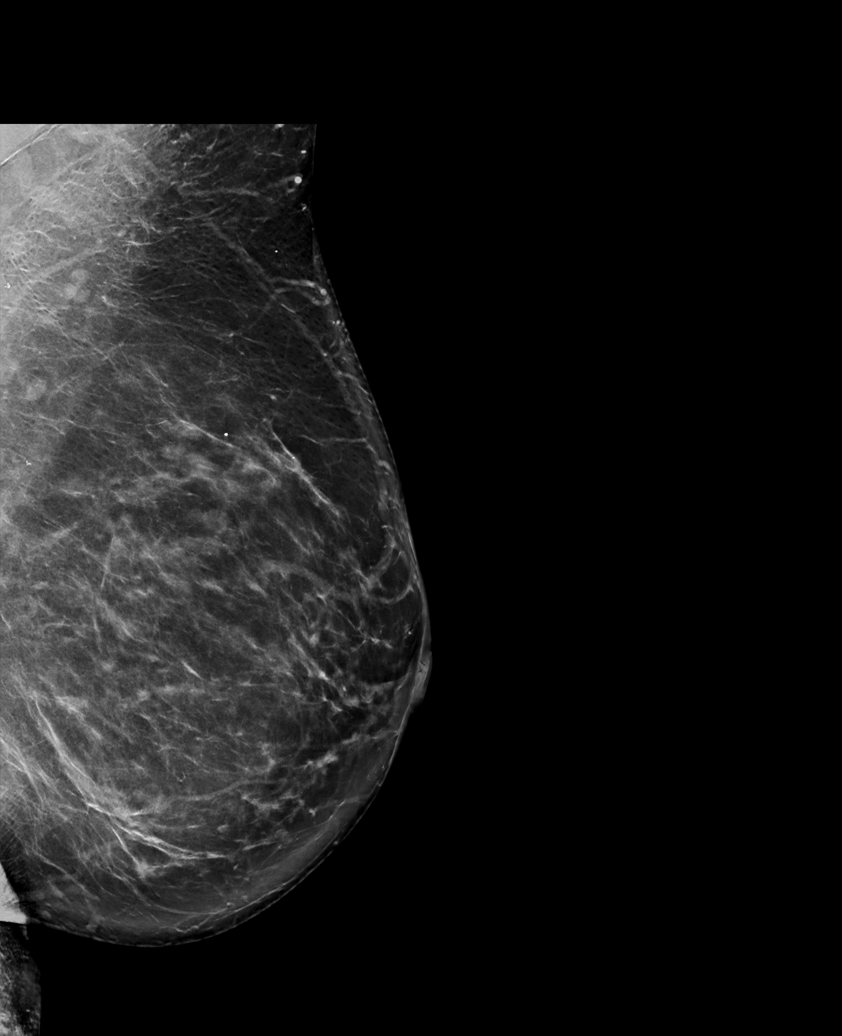

[R CC tomo · tomo slice 40/79.0]
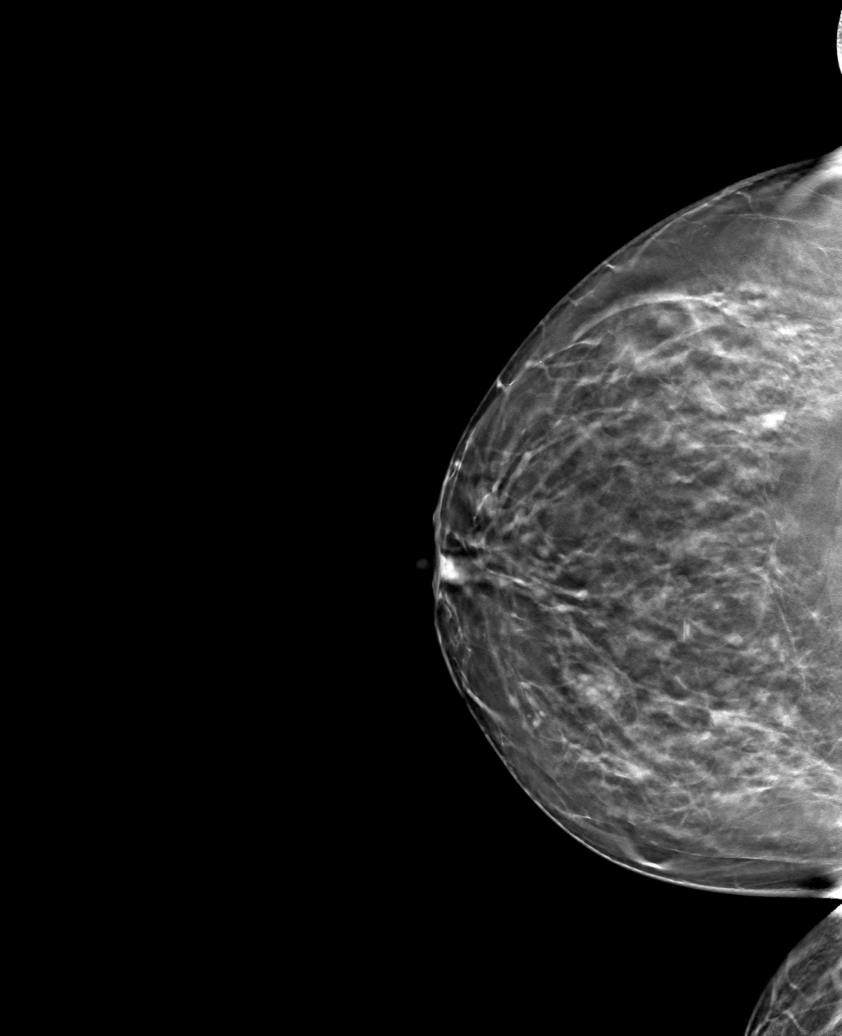

[6 of 30 positions shown; findings below may reference images not displayed]

ACR Breast Density Category b: There are scattered areas of
fibroglandular density.
FINDINGS: There are no findings suspicious for malignancy.
IMPRESSION: No mammographic evidence of malignancy. A result letter of this
screening mammogram will be mailed directly to the patient.

RECOMMENDATION:
Screening mammogram in one year. (Code:51-O-LD2)

BI-RADS CATEGORY  1: Negative.

## 2022-12-04 NOTE — Progress Notes (Unsigned)
Assessment and Plan:  There are no diagnoses linked to this encounter.    Further disposition pending results of labs. Discussed med's effects and SE's.   Over 30 minutes of exam, counseling, chart review, and critical decision making was performed.   Future Appointments  Date Time Provider [place]  12/05/2022  2:30 PM Alycia Rossetti, NP [facility] None  02/11/2023  2:30 PM Alycia Rossetti, NP [facility] None  08/11/2023  2:00 PM Alycia Rossetti, NP [facility] None    ------------------------------------------------------------------------------------------------------------------   HPI There were no vitals taken for this visit. 67 y.o.female presents for  Past Medical History:  Diagnosis Date   Adenomatous endometrial hyperplasia    age 67 - hysterectomy    Anxiety    Arthritis    Bundle, branch block, left    Cataract    "beginnings of" per pt   Depression    Hyperlipidemia    no per pt- no meds taken   Hypertension    Melanoma ([place]) 2012   chest   Osteopenia      Allergies  Allergen Reactions   Doxycycline Hives    Current Outpatient Medications on File Prior to Visit  Medication Sig   ALPRAZolam (XANAX) 0.5 MG tablet Take 1 tablet (0.5 mg total) by mouth 3 (three) times daily as needed for sleep or anxiety.   amphetamine-dextroamphetamine (ADDERALL) 5 MG tablet Take  1/2 to 1 tablet   2 x /day   for ADD, Focus & Concentration   ASPIRIN 81 PO Take by mouth. 3 x a week   bisoprolol (ZEBETA) 10 MG tablet Take  1/2 to 1 tablet  every Morning for BP   busPIRone (BUSPAR) 15 MG tablet Take 1/2-1 tab daily   CALCIUM CITRATE PO Take 1 tablet by mouth daily.   cholecalciferol (VITAMIN D) 1000 UNITS tablet Take 2,000 Units by mouth daily. 2000 units daily   estradiol (ESTRACE) 0.5 MG tablet TAKE 1/2 TABLET BY MOUTH 3 TIMES A WEEK   Multiple Vitamin (MULTIVITAMIN) tablet Take 1 tablet by mouth daily.   olmesartan (BENICAR) 40 MG tablet Take 1/2 to 1  tablet at night  for BP   tiZANidine (ZANAFLEX) 2 MG tablet Take 1 tablet (2 mg total) by mouth every 8 (eight) hours as needed for muscle spasms.   venlafaxine XR (EFFEXOR XR) 75 MG 24 hr capsule Take 1 capsule (75 mg total) by mouth daily.   No current facility-administered medications on file prior to visit.    ROS: all negative except above.   Physical Exam:  There were no vitals taken for this visit.  General Appearance: Well nourished, in no apparent distress. Eyes: PERRLA, EOMs, conjunctiva no swelling or erythema Sinuses: No Frontal/maxillary tenderness ENT/Mouth: Ext aud canals clear, TMs without erythema, bulging. No erythema, swelling, or exudate on post pharynx.  Tonsils not swollen or erythematous. Hearing normal.  Neck: Supple, thyroid normal.  Respiratory: Respiratory effort normal, BS equal bilaterally without rales, rhonchi, wheezing or stridor.  Cardio: RRR with no MRGs. Brisk peripheral pulses without edema.  Abdomen: Soft, + BS.  Non tender, no guarding, rebound, hernias, masses. Lymphatics: Non tender without lymphadenopathy.  Musculoskeletal: Full ROM, 5/5 strength, normal gait.  Skin: Warm, dry without rashes, lesions, ecchymosis.  Neuro: Cranial nerves intact. Normal muscle tone, no cerebellar symptoms. Sensation intact.  Psych: Awake and oriented X 3, normal affect, Insight and Judgment appropriate.     Alycia Rossetti, NP 12:58 PM [facility]

## 2022-12-05 ENCOUNTER — Ambulatory Visit (INDEPENDENT_AMBULATORY_CARE_PROVIDER_SITE_OTHER): Payer: Medicare Other | Admitting: Nurse Practitioner

## 2022-12-05 ENCOUNTER — Encounter: Payer: Self-pay | Admitting: Nurse Practitioner

## 2022-12-05 VITALS — BP 118/80 | HR 68 | Temp 97.9°F | Ht 64.5 in | Wt 184.4 lb

## 2022-12-05 DIAGNOSIS — E669 Obesity, unspecified: Secondary | ICD-10-CM

## 2022-12-05 DIAGNOSIS — R209 Unspecified disturbances of skin sensation: Secondary | ICD-10-CM | POA: Diagnosis not present

## 2022-12-05 DIAGNOSIS — Z79899 Other long term (current) drug therapy: Secondary | ICD-10-CM | POA: Diagnosis not present

## 2022-12-05 DIAGNOSIS — L989 Disorder of the skin and subcutaneous tissue, unspecified: Secondary | ICD-10-CM

## 2022-12-05 DIAGNOSIS — I10 Essential (primary) hypertension: Secondary | ICD-10-CM | POA: Diagnosis not present

## 2022-12-05 NOTE — Patient Instructions (Signed)
Referred to [facility] for evaluation of skin breakdown left foot  Raynaud's Phenomenon  Raynaud's phenomenon is a condition that affects the blood vessels (arteries) that carry blood to the fingers and toes. The arteries that supply blood to the ears, lips, nipples, or the tip of the nose might also be affected. Raynaud's phenomenon causes the arteries to become narrow temporarily (spasm). As a result, the flow of blood to the affected areas is temporarily decreased. This usually occurs in response to cold temperatures or stress. During an attack, the skin in the affected areas turns white, then blue, and finally red. A person may also feel tingling or numbness in those areas. Attacks usually last for only a brief period, and then the blood flow to the area returns to normal. In most cases, Raynaud's phenomenon does not cause serious health problems. What are the causes? In many cases, the cause of this condition is not known. The condition may occur on its own (primary Raynaud's phenomenon) or may be associated with other diseases or factors (secondary Raynaud's phenomenon). Possible causes may include: Diseases or medical conditions that damage the arteries. Injuries and repetitive actions that hurt the hands or feet. Being exposed to certain chemicals. Taking medicines that narrow the arteries. Other medical conditions, such as lupus, scleroderma, rheumatoid arthritis, thyroid problems, blood disorders, Sjogren syndrome, or atherosclerosis. What increases the risk? The following factors may make you more likely to develop this condition: Being 54-34 years old. Being female. Having a family history of Raynaud's phenomenon. Living in a cold climate. Smoking. What are the signs or symptoms? Symptoms of this condition usually occur when you are exposed to cold temperatures or when you have emotional stress. The symptoms may last for a few minutes or up to several hours. They usually  affect your fingers but may also affect your toes, nipples, lips, ears, or the tip of your nose. Symptoms may include: Changes in skin color. The skin in the affected areas will turn pale or white. The skin may then change from white to bluish to red as normal blood flow returns to the area. Numbness, tingling, or pain in the affected areas. In severe cases, symptoms may include: Skin sores. Tissues decaying and dying (gangrene). How is this diagnosed? This condition may be diagnosed based on: Your symptoms and medical history. A physical exam. During the exam, you may be asked to put your hands in cold water to check for a reaction to cold temperature. Tests, such as: Blood tests to check for other diseases or conditions. A test to check the movement of blood through your arteries and veins (vascular ultrasound). A test in which the skin at the base of your fingernail is examined under a microscope (nailfold capillaroscopy). How is this treated? During an episode, you can take actions to help symptoms go away faster. Options include moving your arms around in a windmill pattern, warming your fingers under warm water, or placing your fingers in a warm body fold, such as your armpit. Long-term treatment for this condition often involves making lifestyle changes and taking steps to control your exposure to cold temperature. For more severe cases, medicine (calcium channel blockers) may be used to improve blood circulation. Follow these instructions at home: Avoiding cold temperatures Take these steps to avoid exposure to cold: If possible, stay indoors during cold weather. When you go outside during cold weather, dress in layers and wear mittens, a hat, a scarf, and warm footwear. Wear mittens or  gloves when handling ice or frozen food. Use holders for glasses or cans containing cold drinks. Let warm water run for a while before taking a shower or bath. Warm up the car before driving in cold  weather. Lifestyle If possible, avoid stressful and emotional situations. Try to find ways to manage your stress, such as: Exercise. Yoga. Meditation. Biofeedback. Do not use any products that contain nicotine or tobacco. These products include cigarettes, chewing tobacco, and vaping devices, such as e-cigarettes. If you need help quitting, ask your health care provider. Avoid secondhand smoke. Limit your use of caffeine. Switch to decaffeinated coffee, tea, and soda. Avoid chocolate. Avoid vibrating tools and machinery. General instructions Protect your hands and feet from injuries, cuts, or bruises. Avoid wearing tight rings or wristbands. Wear loose fitting socks and comfortable, roomy shoes. Take over-the-counter and prescription medicines only as told by your health care provider. Where to find support Raynaud's Association: www.raynauds.org Where to find more information [employer] of Arthritis and Musculoskeletal and Skin Diseases: www.niams.SouthExposed.es Contact a health care provider if: Your discomfort becomes worse despite lifestyle changes. You develop sores on your fingers or toes that do not heal. You have breaks in the skin on your fingers or toes. You have a fever. You have pain or swelling in your joints. You have a rash. Your symptoms occur on only one side of your body. Get help right away if: Your fingers or toes turn black. You have severe pain in the affected areas. These symptoms may represent a serious problem that is an emergency. Do not wait to see if the symptoms will go away. Get medical help right away. Call your local emergency services (911 in the [place] Do not drive yourself to the hospital. Summary Raynaud's phenomenon is a condition that affects the arteries that carry blood to the fingers, toes, ears, lips, nipples, or the tip of the nose. In many cases, the cause of this condition is not known. Symptoms of this condition include changes in  skin color along with numbness and tingling in the affected area. Treatment for this condition includes lifestyle changes and reducing exposure to cold temperatures. Medicines may be used for severe cases of the condition. Contact your health care provider if your condition worsens despite treatment. This information is not intended to replace advice given to you by your health care provider. Make sure you discuss any questions you have with your health care provider. Document Revised: 12/12/2020 Document Reviewed: 12/12/2020 Elsevier Patient Education  [place].

## 2022-12-06 ENCOUNTER — Encounter: Payer: Self-pay | Admitting: Podiatry

## 2022-12-06 ENCOUNTER — Ambulatory Visit: Payer: Medicare Other | Admitting: Podiatry

## 2022-12-06 DIAGNOSIS — D225 Melanocytic nevi of trunk: Secondary | ICD-10-CM | POA: Insufficient documentation

## 2022-12-06 DIAGNOSIS — L578 Other skin changes due to chronic exposure to nonionizing radiation: Secondary | ICD-10-CM | POA: Insufficient documentation

## 2022-12-06 DIAGNOSIS — L821 Other seborrheic keratosis: Secondary | ICD-10-CM | POA: Insufficient documentation

## 2022-12-06 DIAGNOSIS — L820 Inflamed seborrheic keratosis: Secondary | ICD-10-CM | POA: Insufficient documentation

## 2022-12-06 DIAGNOSIS — I7300 Raynaud's syndrome without gangrene: Secondary | ICD-10-CM | POA: Diagnosis not present

## 2022-12-06 DIAGNOSIS — L719 Rosacea, unspecified: Secondary | ICD-10-CM | POA: Insufficient documentation

## 2022-12-06 DIAGNOSIS — H524 Presbyopia: Secondary | ICD-10-CM | POA: Diagnosis not present

## 2022-12-06 LAB — SEDIMENTATION RATE: Sed Rate: 2 mm/h (ref 0–30)

## 2022-12-06 LAB — ANA: Anti Nuclear Antibody (ANA): NEGATIVE

## 2022-12-06 MED ORDER — AMBULATORY NON FORMULARY MEDICATION: 99 refills | Status: DC

## 2022-12-09 NOTE — Progress Notes (Signed)
Subjective:   Patient ID: Maureen Henderson, female   DOB: 67 y.o.   MRN: [id number]   HPI Chief Complaint  Patient presents with   Callouses    Pt stated that her toes are peeling and skin is cold    68 year old female presents the office today with concerns of her toes peeling in the cold sensation to her toes.  At first she noticed dark spots on the toes but no open lesions that she reports and she does not report any drainage or pus.  She has not had any fevers or chills.  No injuries.   Review of Systems  All other systems reviewed and are negative.  Past Medical History:  Diagnosis Date   Adenomatous endometrial hyperplasia    age 42 - hysterectomy    Anxiety    Arthritis    Bundle, branch block, left    Cataract    "beginnings of" per pt   Depression    Hyperlipidemia    no per pt- no meds taken   Hypertension    Melanoma ([place]) 2012   chest   Osteopenia     Past Surgical History:  Procedure Laterality Date   COLONOSCOPY     MELANOMA EXCISION     chest   PARTIAL HYSTERECTOMY  30   uterine polyps   POLYPECTOMY     TONSILLECTOMY       Current Outpatient Medications:    AMBULATORY NON FORMULARY MEDICATION, Nifedipine 3% pentoxifyline 5% cream, Disp: 60 g, Rfl: PRN   ALPRAZolam (XANAX) 0.5 MG tablet, Take 1 tablet (0.5 mg total) by mouth 3 (three) times daily as needed for sleep or anxiety. (Patient not taking: Reported on 12/05/2022), Disp: 30 tablet, Rfl: 0   amphetamine-dextroamphetamine (ADDERALL) 5 MG tablet, Take  1/2 to 1 tablet   2 x /day   for ADD, Focus & Concentration, Disp: 180 tablet, Rfl: 0   ASPIRIN 81 PO, Take by mouth. 3 x a week, Disp: , Rfl:    bisoprolol (ZEBETA) 10 MG tablet, Take  1/2 to 1 tablet  every Morning for BP, Disp: 90 tablet, Rfl: 3   busPIRone (BUSPAR) 15 MG tablet, Take 1/2-1 tab daily, Disp: 30 tablet, Rfl: 3   CALCIUM CITRATE PO, Take 1 tablet by mouth daily., Disp: , Rfl:    cholecalciferol (VITAMIN D) 1000 UNITS tablet, Take  2,000 Units by mouth daily. 2000 units daily, Disp: , Rfl:    estradiol (ESTRACE) 0.5 MG tablet, TAKE 1/2 TABLET BY MOUTH 3 TIMES A WEEK, Disp: 90 tablet, Rfl: 3   Multiple Vitamin (MULTIVITAMIN) tablet, Take 1 tablet by mouth daily. (Patient not taking: Reported on 12/05/2022), Disp: , Rfl:    olmesartan (BENICAR) 40 MG tablet, Take 1/2 to 1 tablet at night  for BP, Disp: 90 tablet, Rfl: 3   Red Yeast Rice 600 MG CAPS, as directed Orally, Disp: , Rfl:    tiZANidine (ZANAFLEX) 2 MG tablet, Take 1 tablet (2 mg total) by mouth every 8 (eight) hours as needed for muscle spasms., Disp: 30 tablet, Rfl: 0   venlafaxine XR (EFFEXOR XR) 75 MG 24 hr capsule, Take 1 capsule (75 mg total) by mouth daily., Disp: 90 capsule, Rfl: 3  Allergies  Allergen Reactions   Doxycycline Hives          Objective:  Physical Exam  General: AAO x3, NAD  Dermatological: Dry, peeling skin present to the toes.  There is purple discoloration of some of the digit  as well as red discoloration to others.  There is no evidence of gangrene or open lesions/ulcerations today.  No drainage or pus.  No fluctuance or crepitation.  Vascular: Dorsalis Pedis artery and Posterior Tibial artery pedal pulses are 2/4 bilateral with immedate capillary fill time.  There is no pain with calf compression, swelling, warmth, erythema.   Neruologic: Grossly intact via light touch bilateral.   Musculoskeletal: No significant pain on exam.  Gait: Unassisted, Nonantalgic.       Assessment:   Likely Raynaud's     Plan:  -Treatment options discussed including all alternatives, risks, and complications -Etiology of symptoms were discussed -Will order compound cream through [facility] to help with circulation.  Also has not been a benefit from amlodipine or other calcium channel blocker but that she is already on blood pressure medicine but hold off on this and will defer to her primary care doctor.  Discussed keeping the feet  warm.  Monitor any signs or symptoms of skin breakdown, ulcerations or infection.  Trula Slade DPM

## 2022-12-24 ENCOUNTER — Other Ambulatory Visit: Payer: Self-pay | Admitting: Nurse Practitioner

## 2022-12-24 ENCOUNTER — Encounter: Payer: Self-pay | Admitting: Nurse Practitioner

## 2022-12-24 DIAGNOSIS — F411 Generalized anxiety disorder: Secondary | ICD-10-CM

## 2022-12-24 MED ORDER — ALPRAZOLAM 0.5 MG PO TABS: 0.5000 mg | ORAL_TABLET | Freq: Every day | ORAL | 0 refills | Status: DC

## 2022-12-24 NOTE — Progress Notes (Signed)
PDMP is reviewed and appropriate  

## 2023-01-02 ENCOUNTER — Other Ambulatory Visit: Payer: Self-pay | Admitting: Nurse Practitioner

## 2023-01-02 ENCOUNTER — Encounter: Payer: Self-pay | Admitting: Nurse Practitioner

## 2023-01-02 DIAGNOSIS — F322 Major depressive disorder, single episode, severe without psychotic features: Secondary | ICD-10-CM

## 2023-01-02 MED ORDER — VENLAFAXINE HCL ER 75 MG PO CP24: 150.0000 mg | ORAL_CAPSULE | Freq: Every day | ORAL | 2 refills | Status: DC

## 2023-01-07 ENCOUNTER — Telehealth: Payer: Self-pay | Admitting: Nurse Practitioner

## 2023-01-07 ENCOUNTER — Other Ambulatory Visit: Payer: Self-pay | Admitting: Nurse Practitioner

## 2023-01-07 DIAGNOSIS — F411 Generalized anxiety disorder: Secondary | ICD-10-CM

## 2023-01-07 MED ORDER — VENLAFAXINE HCL ER 150 MG PO CP24: 150.0000 mg | ORAL_CAPSULE | Freq: Every day | ORAL | 6 refills | Status: DC

## 2023-01-07 NOTE — Telephone Encounter (Signed)
Pt is wanting a refill on 150 mg capsule instead of having to take 2 tablets 75 mg of Effexor

## 2023-02-11 ENCOUNTER — Encounter: Payer: Self-pay | Admitting: Nurse Practitioner

## 2023-02-11 ENCOUNTER — Ambulatory Visit (INDEPENDENT_AMBULATORY_CARE_PROVIDER_SITE_OTHER): Payer: Medicare Other | Admitting: Nurse Practitioner

## 2023-02-11 VITALS — BP 130/82 | HR 66 | Temp 97.5°F | Ht 64.5 in | Wt 180.6 lb

## 2023-02-11 DIAGNOSIS — E559 Vitamin D deficiency, unspecified: Secondary | ICD-10-CM

## 2023-02-11 DIAGNOSIS — F900 Attention-deficit hyperactivity disorder, predominantly inattentive type: Secondary | ICD-10-CM

## 2023-02-11 DIAGNOSIS — E782 Mixed hyperlipidemia: Secondary | ICD-10-CM

## 2023-02-11 DIAGNOSIS — Z79899 Other long term (current) drug therapy: Secondary | ICD-10-CM | POA: Diagnosis not present

## 2023-02-11 DIAGNOSIS — R632 Polyphagia: Secondary | ICD-10-CM

## 2023-02-11 DIAGNOSIS — F411 Generalized anxiety disorder: Secondary | ICD-10-CM | POA: Diagnosis not present

## 2023-02-11 DIAGNOSIS — I10 Essential (primary) hypertension: Secondary | ICD-10-CM | POA: Diagnosis not present

## 2023-02-11 DIAGNOSIS — F322 Major depressive disorder, single episode, severe without psychotic features: Secondary | ICD-10-CM

## 2023-02-11 MED ORDER — VENLAFAXINE HCL ER 37.5 MG PO CP24: 37.5000 mg | ORAL_CAPSULE | Freq: Every day | ORAL | 2 refills | Status: DC

## 2023-02-11 MED ORDER — ALPRAZOLAM 0.5 MG PO TABS: 0.5000 mg | ORAL_TABLET | Freq: Every day | ORAL | 0 refills | Status: DC

## 2023-02-11 MED ORDER — VENLAFAXINE HCL ER 75 MG PO CP24: 75.0000 mg | ORAL_CAPSULE | Freq: Every day | ORAL | 2 refills | Status: DC

## 2023-02-11 NOTE — Patient Instructions (Signed)
Managing Depression, Adult Depression is a mental health condition that affects your thoughts, feelings, and actions. Being diagnosed with depression can bring you relief if you did not know why you have felt or behaved a certain way. It could also leave you feeling overwhelmed. Finding ways to manage your symptoms can help you feel more positive about your future. How to manage lifestyle changes Being depressed is difficult. Depression can increase the level of everyday stress. Stress can make depression symptoms worse. You may believe your symptoms cannot be managed or will never improve. However, there are many things you can try to help manage your symptoms. There is hope. Managing stress  Stress is your body's reaction to life changes and events, both good and bad. Stress can add to your feelings of depression. Learning to manage your stress can help lessen your feelings of depression. Try some of the following approaches to reducing your stress (stress reduction techniques): Listen to music that you enjoy and that inspires you. Try using a meditation app or take a meditation class. Develop a practice that helps you connect with your spiritual self. Walk in nature, pray, or go to a place of worship. Practice deep breathing. To do this, inhale slowly through your nose. Pause at the top of your inhale for a few seconds and then exhale slowly, letting yourself relax. Repeat this three or four times. Practice yoga to help relax and work your muscles. Choose a stress reduction technique that works for you. These techniques take time and practice to develop. Set aside 5-15 minutes a day to do them. Therapists can offer training in these techniques. Do these things to help manage stress: Keep a journal. Know your limits. Set healthy boundaries for yourself and others, such as saying "no" when you think something is too much. Pay attention to how you react to certain situations. You may not be able to  control everything, but you can change your reaction. Add humor to your life by watching funny movies or shows. Make time for activities that you enjoy and that relax you. Spend less time using electronics, especially at night before bed. The light from screens can make your brain think it is time to get up rather than go to bed.  Medicines Medicines, such as antidepressants, are often a part of treatment for depression. Talk with your pharmacist or health care provider about all the medicines, supplements, and herbal products that you take, their possible side effects, and what medicines and other products are safe to take together. Make sure to report any side effects you may have to your health care provider. Relationships Your health care provider may suggest family therapy, couples therapy, or individual therapy as part of your treatment. How to recognize changes Everyone responds differently to treatment for depression. As you recover from depression, you may start to: Have more interest in doing activities. Feel more hopeful. Have more energy. Eat a more regular amount of food. Have better mental focus. It is important to recognize if your depression is not getting better or is getting worse. The symptoms you had in the beginning may return, such as: Feeling tired. Eating too much or too little. Sleeping too much or too little. Feeling restless, agitated, or hopeless. Trouble focusing or making decisions. Having unexplained aches and pains. Feeling irritable, angry, or aggressive. If you or your family members notice these symptoms coming back, let your health care provider know right away. Follow these instructions at home: Activity Try to   get some form of exercise each day, such as walking. Try yoga, mindfulness, or other stress reduction techniques. Participate in group activities if you are able. Lifestyle Get enough sleep. Cut down on or stop using caffeine, tobacco,  alcohol, and any other harmful substances. Eat a healthy diet that includes plenty of vegetables, fruits, whole grains, low-fat dairy products, and lean protein. Limit foods that are high in solid fats, added sugar, or salt (sodium). General instructions Take over-the-counter and prescription medicines only as told by your health care provider. Keep all follow-up visits. It is important for your health care provider to check on your mood, behavior, and medicines. Your health care provider may need to make changes to your treatment. Where to find support Talking to others  Friends and family members can be sources of support and guidance. Talk to trusted friends or family members about your condition. Explain your symptoms and let them know that you are working with a health care provider to treat your depression. Tell friends and family how they can help. Finances Find mental health providers that fit with your financial situation. Talk with your health care provider if you are worried about access to food, housing, or medicine. Call your insurance company to learn about your co-pays and prescription plan. Where to find more information You can find support in your area from: Anxiety and Depression Association of America (ADAA): adaa.org Mental Health America: mentalhealthamerica.net National Alliance on Mental Illness: nami.org Contact a health care provider if: You stop taking your antidepressant medicines, and you have any of these symptoms: Nausea. Headache. Light-headedness. Chills and body aches. Not being able to sleep (insomnia). You or your friends and family think your depression is getting worse. Get help right away if: You have thoughts of hurting yourself or others. Get help right away if you feel like you may hurt yourself or others, or have thoughts about taking your own life. Go to your nearest emergency room or: Call 911. Call the National Suicide Prevention Lifeline at  1-800-273-8255 or 988. This is open 24 hours a day. Text the Crisis Text Line at 741741. This information is not intended to replace advice given to you by your health care provider. Make sure you discuss any questions you have with your health care provider. Document Revised: 02/12/2022 Document Reviewed: 02/12/2022 Elsevier Patient Education  2023 [employer]  

## 2023-02-11 NOTE — Progress Notes (Signed)
FOLLOW UP  Assessment and Plan:   Maureen Henderson was seen today for follow-up.  Diagnoses and all orders for this visit:  Essential hypertension - continue medications, DASH diet, exercise and monitor at home. Call if greater than 130/80.  - Currently taking bisoprolol 10 mg 1/2 tab in AM and Olmesartan 40 mg PQM  Needle phobia (prefers minimal blood draws) Patient preference to defer blood draw today; last labs reviewed; no urgent follow up indicated at this time. Will defer all labs to CPE. See anxiety plan -   Anxiety state Currently taking Effexor 150 mg every other day with 75 mg every other day but having a lot of side effects on 250 daily.  Will have her switch to Effexor 75 mg + Effexor 37.5 mg daily Not taking Buspar Continue rare use of Xanax- last filled 12/24/22 #30 refilled today Continue to encourage therapy -  Stress reduction strategies    Mixed hyperlipidemia Working on lifestyle modification Continue low cholesterol diet and exercise.  Defer lipid panel per strong patient preference  Attention deficit hyperactivity disorder (ADHD), predominantly inattentive type On adderall 5 mg as needed  Current severe episode of major depressive disorder without psychotic features, unspecified whether recurrent (HCC) Will start setting short term goals that are concrete- first is to get passport renewed.  Currently taking Effexor 150 mg every other day with 75 mg every other day but having a lot of side effects on 250 daily.  Will have her switch to Effexor 75 mg + Effexor 37.5 mg daily Not taking Buspar Continue rare use of Xanax- last filled 12/24/22 #30 refilled today Continue to encourage therapy -  Stress reduction strategies   Binge eating Currently taking Adderall 5 mg for ADD and binge eating    Continue diet and meds as discussed. Further disposition pending results of labs. Discussed med's effects and SE's.   Over 30 minutes of exam, counseling, chart review, and  critical decision making was performed.   Future Appointments  Date Time Provider Department Center  08/11/2023  2:00 PM Raynelle Dick, NP [facility] None    ----------------------------------------------------------------------------------------------------------------------  HPI 67 y.o. female  presents for 3 month follow up on hypertension, cholesterol, mood, weight and vitamin D deficiency.     When she takes Effexor 150 mg she will get head zaps occasionally and gets jittery. She feels despondent and does not like the medication. She is not interested in going to therapy. She has limited joy but is isolating herself.  She has gotten a cat and it does provide her joy. Mom is in facility in memory care unit and is very stressful for her.   She is having persistent anxiety   Endorsed non-restorative sleep, daytime fatigue, husband had noted snoring and she was referred to Dr. Vickey Huger to r/o OSA, pending home sleep study.   BMI is Body mass index is 30.52 kg/m., she admits to struggling with night time eating, some mild binge type behaviors, adderall  She is exercising and watching her diet Wt Readings from Last 3 Encounters:  02/11/23 180 lb 9.6 oz (81.9 kg)  12/05/22 184 lb 6.4 oz (83.6 kg)  10/10/22 181 lb 12.8 oz (82.5 kg)     Today their BP is BP: 130/82  Take 1/2 of bisoprolol 10 in AM and olmesartan 40 mg QD at night BP Readings from Last 3 Encounters:  02/11/23 130/82  12/05/22 118/80  10/10/22 (!) 160/98  She denies chest pain, shortness of breath, dizziness, HA.  She  is not on cholesterol medication denies myalgias. Her cholesterol is not at goal. The cholesterol last visit was:   Lab Results  Component Value Date   CHOL 198 08/07/2022   HDL 86 08/07/2022   LDLCALC 88 08/07/2022   TRIG 140 08/07/2022   CHOLHDL 2.3 08/07/2022   Patient is on Vitamin D supplement.   Lab Results  Component Value Date   VD25OH 38 08/07/2022     Hx of elevated hgb, was  referred to hematology, Dr. Thornton Papas ordered some and labs from 08/03/2021 showed normalized, OV     Latest Ref Rng & Units 08/07/2022    2:50 PM 08/03/2021    1:16 PM 07/03/2021    3:33 PM  CBC  WBC 3.8 - 10.8 Thousand/uL 8.8  7.6  9.0   Hemoglobin 11.7 - 15.5 g/dL 45.4  09.8  11.9   Hematocrit 35.0 - 45.0 % 46.7  42.9  47.1   Platelets 140 - 400 Thousand/uL 345  322  329      Current Medications:  Current Outpatient Medications on File Prior to Visit  Medication Sig   ALPRAZolam (XANAX) 0.5 MG tablet Take 1 tablet (0.5 mg total) by mouth at bedtime.   AMBULATORY NON FORMULARY MEDICATION Nifedipine 3% pentoxifyline 5% cream   amphetamine-dextroamphetamine (ADDERALL) 5 MG tablet Take  1/2 to 1 tablet   2 x /day   for ADD, Focus & Concentration   bisoprolol (ZEBETA) 10 MG tablet Take  1/2 to 1 tablet  every Morning for BP   cholecalciferol (VITAMIN D) 1000 UNITS tablet Take 2,000 Units by mouth daily. 2000 units daily   estradiol (ESTRACE) 0.5 MG tablet TAKE 1/2 TABLET BY MOUTH 3 TIMES A WEEK   olmesartan (BENICAR) 40 MG tablet Take 1/2 to 1 tablet at night  for BP   Red Yeast Rice 600 MG CAPS PRN   tiZANidine (ZANAFLEX) 2 MG tablet Take 1 tablet (2 mg total) by mouth every 8 (eight) hours as needed for muscle spasms.   venlafaxine XR (EFFEXOR-XR) 150 MG 24 hr capsule Take 1 capsule (150 mg total) by mouth daily with breakfast.   No current facility-administered medications on file prior to visit.     Allergies:  Allergies  Allergen Reactions   Doxycycline Hives     Medical History:  Past Medical History:  Diagnosis Date   Adenomatous endometrial hyperplasia    age 77 - hysterectomy    Anxiety    Arthritis    Bundle, branch block, left    Cataract    "beginnings of" per pt   Depression    Hyperlipidemia    no per pt- no meds taken   Hypertension    Melanoma 2012   chest   Osteopenia    Family history- Reviewed and unchanged Social history- Reviewed and  unchanged   Review of Systems:  Review of Systems  Constitutional:  Negative for malaise/fatigue and weight loss.  HENT:  Negative for hearing loss and tinnitus.   Eyes:  Negative for blurred vision and double vision.  Respiratory:  Negative for cough, shortness of breath and wheezing.   Cardiovascular:  Negative for chest pain, palpitations, orthopnea, claudication and leg swelling.  Gastrointestinal:  Negative for abdominal pain, blood in stool, constipation, diarrhea, heartburn, melena, nausea and vomiting.  Genitourinary: Negative.   Musculoskeletal:  Positive for back pain (mid back pain, 3 days). Negative for joint pain and myalgias.  Skin:  Negative for rash.  Neurological:  Negative for  dizziness, tingling, sensory change, weakness and headaches.  Endo/Heme/Allergies:  Negative for polydipsia.  Psychiatric/Behavioral:  Positive for depression. Negative for memory loss, substance abuse and suicidal ideas. The patient is nervous/anxious. The patient does not have insomnia.   All other systems reviewed and are negative.     Physical Exam: BP 130/82   Pulse 66   Temp (!) 97.5 F (36.4 C)   Ht 5' 4.5" (1.638 m)   Wt 180 lb 9.6 oz (81.9 kg)   SpO2 97%   BMI 30.52 kg/m  Wt Readings from Last 3 Encounters:  02/11/23 180 lb 9.6 oz (81.9 kg)  12/05/22 184 lb 6.4 oz (83.6 kg)  10/10/22 181 lb 12.8 oz (82.5 kg)   General Appearance: Well nourished, appears anxious Eyes: PERRLA, conjunctiva no swelling or erythema ENT/Mouth: mask in place; Hearing normal.  Neck: Supple, thyroid normal.  Respiratory: Respiratory effort normal, BS equal bilaterally without rales, rhonchi, wheezing or stridor.  Cardio: RRR with no MRGs. Brisk peripheral pulses without edema.  Lymphatics: Non tender without lymphadenopathy.  Musculoskeletal: no obvious deformity; normal non-antalgic gait. No mid line spinous tenderness, full ROM intact, mild muscle tenderness without spasm to mid back paraspinals.   Skin: Warm, dry without rashes, lesions, ecchymosis.  Neuro: Normal muscle tone Psych: Awake and oriented X 3, anxious affect, Insight and Judgment appropriate.   Raynelle Dick, NP 2:49 PM [facility]

## 2023-02-12 LAB — CBC WITH DIFFERENTIAL/PLATELET
Absolute Monocytes: 731 cells/uL (ref 200–950)
Basophils Absolute: 51 cells/uL (ref 0–200)
Basophils Relative: 0.6 %
Eosinophils Absolute: 238 cells/uL (ref 15–500)
Eosinophils Relative: 2.8 %
HCT: 46.4 % — ABNORMAL HIGH (ref 35.0–45.0)
Hemoglobin: 15.7 g/dL — ABNORMAL HIGH (ref 11.7–15.5)
Lymphs Abs: 1734 cells/uL (ref 850–3900)
MCH: 31 pg (ref 27.0–33.0)
MCHC: 33.8 g/dL (ref 32.0–36.0)
MCV: 91.5 fL (ref 80.0–100.0)
MPV: 9.6 fL (ref 7.5–12.5)
Monocytes Relative: 8.6 %
Neutro Abs: 5746 cells/uL (ref 1500–7800)
Neutrophils Relative %: 67.6 %
Platelets: 338 10*3/uL (ref 140–400)
RBC: 5.07 10*6/uL (ref 3.80–5.10)
RDW: 11.8 % (ref 11.0–15.0)
Total Lymphocyte: 20.4 %
WBC: 8.5 10*3/uL (ref 3.8–10.8)

## 2023-02-12 LAB — COMPLETE METABOLIC PANEL WITH GFR
AG Ratio: 2 (calc) (ref 1.0–2.5)
ALT: 34 U/L — ABNORMAL HIGH (ref 6–29)
AST: 20 U/L (ref 10–35)
Albumin: 4.3 g/dL (ref 3.6–5.1)
Alkaline phosphatase (APISO): 77 U/L (ref 37–153)
BUN: 13 mg/dL (ref 7–25)
CO2: 28 mmol/L (ref 20–32)
Calcium: 9.7 mg/dL (ref 8.6–10.4)
Chloride: 101 mmol/L (ref 98–110)
Creat: 0.67 mg/dL (ref 0.50–1.05)
Globulin: 2.2 g/dL (calc) (ref 1.9–3.7)
Glucose, Bld: 91 mg/dL (ref 65–99)
Potassium: 4.5 mmol/L (ref 3.5–5.3)
Sodium: 137 mmol/L (ref 135–146)
Total Bilirubin: 0.3 mg/dL (ref 0.2–1.2)
Total Protein: 6.5 g/dL (ref 6.1–8.1)
eGFR: 96 mL/min/{1.73_m2} (ref >=60)

## 2023-02-12 LAB — LIPID PANEL
Cholesterol: 222 mg/dL — ABNORMAL HIGH (ref <200)
HDL: 81 mg/dL (ref >=50)
LDL Cholesterol (Calc): 113 mg/dL (calc) — ABNORMAL HIGH
Non-HDL Cholesterol (Calc): 141 mg/dL (calc) — ABNORMAL HIGH (ref <130)
Total CHOL/HDL Ratio: 2.7 (calc) (ref <5.0)
Triglycerides: 167 mg/dL — ABNORMAL HIGH (ref <150)

## 2023-03-11 NOTE — Progress Notes (Unsigned)
Assessment and Plan:  There are no diagnoses linked to this encounter.    Further disposition pending results of labs. Discussed med's effects and SE's.   Over 30 minutes of exam, counseling, chart review, and critical decision making was performed.   Future Appointments  Date Time Provider Department Center  03/12/2023  2:00 PM Raynelle Dick, NP [facility] None  08/11/2023  2:00 PM Raynelle Dick, NP [facility] None    ------------------------------------------------------------------------------------------------------------------   HPI There were no vitals taken for this visit. 67 y.o.female presents for  Past Medical History:  Diagnosis Date   Adenomatous endometrial hyperplasia    age 60 - hysterectomy    Anxiety    Arthritis    Bundle, branch block, left    Cataract    "beginnings of" per pt   Depression    Hyperlipidemia    no per pt- no meds taken   Hypertension    Melanoma (HCC) 2012   chest   Osteopenia      Allergies  Allergen Reactions   Doxycycline Hives    Current Outpatient Medications on File Prior to Visit  Medication Sig   ALPRAZolam (XANAX) 0.5 MG tablet Take 1 tablet (0.5 mg total) by mouth at bedtime.   AMBULATORY NON FORMULARY MEDICATION Nifedipine 3% pentoxifyline 5% cream   amphetamine-dextroamphetamine (ADDERALL) 5 MG tablet Take  1/2 to 1 tablet   2 x /day   for ADD, Focus & Concentration   bisoprolol (ZEBETA) 10 MG tablet Take  1/2 to 1 tablet  every Morning for BP   cholecalciferol (VITAMIN D) 1000 UNITS tablet Take 2,000 Units by mouth daily. 2000 units daily   estradiol (ESTRACE) 0.5 MG tablet TAKE 1/2 TABLET BY MOUTH 3 TIMES A WEEK   olmesartan (BENICAR) 40 MG tablet Take 1/2 to 1 tablet at night  for BP   Red Yeast Rice 600 MG CAPS PRN   tiZANidine (ZANAFLEX) 2 MG tablet Take 1 tablet (2 mg total) by mouth every 8 (eight) hours as needed for muscle spasms.   venlafaxine XR (EFFEXOR XR) 37.5 MG 24 hr capsule Take 1 capsule  (37.5 mg total) by mouth daily.   venlafaxine XR (EFFEXOR XR) 75 MG 24 hr capsule Take 1 capsule (75 mg total) by mouth daily.   No current facility-administered medications on file prior to visit.    ROS: all negative except above.   Physical Exam:  There were no vitals taken for this visit.  General Appearance: Well nourished, in no apparent distress. Eyes: PERRLA, EOMs, conjunctiva no swelling or erythema Sinuses: No Frontal/maxillary tenderness ENT/Mouth: Ext aud canals clear, TMs without erythema, bulging. No erythema, swelling, or exudate on post pharynx.  Tonsils not swollen or erythematous. Hearing normal.  Neck: Supple, thyroid normal.  Respiratory: Respiratory effort normal, BS equal bilaterally without rales, rhonchi, wheezing or stridor.  Cardio: RRR with no MRGs. Brisk peripheral pulses without edema.  Abdomen: Soft, + BS.  Non tender, no guarding, rebound, hernias, masses. Lymphatics: Non tender without lymphadenopathy.  Musculoskeletal: Full ROM, 5/5 strength, normal gait.  Skin: Warm, dry without rashes, lesions, ecchymosis.  Neuro: Cranial nerves intact. Normal muscle tone, no cerebellar symptoms. Sensation intact.  Psych: Awake and oriented X 3, normal affect, Insight and Judgment appropriate.     Raynelle Dick, NP 1:44 PM [facility]

## 2023-03-12 ENCOUNTER — Encounter: Payer: Self-pay | Admitting: Nurse Practitioner

## 2023-03-12 ENCOUNTER — Ambulatory Visit (INDEPENDENT_AMBULATORY_CARE_PROVIDER_SITE_OTHER): Payer: Medicare Other | Admitting: Nurse Practitioner

## 2023-03-12 VITALS — BP 138/86 | HR 68 | Temp 97.5°F | Ht 64.5 in | Wt 185.6 lb

## 2023-03-12 DIAGNOSIS — I10 Essential (primary) hypertension: Secondary | ICD-10-CM

## 2023-03-12 DIAGNOSIS — F322 Major depressive disorder, single episode, severe without psychotic features: Secondary | ICD-10-CM

## 2023-03-12 DIAGNOSIS — E65 Localized adiposity: Secondary | ICD-10-CM

## 2023-04-04 ENCOUNTER — Other Ambulatory Visit: Payer: Self-pay | Admitting: Nurse Practitioner

## 2023-04-04 ENCOUNTER — Encounter: Payer: Self-pay | Admitting: Nurse Practitioner

## 2023-04-06 ENCOUNTER — Other Ambulatory Visit: Payer: Self-pay | Admitting: Nurse Practitioner

## 2023-04-06 DIAGNOSIS — E669 Obesity, unspecified: Secondary | ICD-10-CM

## 2023-04-06 MED ORDER — ZEPBOUND 2.5 MG/0.5ML ~~LOC~~ SOAJ: 2.5000 mg | SUBCUTANEOUS | 2 refills | Status: DC

## 2023-04-11 ENCOUNTER — Other Ambulatory Visit: Payer: Self-pay | Admitting: Nurse Practitioner

## 2023-04-14 ENCOUNTER — Other Ambulatory Visit: Payer: Self-pay | Admitting: Nurse Practitioner

## 2023-04-14 DIAGNOSIS — E669 Obesity, unspecified: Secondary | ICD-10-CM

## 2023-04-14 MED ORDER — WEGOVY 0.5 MG/0.5ML ~~LOC~~ SOAJ: 0.5000 mg | SUBCUTANEOUS | 3 refills | Status: DC

## 2023-04-14 NOTE — Telephone Encounter (Signed)
Can you please address this. I have gone back and forth with her multiple times

## 2023-04-16 ENCOUNTER — Other Ambulatory Visit: Payer: Self-pay

## 2023-04-16 ENCOUNTER — Telehealth: Payer: Self-pay

## 2023-04-16 NOTE — Telephone Encounter (Signed)
Prior auth completed and submitted. 

## 2023-04-17 NOTE — Telephone Encounter (Signed)
Prior auth denied:  Message from plan: Denied. We denied this request under Medicare Part D because the Social Security Act permits the exclusion of certain drugs or classes of drugs from coverage under Part D. Reginal Lutes is a drug used for weight loss, which is one of the excluded classes of drugs listed in the Social Security Act. Because Reginal Lutes is excluded from coverage under this Act, as well as listed as an exclusion in your Evidence of Coverage document, we are denying your request.

## 2023-05-10 ENCOUNTER — Other Ambulatory Visit: Payer: Self-pay | Admitting: Nurse Practitioner

## 2023-05-10 DIAGNOSIS — F322 Major depressive disorder, single episode, severe without psychotic features: Secondary | ICD-10-CM

## 2023-05-10 DIAGNOSIS — F411 Generalized anxiety disorder: Secondary | ICD-10-CM

## 2023-05-15 ENCOUNTER — Encounter: Payer: Self-pay | Admitting: Nurse Practitioner

## 2023-05-16 ENCOUNTER — Encounter: Payer: Self-pay | Admitting: Nurse Practitioner

## 2023-05-16 NOTE — Telephone Encounter (Signed)
Can you make this into a letter I can sign

## 2023-06-03 ENCOUNTER — Encounter: Payer: Self-pay | Admitting: Nurse Practitioner

## 2023-06-03 NOTE — Telephone Encounter (Signed)
Please advise pt to make appointment to discuss medication

## 2023-06-04 ENCOUNTER — Other Ambulatory Visit: Payer: Self-pay | Admitting: Nurse Practitioner

## 2023-06-04 DIAGNOSIS — E782 Mixed hyperlipidemia: Secondary | ICD-10-CM

## 2023-06-04 DIAGNOSIS — I10 Essential (primary) hypertension: Secondary | ICD-10-CM

## 2023-06-04 DIAGNOSIS — E669 Obesity, unspecified: Secondary | ICD-10-CM

## 2023-06-04 MED ORDER — ZEPBOUND 5 MG/0.5ML ~~LOC~~ SOAJ
5.0000 mg | SUBCUTANEOUS | 2 refills | Status: DC
Start: 2023-06-04 — End: 2023-06-30

## 2023-06-04 NOTE — Telephone Encounter (Signed)
If you want to print the letter and send for her that's fine- I sent script to pharmacy

## 2023-06-10 NOTE — Telephone Encounter (Signed)
Please let her know.

## 2023-06-13 ENCOUNTER — Telehealth: Payer: Self-pay

## 2023-06-13 NOTE — Telephone Encounter (Signed)
Please see if this can be approved

## 2023-06-13 NOTE — Telephone Encounter (Signed)
Prior auth completed and submitted. 

## 2023-06-17 NOTE — Telephone Encounter (Signed)
Prior auth denied. 

## 2023-06-30 ENCOUNTER — Encounter: Payer: Self-pay | Admitting: Nurse Practitioner

## 2023-06-30 ENCOUNTER — Other Ambulatory Visit: Payer: Self-pay

## 2023-06-30 DIAGNOSIS — E669 Obesity, unspecified: Secondary | ICD-10-CM

## 2023-06-30 MED ORDER — TIRZEPATIDE-WEIGHT MANAGEMENT 5 MG/0.5ML ~~LOC~~ SOLN
5.0000 mg | SUBCUTANEOUS | 1 refills | Status: DC
Start: 2023-06-30 — End: 2023-06-30

## 2023-06-30 MED ORDER — TIRZEPATIDE-WEIGHT MANAGEMENT 5 MG/0.5ML ~~LOC~~ SOLN
5.0000 mg | SUBCUTANEOUS | 1 refills | Status: DC
Start: 2023-06-30 — End: 2023-07-01

## 2023-06-30 NOTE — Telephone Encounter (Signed)
Please send whichever pharmacy she wants but let her know how expensive it will be.

## 2023-07-01 ENCOUNTER — Other Ambulatory Visit: Payer: Self-pay

## 2023-07-01 DIAGNOSIS — E669 Obesity, unspecified: Secondary | ICD-10-CM

## 2023-07-01 MED ORDER — ZEPBOUND 5 MG/0.5ML ~~LOC~~ SOAJ: 5.0000 mg | SUBCUTANEOUS | 1 refills | Status: DC

## 2023-07-01 MED ORDER — ZEPBOUND 5 MG/0.5ML ~~LOC~~ SOAJ
5.0000 mg | SUBCUTANEOUS | 1 refills | Status: DC
Start: 2023-07-01 — End: 2023-07-01

## 2023-07-01 MED ORDER — ZEPBOUND 5 MG/0.5ML ~~LOC~~ SOLN
5.0000 mg | SUBCUTANEOUS | 1 refills | Status: DC
Start: 2023-07-01 — End: 2023-07-01

## 2023-07-01 MED ORDER — TIRZEPATIDE-WEIGHT MANAGEMENT 5 MG/0.5ML ~~LOC~~ SOLN
5.0000 mg | SUBCUTANEOUS | 1 refills | Status: DC
Start: 2023-07-01 — End: 2023-07-01

## 2023-07-01 NOTE — Addendum Note (Signed)
Addended by: Adria Dill on: 07/01/2023 05:04 PM   Modules accepted: Orders

## 2023-07-01 NOTE — Addendum Note (Signed)
Addended by: Adria Dill on: 07/01/2023 04:45 PM   Modules accepted: Orders

## 2023-07-02 ENCOUNTER — Other Ambulatory Visit: Payer: Self-pay | Admitting: Nurse Practitioner

## 2023-07-02 DIAGNOSIS — Z6831 Body mass index (BMI) 31.0-31.9, adult: Secondary | ICD-10-CM

## 2023-07-02 DIAGNOSIS — E669 Obesity, unspecified: Secondary | ICD-10-CM

## 2023-07-02 MED ORDER — ZEPBOUND 5 MG/0.5ML ~~LOC~~ SOLN
5.0000 mg | SUBCUTANEOUS | 2 refills | Status: DC
Start: 2023-07-02 — End: 2023-09-12

## 2023-07-02 MED ORDER — ZEPBOUND 5 MG/0.5ML ~~LOC~~ SOLN
5.0000 mg | SUBCUTANEOUS | 2 refills | Status: DC
Start: 2023-07-02 — End: 2023-07-02

## 2023-07-09 ENCOUNTER — Other Ambulatory Visit: Payer: Self-pay | Admitting: Nurse Practitioner

## 2023-07-09 ENCOUNTER — Telehealth: Payer: Self-pay

## 2023-07-09 DIAGNOSIS — F411 Generalized anxiety disorder: Secondary | ICD-10-CM

## 2023-07-09 MED ORDER — ALPRAZOLAM 0.5 MG PO TABS
0.5000 mg | ORAL_TABLET | Freq: Every day | ORAL | 0 refills | Status: DC
Start: 2023-07-09 — End: 2023-11-21

## 2023-07-09 NOTE — Telephone Encounter (Signed)
Patient requestion a refill on Xanax.

## 2023-08-04 NOTE — Telephone Encounter (Signed)
error 

## 2023-08-07 ENCOUNTER — Encounter: Payer: Medicare Other | Admitting: Internal Medicine

## 2023-08-11 ENCOUNTER — Encounter: Payer: Medicare Other | Admitting: Nurse Practitioner

## 2023-08-18 ENCOUNTER — Other Ambulatory Visit: Payer: Self-pay | Admitting: Nurse Practitioner

## 2023-08-18 DIAGNOSIS — F411 Generalized anxiety disorder: Secondary | ICD-10-CM

## 2023-08-18 DIAGNOSIS — F322 Major depressive disorder, single episode, severe without psychotic features: Secondary | ICD-10-CM

## 2023-08-27 NOTE — Progress Notes (Deleted)
Complete Physical  Assessment and Plan: Maureen Henderson was seen today for annual exam.  Diagnoses and all orders for this visit:  Encounter for general adult medical examination with abnormal findings       - Due annually  Essential hypertension -     CBC with Differential/Platelet -     EKG 12-Lead - Advised she is to take Losartan 100 mg 1 tab daily- do not take 1/2 dose as BP is running high. Continue DASH diet, exercise and monitor at home. Call if greater than 130/80.    LBBB (left bundle branch block) -     EKG 12-Lead - Continue to monitor  Menopausal State       - Continue Estradiol 0.5mg  to 1/2 tab three times a week with eventual goal to d/c medication  Osteopenia, unspecified location       - DEXA 06/28/21 showed improvement of osteopenia  Anxiety state -    Continue Effexor and Buspar- anxiety is not controlled will give a small amount of Xanax that she is to use sparingly - Stressed importance of diet, exercise and therapy.    Malignant melanoma of torso excluding breast (HCC)       -  Continue to follow with dermatology, Dr Emily Filbert  Mixed hyperlipidemia -     COMPLETE METABOLIC PANEL WITH GFR -     Lipid panel       - Stressed importance of diet and exercise   ADD Continue medication- Ritalin Practice diet, exercise and good sleep hygiene  Medication management -     CBC with Differential/Platelet -     COMPLETE METABOLIC PANEL WITH GFR -     Lipid panel -     TSH -     Hemoglobin A1c -     VITAMIN D 25 Hydroxy (Vit-D Deficiency, Fractures) -     Magnesium -     EKG 12-Lead -     Urinalysis, Routine w reflex microscopic -     Microalbumin / creatinine urine ratio  Vitamin D deficiency Continue Vit D supplementation to maintain value in therapeutic level of 60-100  -     VITAMIN D 25 Hydroxy (Vit-D Deficiency, Fractures)    Obesity BMI 30 Long discussion about weight loss, diet, and exercise Fair life protein shakes Eat more frequently - try not to go more  than 6 hours without protein Aim for 90 grams of protein a day- 30 breakfast/30 lunch 30 dinner Try to keep net carbs less than 50 Net Carbs=Total Carbs-fiber- sugar alcohols Exercise heartrate 120-140(fat burning zone)- walking 20-30 minutes 4 days a week  Follow up at next visit   Thyroid nodule - U/S of thyroid    Discussed med's effects and SE's. Screening labs and tests as requested with regular follow-up as recommended. Over 40 minutes of exam, counseling, chart review, and complex, high level critical decision making was performed this visit.   HPI  67 y.o. female  presents for a complete physical and follow up for has Essential hypertension; Anxiety state; Melanoma (HCC); Mixed hyperlipidemia; Osteopenia; LBBB (left bundle branch block); Attention deficit disorder (ADD); Elevated hemoglobin (HCC); Caregiver stress syndrome; Other fatigue; Erythrocytosis; Actinic skin damage; Inflamed seborrheic keratosis; Seborrheic keratoses; Melanocytic nevi of trunk; Rosacea; and Subjective tinnitus of both ears on their problem list..  Her mom has dementia, is a stressor for her, will go to visit frequently and stressed with this.  Had history of ADD and is taking medication irregularly.   She  has chronic fatigue, seems like she is having more problems with energy.She does take care of mother with dementia but she lives [address] a way.  Someone does sit with her during the week. She is working to help with homeless.   She has more insomnia and anxiety. She continues to be very stressed and does not feel like her mood is controlled. She is on Ritalin for ADD has not been taking consistently. Buspar is not helping her anxiety, she is not doing well off of Alprazolam.    Her blood pressure has been controlled at home currently on Losartan 100 mg qd- she has been taking 1/2- 1 tab daily, today their BP is  . White coat hypertension, anxiety with having labs drawn.  BP this morning 129/87. Encouraged  to take 1 tab daily of Losartan. BP Readings from Last 3 Encounters:  03/12/23 138/86  02/11/23 130/82  12/05/22 118/80    She does not workout.. She denies chest pain, shortness of breath, dizziness.  LBBB X 2003,  Normal stress test at that time.   She is not on cholesterol medication and denies myalgias. Her cholesterol is at goal. She has been limiting her red meat, dairy.  Taking red yeast rice daily. The cholesterol last visit was:   Lab Results  Component Value Date   CHOL 222 (H) 02/11/2023   HDL 81 02/11/2023   LDLCALC 113 (H) 02/11/2023   TRIG 167 (H) 02/11/2023   CHOLHDL 2.7 02/11/2023   Last W4X in the office was:  Lab Results  Component Value Date   HGBA1C 5.4 08/07/2022   Last GFR: Lab Results  Component Value Date   EGFR 96 02/11/2023    Patient is on Vitamin D supplement 500 units daily Lab Results  Component Value Date   VD25OH 38 08/07/2022     BMI is There is no height or weight on file to calculate BMI., she is interested in weight loss. She has been limiting her saturated fats and simple carbs. She is not getting much exercise.  Wt Readings from Last 3 Encounters:  03/12/23 185 lb 9.6 oz (84.2 kg)  02/11/23 180 lb 9.6 oz (81.9 kg)  12/05/22 184 lb 6.4 oz (83.6 kg)    Current Medications:  Current Outpatient Medications on File Prior to Visit  Medication Sig Dispense Refill   ALPRAZolam (XANAX) 0.5 MG tablet Take 1 tablet (0.5 mg total) by mouth at bedtime. Only as needed and try to limit to 5 days a week 25 tablet 0   AMBULATORY NON FORMULARY MEDICATION Nifedipine 3% pentoxifyline 5% cream 60 g PRN   amphetamine-dextroamphetamine (ADDERALL) 5 MG tablet Take  1/2 to 1 tablet   2 x /day   for ADD, Focus & Concentration 180 tablet 0   bisoprolol (ZEBETA) 10 MG tablet Take  1/2 to 1 tablet  every Morning for BP 90 tablet 3   cholecalciferol (VITAMIN D) 1000 UNITS tablet Take 2,000 Units by mouth daily. 2000 units daily     estradiol (ESTRACE) 0.5 MG  tablet TAKE 1/2 TABLET BY MOUTH 3 TIMES A WEEK 90 tablet 3   olmesartan (BENICAR) 40 MG tablet Take 1/2 to 1 tablet at night  for BP 90 tablet 3   Red Yeast Rice 600 MG CAPS PRN     Tirzepatide-Weight Management (ZEPBOUND) 5 MG/0.5ML SOLN Inject 5 mg into the skin once a week. 2 mL 2   tiZANidine (ZANAFLEX) 2 MG tablet Take 1 tablet (2 mg total)  by mouth every 8 (eight) hours as needed for muscle spasms. 30 tablet 0   venlafaxine XR (EFFEXOR-XR) 37.5 MG 24 hr capsule TAKE 1 CAPSULE(37.5 MG) BY MOUTH DAILY 30 capsule 2   venlafaxine XR (EFFEXOR-XR) 75 MG 24 hr capsule TAKE 1 CAPSULE(75 MG) BY MOUTH DAILY 30 capsule 2   No current facility-administered medications on file prior to visit.   Allergies:  Allergies  Allergen Reactions   Doxycycline Hives   Medical History:  She has Essential hypertension; Anxiety state; Melanoma (HCC); Mixed hyperlipidemia; Osteopenia; LBBB (left bundle branch block); Attention deficit disorder (ADD); Elevated hemoglobin (HCC); Caregiver stress syndrome; Other fatigue; Erythrocytosis; Actinic skin damage; Inflamed seborrheic keratosis; Seborrheic keratoses; Melanocytic nevi of trunk; Rosacea; and Subjective tinnitus of both ears on their problem list. Health Maintenance:   Immunization History  Administered Date(s) Administered   Influenza Inj Mdck Quad With Preservative 09/18/2017, 09/03/2018   Influenza-Unspecified 08/05/2013, 09/18/2014, 11/17/2016   Janssen (J&J) SARS-COV-2 Vaccination 12/30/2019, 12/30/2019   Moderna SARS-COV2 Booster Vaccination 10/06/2020, 10/06/2020   PPD Test 09/30/2014   Pneumococcal Polysaccharide-23 06/05/2021   Td 08/30/2005   Zoster, Live 12/18/2016    Health Maintenance  Topic Date Due   Hepatitis C Screening  Never done   Zoster Vaccines- Shingrix (1 of 2) 02/11/1975   DTaP/Tdap/Td (2 - Tdap) 08/31/2015   Colonoscopy  08/10/2021   Pneumonia Vaccine 69+ Years old (2 of 2 - PCV) 06/05/2022   Medicare Annual Wellness (AWV)   07/03/2022   INFLUENZA VACCINE  05/22/2023   COVID-19 Vaccine (3 - 2023-24 season) 06/22/2023   MAMMOGRAM  09/11/2024   DEXA SCAN  Completed   HPV VACCINES  Aged Out     Patient Care Team: Lucky Cowboy, MD as PCP - General (Internal Medicine) Maxie Better, MD as Consulting Physician (Obstetrics and Gynecology) Yates Decamp, MD as Consulting Physician (Cardiology) Hart Carwin, MD (Inactive) as Consulting Physician (Gastroenterology) Elmon Else, MD as Consulting Physician (Dermatology) Dorisann Frames, MD as Consulting Physician (Endocrinology)  Surgical History:  She has a past surgical history that includes Partial hysterectomy (30); Tonsillectomy; Melanoma excision; Colonoscopy; and Polypectomy. Family History:  Herfamily history includes Breast cancer in her sister; Cancer in her father, maternal grandmother, and paternal grandfather; Colon cancer in her maternal aunt; Colon polyps in her sister; Heart disease in her paternal grandmother; Hypertension in her mother; Prostate cancer in her father; Stroke in her paternal grandmother. Social History:  She reports that she has never smoked. She has never used smokeless tobacco. She reports current alcohol use of about 7.0 standard drinks of alcohol per week. She reports that she does not use drugs.  Review of Systems: Review of Systems  Constitutional:  Positive for malaise/fatigue. Negative for chills, diaphoresis, fever and weight loss.       Weight gain  HENT:  Positive for hearing loss and tinnitus. Negative for congestion, ear discharge, ear pain, nosebleeds, sinus pain and sore throat.   Eyes: Negative.  Negative for blurred vision and double vision.  Respiratory: Negative.  Negative for cough, shortness of breath and stridor.   Cardiovascular: Negative.  Negative for chest pain, palpitations, orthopnea and leg swelling.  Gastrointestinal:  Negative for abdominal pain, blood in stool, constipation, diarrhea,  heartburn, melena, nausea and vomiting.  Genitourinary: Negative.   Musculoskeletal:  Negative for back pain, falls, joint pain, myalgias and neck pain.  Skin: Negative.  Negative for rash.  Neurological:  Negative for dizziness, tingling, tremors, loss of consciousness, weakness and headaches.  Endo/Heme/Allergies: Negative.   Psychiatric/Behavioral:  Negative for depression, memory loss and suicidal ideas. The patient is nervous/anxious and has insomnia.     Physical Exam: Estimated body mass index is 31.37 kg/m as calculated from the following:   Height as of 03/12/23: 5' 4.5" (1.638 m).   Weight as of 03/12/23: 185 lb 9.6 oz (84.2 kg). There were no vitals taken for this visit. General Appearance: Well nourished, in no apparent distress.  Eyes: PERRLA, EOMs, conjunctiva no swelling or erythema, normal fundi and vessels.  Sinuses: No Frontal/maxillary tenderness  ENT/Mouth: Ext aud canals clear, normal light reflex with TMs without erythema + bilateral effusions without erythema, pus, bulging of TM. Good dentition. No erythema, swelling, or exudate on post pharynx. Tonsils not swollen or erythematous. Hearing decreased Neck: Supple, nodule right thyroid Respiratory: Respiratory effort normal, BS equal bilaterally without rales, rhonchi, wheezing or stridor.  Cardio: RRR without murmurs, rubs or gallops. Brisk peripheral pulses without edema.  Chest: symmetric, with normal excursions and percussion.  Breasts: defer Abdomen: Soft, nontender, no guarding, rebound, hernias, masses, or organomegaly.  Lymphatics: Non tender without lymphadenopathy.  Genitourinary: defer Musculoskeletal: Full ROM all peripheral extremities,5/5 strength, and normal gait.  Skin: bilateral toes with some dryness, purplish color and no visible ulcers. Good cap refill.  Warm, dry without rashes, lesions, ecchymosis. Neuro: Cranial nerves intact, reflexes equal bilaterally. Normal muscle tone, no cerebellar  symptoms. Sensation decreased Psych: Awake and oriented X 3, normal affect, Insight and Judgment appropriate.   EKG: NSR, LBBB   Sharel Behne E  12:50 PM [facility]

## 2023-08-28 ENCOUNTER — Encounter: Payer: Medicare Other | Admitting: Nurse Practitioner

## 2023-09-03 DIAGNOSIS — Z1231 Encounter for screening mammogram for malignant neoplasm of breast: Secondary | ICD-10-CM | POA: Diagnosis not present

## 2023-09-03 LAB — HM MAMMOGRAPHY

## 2023-09-10 ENCOUNTER — Encounter: Payer: Self-pay | Admitting: Internal Medicine

## 2023-09-12 ENCOUNTER — Other Ambulatory Visit: Payer: Self-pay | Admitting: Nurse Practitioner

## 2023-09-12 DIAGNOSIS — E669 Obesity, unspecified: Secondary | ICD-10-CM

## 2023-09-12 DIAGNOSIS — Z6831 Body mass index (BMI) 31.0-31.9, adult: Secondary | ICD-10-CM

## 2023-10-16 ENCOUNTER — Other Ambulatory Visit: Payer: Self-pay

## 2023-10-16 DIAGNOSIS — I10 Essential (primary) hypertension: Secondary | ICD-10-CM

## 2023-10-16 MED ORDER — OLMESARTAN MEDOXOMIL 40 MG PO TABS: ORAL_TABLET | ORAL | 3 refills | Status: DC

## 2023-10-16 MED ORDER — BISOPROLOL FUMARATE 10 MG PO TABS: ORAL_TABLET | ORAL | 3 refills | Status: DC

## 2023-11-14 ENCOUNTER — Other Ambulatory Visit: Payer: Self-pay | Admitting: Nurse Practitioner

## 2023-11-14 DIAGNOSIS — F411 Generalized anxiety disorder: Secondary | ICD-10-CM

## 2023-11-14 DIAGNOSIS — F322 Major depressive disorder, single episode, severe without psychotic features: Secondary | ICD-10-CM

## 2023-11-21 ENCOUNTER — Ambulatory Visit (INDEPENDENT_AMBULATORY_CARE_PROVIDER_SITE_OTHER): Payer: Medicare Other | Admitting: Nurse Practitioner

## 2023-11-21 VITALS — BP 120/80 | HR 70 | Temp 98.0°F | Resp 16 | Ht 64.5 in | Wt 182.8 lb

## 2023-11-21 DIAGNOSIS — B029 Zoster without complications: Secondary | ICD-10-CM | POA: Diagnosis not present

## 2023-11-21 DIAGNOSIS — F322 Major depressive disorder, single episode, severe without psychotic features: Secondary | ICD-10-CM

## 2023-11-21 DIAGNOSIS — F411 Generalized anxiety disorder: Secondary | ICD-10-CM

## 2023-11-21 DIAGNOSIS — B0089 Other herpesviral infection: Secondary | ICD-10-CM | POA: Diagnosis not present

## 2023-11-21 MED ORDER — VALACYCLOVIR HCL 1 G PO TABS
1000.0000 mg | ORAL_TABLET | Freq: Three times a day (TID) | ORAL | 0 refills | Status: DC
Start: 2023-11-21 — End: 2023-12-05

## 2023-11-21 MED ORDER — ALPRAZOLAM 0.5 MG PO TABS: 0.5000 mg | ORAL_TABLET | Freq: Every day | ORAL | 0 refills | Status: DC

## 2023-11-21 MED ORDER — VENLAFAXINE HCL ER 37.5 MG PO CP24: 37.5000 mg | ORAL_CAPSULE | Freq: Every day | ORAL | 2 refills | Status: DC

## 2023-11-21 NOTE — Progress Notes (Unsigned)
Assessment and Plan:  Maureen Henderson was seen today for an episodic visit.  Diagnoses and all order for this visit:  1. Herpes zoster without complication (Primary)/Herpatic Whitlow Start tmt with antiviral -  Prevent the spread of infection by keeping hands covered Avoid close contact with other people until sores heal. Do not touch the bumps with your hands or pick at the scabs Wash hands with soap and water.   - valACYclovir (VALTREX) 1000 MG tablet; Take 1 tablet (1,000 mg total) by mouth 3 (three) times daily. Treat for 7 days.  Dispense: 30 tablet; Refill: 0  2. Anxiety state Continue medications - effective - refilled.   Reviewed relaxation techniques.  Sleep hygiene. Recommended Cognitive Behavioral Therapy (CBT) PRN. Recommended mindfulness meditation and exercise.   Insight-oriented psychotherapy given for 16 minutes exclusively. Psychoeducation:  encouraged personality growth wand development through coping techniques and problem-solving skills. Limit/Decrease/Monitor drug/alcohol intake.    - ALPRAZolam (XANAX) 0.5 MG tablet; Take 1 tablet (0.5 mg total) by mouth at bedtime. Only as needed and try to limit to 5 days a week  Dispense: 25 tablet; Refill: 0 - venlafaxine XR (EFFEXOR-XR) 37.5 MG 24 hr capsule; Take 1 capsule (37.5 mg total) by mouth daily with breakfast.  Dispense: 30 capsule; Refill: 2  3. Current severe episode of major depressive disorder without psychotic features, unspecified whether recurrent (HCC) Continue Venlafaxine as directed.  - venlafaxine XR (EFFEXOR-XR) 37.5 MG 24 hr capsule; Take 1 capsule (37.5 mg total) by mouth daily with breakfast.  Dispense: 30 capsule; Refill: 2    Continue to monitor for any increase in fever, chills, N/V, diarrhea, changes to bowel habits, blood in stool.  Notify office for further evaluation and treatment, questions or concerns if s/s fail to improve. The risks and benefits of my recommendations, as well as other  treatment options were discussed with the patient today. Questions were answered.  Further disposition pending results of labs. Discussed med's effects and SE's.    Over 20 minutes of exam, counseling, chart review, and critical decision making was performed.   No future appointments.  ------------------------------------------------------------------------------------------------------------------   HPI BP 120/80   Pulse 70   Temp 98 F (36.7 C)   Resp 16   Ht 5' 4.5" (1.638 m)   Wt 182 lb 12.8 oz (82.9 kg)   BMI 30.89 kg/m   68 y.o.female presents for evaluation of fluid filled blisters along the bilateral fingers and hands.  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  Hands are sensitive to touch accompanied by tingling.  She has been covering with gloves.  Denies recent URI, fever, chills.  Does report increase in stress taking care of aging mother.  She is treated with Xanax, PRN, with limited use and Effexor which are currently effective.    Past Medical History:  Diagnosis Date   Adenomatous endometrial hyperplasia    age 68 - hysterectomy    Anxiety    Arthritis    Bundle, branch block, left    Cataract    "beginnings of" per pt   Depression    Hyperlipidemia    no per pt- no meds taken   Hypertension    Melanoma (HCC) 2012   chest   Osteopenia      Allergies  Allergen Reactions   Doxycycline Hives    Current Outpatient Medications on File Prior to Visit  Medication Sig   AMBULATORY NON FORMULARY MEDICATION Nifedipine 3% pentoxifyline 5% cream   amphetamine-dextroamphetamine (ADDERALL) 5 MG tablet Take  1/2 to 1 tablet   2 x /day   for ADD, Focus & Concentration   bisoprolol (ZEBETA) 10 MG tablet Take  1/2 to 1 tablet  every Morning for BP   cholecalciferol (VITAMIN D) 1000 UNITS tablet Take 2,000 Units by mouth daily. 2000 units daily   estradiol (ESTRACE) 0.5 MG tablet TAKE 1/2 TABLET BY MOUTH 3 TIMES A WEEK   olmesartan (BENICAR) 40 MG tablet Take 1/2 to 1 tablet at night   for BP   Red Yeast Rice 600 MG CAPS PRN   tiZANidine (ZANAFLEX) 2 MG tablet Take 1 tablet (2 mg total) by mouth every 8 (eight) hours as needed for muscle spasms.   venlafaxine XR (EFFEXOR-XR) 75 MG 24 hr capsule TAKE 1 CAPSULE(75 MG) BY MOUTH DAILY   ZEPBOUND 5 MG/0.5ML injection vial INJECT 0.5 ML (5 MG) UNDER THE SKIN ONCE WEEKLY   No current facility-administered medications on file prior to visit.    ROS: all negative except what is noted in the HPI.   Physical Exam:  BP 120/80   Pulse 70   Temp 98 F (36.7 C)   Resp 16   Ht 5' 4.5" (1.638 m)   Wt 182 lb 12.8 oz (82.9 kg)   BMI 30.89 kg/m   General Appearance: NAD.  Awake, conversant and cooperative. Eyes: PERRLA, EOMs intact.  Sclera white.  Conjunctiva without erythema. Sinuses: No frontal/maxillary tenderness.  No nasal discharge. Nares patent.  ENT/Mouth: Ext aud canals clear.  Bilateral TMs w/DOL and without erythema or bulging. Hearing intact.  Posterior pharynx without swelling or exudate.  Tonsils without swelling or erythema.  Neck: Supple.  No masses, nodules or thyromegaly. Respiratory: Effort is regular with non-labored breathing. Breath sounds are equal bilaterally without rales, rhonchi, wheezing or stridor.  Cardio: RRR with no MRGs. Brisk peripheral pulses without edema.  Abdomen: Active BS in all four quadrants.  Soft and non-tender without guarding, rebound tenderness, hernias or masses. Lymphatics: Non tender without lymphadenopathy.  Musculoskeletal: Full ROM, 5/5 strength, normal ambulation.  No clubbing or cyanosis. Skin: Bilateral hands and fingers with scattered vesicle type rash with underlying erythema.  Warm.  Appropriate color for ethnicity. Appropriate color for ethnicity. Warm without rashes, lesions, ecchymosis, ulcers.  Neuro: CN II-XII grossly normal. Normal muscle tone without cerebellar symptoms and intact sensation.   Psych: AO X 3,  appropriate mood and affect, insight and judgment.      Adela Glimpse, NP 9:02 PM [facility]

## 2023-11-23 ENCOUNTER — Encounter: Payer: Self-pay | Admitting: Nurse Practitioner

## 2023-11-23 NOTE — Patient Instructions (Signed)
Herpetic Whitlow Herpetic whitlow is an infection that affects the skin of the fingers or hand. It is caused by human herpesvirus, also called herpes simplex virus (HSV). The infection can come back (recur), but the first occurrence is usually the most severe. HSV commonly spreads to the mouth (Type 1) and genitals (Type 2), but it can affect many other parts of the body. Some people who get herpetic whitlow are already infected with HSV. What are the causes? This condition is caused by herpes simplex virus type 1 or type 2 (HSV-1 or HSV-2). You can get herpetic whitlow if your fingers or hand have contact with body fluids that are infected with either of these viruses. Body fluids include: Discharge from a herpes sore. Saliva. Semen. Vaginal fluid. What increases the risk? This condition is more likely to develop in: Young children. This is because they often put their fingers in their mouths. People who work in the health care industry, particularly in dentistry. Health care workers can get herpetic whitlow from touching the mouths of patients who have oral herpes. People who are already infected with HSV. Athletes who participate in contact sports, such as wrestling. What are the signs or symptoms? Symptoms of this condition usually develop 2-14 days after exposure to the virus. Symptoms may include: Pain, burning, or tingling in the affected area. Fever. Redness and swelling in the affected area. Small fluid-filled bumps around the infected area. These bumps may blister and will develop into sores. The blisters and sores may break open. Swollen lymph nodes. Symptoms usually improve days after they appear and can clear up within a few weeks. How is this diagnosed? This condition may be diagnosed based on a physical exam and your medical history. You may also have tests, including: Culture test. This test checks for the virus in a sample of fluid from an open sore. Culture tests take  several days to complete. Blood tests. This test checks the sample for: HSV antibodies. Antibodies are proteins that your body makes to help fight infection. This test checks whether antibodies against HSV are in your blood. HSV antigens. This checks for the presence of the HSV virus (antigen) in your blood. Talk with your health care provider about what your results mean. In some cases, your health care provider may do more testing to confirm the results. How is this treated? Treatment is not usually needed for this condition because symptoms usually get better on their own. However, your health care provider may recommend taking antiviral medicines to relieve symptoms and prevent the disease from spreading. Antiviral medicines can be taken through an IV, by mouth (orally), or through a skin (topical) ointment. It is important to remember that the herpes virus cannot be completely eliminated from your body, but treatment can help to prevent future outbreaks. Follow these instructions at home: Prevent the spread of infection The infection can spread to other people. It can also spread to other areas of your body, especially to your mouth and your genital area. To keep it from spreading: Cover the affected area with a bandage (dressing). Keep the dressing dry. If you work in the health care or Agricultural consultant, wear gloves. Avoid close contact with other people until your sores heal and your symptoms are gone. Do not touch the bumps with your other hand or pick at your scabs. Wash your hands often for at least 20 seconds using soap and water. If soap and water are not available, use hand sanitizer. Do not touch  your eyes, mouth, or genital area unless you wash your hands first. Use latex or polyurethane condoms every time you have sex.  Managing pain and swelling  If directed, put ice on the affected area. To do this: Put ice in a plastic bag. Place a towel between your skin and the bag. Leave  the ice on for 20 minutes, 2-3 times a day. Remove the ice if your skin turns bright red. This is very important. If you cannot feel pain, heat, or cold, you have a greater risk of damage to the area. General instructions Take or apply over-the-counter and prescription medicines only as told by your health care provider. Keep all follow-up visits. This is important. Contact a health care provider if: The infection spreads to another area of your body. Your symptoms get worse. The infection returns. Summary Herpetic whitlow is an infection that affects the skin of the fingers or hand. This condition is caused by herpes simplex virus type 1 or type 2 (HSV-1 or HSV-2). You can get herpetic whitlow if your fingers or hand have contact with body fluids that are infected with either of these viruses. Treatment is usually not needed for this condition because symptoms usually resolve on their own. However, your health care provider may recommend taking or applying antiviral medicines. The infection can spread to other people. It can also spread to other areas of your body, especially to your mouth and your genital area. The herpes virus cannot be completely eliminated from your body, but treatment can help to prevent future outbreaks. This information is not intended to replace advice given to you by your health care provider. Make sure you discuss any questions you have with your health care provider. Document Revised: 08/20/2021 Document Reviewed: 08/20/2021 Elsevier Patient Education  2024 [employer].

## 2023-12-04 ENCOUNTER — Other Ambulatory Visit: Payer: Self-pay

## 2023-12-04 DIAGNOSIS — F411 Generalized anxiety disorder: Secondary | ICD-10-CM

## 2023-12-04 DIAGNOSIS — F322 Major depressive disorder, single episode, severe without psychotic features: Secondary | ICD-10-CM

## 2023-12-04 MED ORDER — VENLAFAXINE HCL ER 75 MG PO CP24: 75.0000 mg | ORAL_CAPSULE | Freq: Every day | ORAL | 2 refills | Status: DC

## 2023-12-05 ENCOUNTER — Other Ambulatory Visit: Payer: Self-pay | Admitting: Family

## 2023-12-05 DIAGNOSIS — B029 Zoster without complications: Secondary | ICD-10-CM

## 2023-12-05 MED ORDER — VALACYCLOVIR HCL 1 G PO TABS: 1000.0000 mg | ORAL_TABLET | Freq: Three times a day (TID) | ORAL | 0 refills | Status: DC

## 2023-12-15 ENCOUNTER — Other Ambulatory Visit: Payer: Self-pay

## 2023-12-15 DIAGNOSIS — F411 Generalized anxiety disorder: Secondary | ICD-10-CM

## 2023-12-15 DIAGNOSIS — F322 Major depressive disorder, single episode, severe without psychotic features: Secondary | ICD-10-CM

## 2023-12-15 MED ORDER — VENLAFAXINE HCL ER 37.5 MG PO CP24: 37.5000 mg | ORAL_CAPSULE | Freq: Every day | ORAL | 2 refills | Status: AC

## 2023-12-22 ENCOUNTER — Encounter: Payer: Medicare Other | Admitting: Nurse Practitioner

## 2023-12-22 NOTE — Progress Notes (Signed)
 This encounter was created in error - please disregard.

## 2024-01-27 ENCOUNTER — Ambulatory Visit: Payer: Medicare Other | Admitting: Family Medicine

## 2024-02-02 ENCOUNTER — Encounter: Payer: Self-pay | Admitting: Nurse Practitioner

## 2024-02-02 ENCOUNTER — Ambulatory Visit (INDEPENDENT_AMBULATORY_CARE_PROVIDER_SITE_OTHER): Admitting: Nurse Practitioner

## 2024-02-02 VITALS — BP 128/80 | HR 62 | Temp 97.5°F | Ht 65.0 in | Wt 186.6 lb

## 2024-02-02 DIAGNOSIS — M545 Low back pain, unspecified: Secondary | ICD-10-CM | POA: Diagnosis not present

## 2024-02-02 DIAGNOSIS — Z1212 Encounter for screening for malignant neoplasm of rectum: Secondary | ICD-10-CM

## 2024-02-02 DIAGNOSIS — M8580 Other specified disorders of bone density and structure, unspecified site: Secondary | ICD-10-CM

## 2024-02-02 DIAGNOSIS — Z1159 Encounter for screening for other viral diseases: Secondary | ICD-10-CM

## 2024-02-02 DIAGNOSIS — E782 Mixed hyperlipidemia: Secondary | ICD-10-CM | POA: Diagnosis not present

## 2024-02-02 DIAGNOSIS — E559 Vitamin D deficiency, unspecified: Secondary | ICD-10-CM

## 2024-02-02 DIAGNOSIS — Z1211 Encounter for screening for malignant neoplasm of colon: Secondary | ICD-10-CM

## 2024-02-02 DIAGNOSIS — F411 Generalized anxiety disorder: Secondary | ICD-10-CM

## 2024-02-02 DIAGNOSIS — Z79899 Other long term (current) drug therapy: Secondary | ICD-10-CM

## 2024-02-02 DIAGNOSIS — G8929 Other chronic pain: Secondary | ICD-10-CM

## 2024-02-02 DIAGNOSIS — I10 Essential (primary) hypertension: Secondary | ICD-10-CM | POA: Diagnosis not present

## 2024-02-02 DIAGNOSIS — N952 Postmenopausal atrophic vaginitis: Secondary | ICD-10-CM

## 2024-02-02 DIAGNOSIS — E2839 Other primary ovarian failure: Secondary | ICD-10-CM

## 2024-02-02 MED ORDER — BISOPROLOL FUMARATE 10 MG PO TABS: 10.0000 mg | ORAL_TABLET | Freq: Every day | ORAL | 1 refills | Status: AC

## 2024-02-02 MED ORDER — OLMESARTAN MEDOXOMIL 40 MG PO TABS: 40.0000 mg | ORAL_TABLET | Freq: Every day | ORAL | 1 refills | Status: AC

## 2024-02-02 NOTE — Progress Notes (Signed)
 Careteam: Patient Care Team: Sharon Seller, NP as PCP - General (Geriatric Medicine) Maxie Better, MD as Consulting Physician (Obstetrics and Gynecology) Yates Decamp, MD as Consulting Physician (Cardiology) Hart Carwin, MD (Inactive) as Consulting Physician (Gastroenterology) Elmon Else, MD as Consulting Physician (Dermatology) Dorisann Frames, MD as Consulting Physician (Endocrinology)  PLACE OF SERVICE:  [facility]  Advanced Directive information    Allergies  Allergen Reactions   Doxycycline Hives    Chief Complaint  Patient presents with   Establish Care    New patient to establish care and discuss medication. Discuss advance care planning. Pill bottles present EXCEPT Adderall (patient is taking differently than listed on active medication list). Discuss refills. Other passed away 3 weeks ago. Discuss vaccines.     HPI: pt is a 68 y.o. female to establish care.  Using alprazolam 0.5 mg as needed for sleep and anxiety - tries to limit to twice daily She used to take buspar for anxiety and then got forgotten. Unsure if that helped.  Using effexor 75 mg with 37.5 mg. Taking 150 mg daily made her more jittery.  Anxiety/depression not controlled.   ADD- she has had medication for a couple of years she had a 90 days prescription and takes every morning but has never had to have it refilled. She only uses a quarter of a tablet which has been effective  Followed by podiatrist for raynaud in her feet. No longer seeing but using cream in the winter.   Using vit c for overall heath 3 times weekly   Hypertension- on benicar and bisoprolol daily - has had adjustments in medications Reports bp cuff showing elevated bp at home.   Her mother recently passed away .   Hyperlipidemia- using red yeast rice 3 times weekly. Always been pretty good but one visit was elevated so she started using to keep under control.   She has signed up for weight loss and life  coaching online. She has had a lot of stressors and now trying to change things.  Not exercising over the last 6 months.    Review of Systems:  Review of Systems  Constitutional:  Negative for chills, fever and weight loss.  HENT:  Positive for hearing loss. Negative for tinnitus.   Respiratory:  Negative for cough, sputum production and shortness of breath.   Cardiovascular:  Negative for chest pain, palpitations and leg swelling.  Gastrointestinal:  Negative for abdominal pain, constipation, diarrhea and heartburn.  Genitourinary:  Negative for dysuria, frequency and urgency.  Musculoskeletal:  Positive for joint pain. Negative for back pain, falls and myalgias.  Skin: Negative.   Neurological:  Negative for dizziness and headaches.  Psychiatric/Behavioral:  Positive for depression. Negative for memory loss. The patient is nervous/anxious. The patient does not have insomnia.     Past Medical History:  Diagnosis Date   Adenomatous endometrial hyperplasia    age 34 - hysterectomy    Anxiety    Arthritis    Bundle, branch block, left    Cataract    "beginnings of" per pt   Depression    Hyperlipidemia    no per pt- no meds taken   Hypertension    Melanoma (HCC) 2012   chest   Osteopenia    Past Surgical History:  Procedure Laterality Date   COLONOSCOPY     MELANOMA EXCISION     chest   PARTIAL HYSTERECTOMY  30   uterine polyps   POLYPECTOMY  TONSILLECTOMY     Social History:   reports that she has never smoked. She has never used smokeless tobacco. She reports current alcohol use of about 14.0 standard drinks of alcohol per week. She reports that she does not use drugs.  Family History  Problem Relation Age of Onset   Hypertension Mother    Cancer Father    Prostate cancer Father    Breast cancer Sister    Colon polyps Sister    Stroke Brother    Cancer Maternal Grandmother    Heart disease Paternal Grandmother    Stroke Paternal Grandmother    Cancer  Paternal Grandfather    Colon cancer Maternal Aunt    Esophageal cancer Neg Hx    Stomach cancer Neg Hx    Rectal cancer Neg Hx     Medications: Patient's Medications  New Prescriptions   No medications on file  Previous Medications   ALPRAZOLAM (XANAX) 0.5 MG TABLET    Take 1 tablet (0.5 mg total) by mouth at bedtime. Only as needed and try to limit to 5 days a week   AMBULATORY NON FORMULARY MEDICATION    Nifedipine 3% pentoxifyline 5% cream   AMPHETAMINE-DEXTROAMPHETAMINE (ADDERALL) 5 MG TABLET    Take  1/2 to 1 tablet   2 x /day   for ADD, Focus & Concentration   ASCORBIC ACID (VITA-C PO)    Take 282 mg by mouth 3 (three) times a week.   B COMPLEX VITAMINS CAPSULE    Take 1 capsule by mouth 3 (three) times a week.   CALCIUM CITRATE-VITAMIN D (CALCIUM CITRATE + PO)    Take 400 mcg by mouth as needed. Sporadically   CHOLECALCIFEROL (VITAMIN D-3) 125 MCG (5000 UT) TABS    Take 1 tablet by mouth 3 (three) times a week.   ESTRADIOL (ESTRACE) 0.5 MG TABLET    TAKE 1/2 TABLET BY MOUTH 3 TIMES A WEEK   MULTIPLE VITAMIN (MULTIVITAMIN) TABLET    Take 1 tablet by mouth 3 (three) times a week.   RED YEAST RICE EXTRACT (RED YEAST RICE PO)    Take 1,200 mg by mouth 3 (three) times a week.   TIZANIDINE (ZANAFLEX) 2 MG TABLET    Take 1 tablet (2 mg total) by mouth every 8 (eight) hours as needed for muscle spasms.   VENLAFAXINE XR (EFFEXOR-XR) 37.5 MG 24 HR CAPSULE    Take 1 capsule (37.5 mg total) by mouth daily with breakfast.   VENLAFAXINE XR (EFFEXOR-XR) 75 MG 24 HR CAPSULE    Take 1 capsule (75 mg total) by mouth daily.  Modified Medications   Modified Medication Previous Medication   BISOPROLOL (ZEBETA) 10 MG TABLET bisoprolol (ZEBETA) 10 MG tablet      Take 1 tablet (10 mg total) by mouth daily.    Take  1/2 to 1 tablet  every Morning for BP   OLMESARTAN (BENICAR) 40 MG TABLET olmesartan (BENICAR) 40 MG tablet      Take 1 tablet (40 mg total) by mouth daily.    Take 1/2 to 1 tablet at night   for BP  Discontinued Medications   BISOPROLOL (ZEBETA) 10 MG TABLET    Take 10 mg by mouth daily.   CHOLECALCIFEROL (VITAMIN D) 1000 UNITS TABLET    Take 2,000 Units by mouth daily. 2000 units daily   OLMESARTAN (BENICAR) 40 MG TABLET    Take 40 mg by mouth daily.   PROBIOTIC PRODUCT (PROBIOTIC BLEND PO)  Take 1 capsule by mouth as needed. Sporadically   RED YEAST RICE 600 MG CAPS    PRN   VALACYCLOVIR (VALTREX) 1000 MG TABLET    Take 1 tablet (1,000 mg total) by mouth 3 (three) times daily. Treat for 7 days.   VITAMIN K 100 MCG TABLET    Take 100 mcg by mouth as directed. Infrequently   ZEPBOUND 5 MG/0.5ML INJECTION VIAL    INJECT 0.5 ML (5 MG) UNDER THE SKIN ONCE WEEKLY    Physical Exam:  Vitals:   02/02/24 0950  BP: 128/80  Pulse: 62  Temp: (!) 97.5 F (36.4 C)  SpO2: 97%  Weight: 186 lb 9.6 oz (84.6 kg)  Height: 5\' 5"  (1.651 m)   Body mass index is 31.05 kg/m. Wt Readings from Last 3 Encounters:  02/02/24 186 lb 9.6 oz (84.6 kg)  11/21/23 182 lb 12.8 oz (82.9 kg)  03/12/23 185 lb 9.6 oz (84.2 kg)    Physical Exam Constitutional:      General: She is not in acute distress.    Appearance: She is well-developed. She is not diaphoretic.  HENT:     Head: Normocephalic and atraumatic.     Mouth/Throat:     Pharynx: No oropharyngeal exudate.  Eyes:     Conjunctiva/sclera: Conjunctivae normal.     Pupils: Pupils are equal, round, and reactive to light.  Cardiovascular:     Rate and Rhythm: Normal rate and regular rhythm.     Heart sounds: Normal heart sounds.  Pulmonary:     Effort: Pulmonary effort is normal.     Breath sounds: Normal breath sounds.  Abdominal:     General: Bowel sounds are normal.     Palpations: Abdomen is soft.  Musculoskeletal:     Cervical back: Normal range of motion and neck supple.     Right lower leg: No edema.     Left lower leg: No edema.  Skin:    General: Skin is warm and dry.  Neurological:     Mental Status: She is alert.   Psychiatric:        Mood and Affect: Mood normal.     Labs reviewed: Basic Metabolic Panel: Recent Labs    02/11/23 1524  NA 137  K 4.5  CL 101  CO2 28  GLUCOSE 91  BUN 13  CREATININE 0.67  CALCIUM 9.7   Liver Function Tests: Recent Labs    02/11/23 1524  AST 20  ALT 34*  BILITOT 0.3  PROT 6.5   No results for input(s): "LIPASE", "AMYLASE" in the last 8760 hours. No results for input(s): "AMMONIA" in the last 8760 hours. CBC: Recent Labs    02/11/23 1524  WBC 8.5  NEUTROABS 5,746  HGB 15.7*  HCT 46.4*  MCV 91.5  PLT 338   Lipid Panel: Recent Labs    02/11/23 1524  CHOL 222*  HDL 81  LDLCALC 113*  TRIG 167*  CHOLHDL 2.7   TSH: No results for input(s): "TSH" in the last 8760 hours. A1C: Lab Results  Component Value Date   HGBA1C 5.4 08/07/2022     Assessment/Plan 1. Primary hypertension -Blood pressure well controlled, goal bp <140/90 Continue current medications and dietary modifications follow metabolic panel - bisoprolol (ZEBETA) 10 MG tablet; Take 1 tablet (10 mg total) by mouth daily.  Dispense: 90 tablet; Refill: 1 - olmesartan (BENICAR) 40 MG tablet; Take 1 tablet (40 mg total) by mouth daily.  Dispense: 90 tablet; Refill: 1 -  COMPLETE METABOLIC PANEL WITHOUT GFR - CBC with Differential/Platelet  2. Chronic bilateral low back pain without sciatica (Primary) Has chronic low back pain that is exacerbated at times. Uses zanaflex PRN for flares  3. Anxiety state -ongoing, continues on Effexor    4. Mixed hyperlipidemia Continues on red yeast rice and dietary modifications - Lipid panel  5. Vitamin D deficiency Continues on vit d supplement  6. Osteopenia, unspecified location -Recommended to take calcium 600 mg twice daily with Vitamin D 2000 units daily and weight bearing activity 30 mins/5 days a week  7. Estrogen deficiency - DG BONE DENSITY (DXA); Future  8. Vaginal atrophy Maintained on estrace three times weekly   9.  Need for hepatitis C screening test - Hep C Antibody  10. Encounter for colorectal cancer screening - Ambulatory referral to Gastroenterology  11. High risk medication use - DRUG MONITORING, PANEL 6 WITH CONFIRMATION, URINE   Daxtyn Rottenberg K. Denney Fisherman [facility] [phone]

## 2024-02-02 NOTE — Patient Instructions (Signed)
 Take b complexs in morning

## 2024-02-03 LAB — CBC WITH DIFFERENTIAL/PLATELET
Absolute Lymphocytes: 1676 {cells}/uL (ref 850–3900)
Absolute Monocytes: 731 {cells}/uL (ref 200–950)
Basophils Absolute: 43 {cells}/uL (ref 0–200)
Basophils Relative: 0.6 %
Eosinophils Absolute: 128 {cells}/uL (ref 15–500)
Eosinophils Relative: 1.8 %
HCT: 48.2 % — ABNORMAL HIGH (ref 35.0–45.0)
Hemoglobin: 15.9 g/dL — ABNORMAL HIGH (ref 11.7–15.5)
MCH: 31.2 pg (ref 27.0–33.0)
MCHC: 33 g/dL (ref 32.0–36.0)
MCV: 94.5 fL (ref 80.0–100.0)
MPV: 9.5 fL (ref 7.5–12.5)
Monocytes Relative: 10.3 %
Neutro Abs: 4523 {cells}/uL (ref 1500–7800)
Neutrophils Relative %: 63.7 %
Platelets: 322 10*3/uL (ref 140–400)
RBC: 5.1 10*6/uL (ref 3.80–5.10)
RDW: 12.3 % (ref 11.0–15.0)
Total Lymphocyte: 23.6 %
WBC: 7.1 10*3/uL (ref 3.8–10.8)

## 2024-02-03 LAB — COMPLETE METABOLIC PANEL WITHOUT GFR
AG Ratio: 2 (calc) (ref 1.0–2.5)
ALT: 24 U/L (ref 6–29)
AST: 17 U/L (ref 10–35)
Albumin: 4.4 g/dL (ref 3.6–5.1)
Alkaline phosphatase (APISO): 76 U/L (ref 37–153)
BUN: 11 mg/dL (ref 7–25)
CO2: 29 mmol/L (ref 20–32)
Calcium: 9.6 mg/dL (ref 8.6–10.4)
Chloride: 99 mmol/L (ref 98–110)
Creat: 0.7 mg/dL (ref 0.50–1.05)
Globulin: 2.2 g/dL (ref 1.9–3.7)
Glucose, Bld: 90 mg/dL (ref 65–99)
Potassium: 4.4 mmol/L (ref 3.5–5.3)
Sodium: 135 mmol/L (ref 135–146)
Total Bilirubin: 0.3 mg/dL (ref 0.2–1.2)
Total Protein: 6.6 g/dL (ref 6.1–8.1)

## 2024-02-03 LAB — HEPATITIS C ANTIBODY: Hepatitis C Ab: NONREACTIVE

## 2024-02-03 LAB — LIPID PANEL
Cholesterol: 215 mg/dL — ABNORMAL HIGH (ref <200)
HDL: 92 mg/dL (ref >=50)
LDL Cholesterol (Calc): 100 mg/dL — ABNORMAL HIGH
Non-HDL Cholesterol (Calc): 123 mg/dL (ref <130)
Total CHOL/HDL Ratio: 2.3 (calc) (ref <5.0)
Triglycerides: 135 mg/dL (ref <150)

## 2024-02-04 ENCOUNTER — Encounter: Payer: Self-pay | Admitting: Nurse Practitioner

## 2024-02-04 ENCOUNTER — Ambulatory Visit: Admitting: Nurse Practitioner

## 2024-02-04 VITALS — BP 144/80 | HR 59 | Temp 97.3°F | Resp 18 | Ht 65.0 in | Wt 188.0 lb

## 2024-02-04 DIAGNOSIS — Z0000 Encounter for general adult medical examination without abnormal findings: Secondary | ICD-10-CM | POA: Diagnosis not present

## 2024-02-04 LAB — DRUG MONITORING, PANEL 6 WITH CONFIRMATION, URINE
6 Acetylmorphine: NEGATIVE ng/mL (ref <10)
Alcohol Metabolites: POSITIVE ng/mL — AB (ref <500)
Amphetamines: NEGATIVE ng/mL (ref <500)
Barbiturates: NEGATIVE ng/mL (ref <300)
Benzodiazepines: NEGATIVE ng/mL (ref <100)
Cocaine Metabolite: NEGATIVE ng/mL (ref <150)
Creatinine: 55.7 mg/dL (ref >=20.0)
Ethyl Glucuronide (ETG): 4158 ng/mL — ABNORMAL HIGH (ref <500)
Ethyl Sulfate (ETS): 954 ng/mL — ABNORMAL HIGH (ref <100)
Marijuana Metabolite: NEGATIVE ng/mL (ref <20)
Methadone Metabolite: NEGATIVE ng/mL (ref <100)
Opiates: NEGATIVE ng/mL (ref <100)
Oxidant: NEGATIVE ug/mL (ref <200)
Oxycodone: NEGATIVE ng/mL (ref <100)
Phencyclidine: NEGATIVE ng/mL (ref <25)
pH: 7.1 (ref 4.5–9.0)

## 2024-02-04 LAB — DM TEMPLATE

## 2024-02-04 NOTE — Progress Notes (Signed)
 Subjective:   Maureen Henderson is a 68 y.o. female who presents for Medicare Annual (Subsequent) preventive examination.  Visit Complete: In person [facility]   Cardiac Risk Factors include: dyslipidemia;hypertension;sedentary lifestyle;obesity (BMI >30kg/m2);family history of premature cardiovascular disease     Objective:    Today's Vitals   02/04/24 0822 02/04/24 1150  BP: (!) 140/84 (!) 144/80  Pulse: (!) 59   Resp: 18   Temp: (!) 97.3 F (36.3 C)   SpO2: 98%   Weight: 188 lb (85.3 kg)   Height: 5\' 5"  (1.651 m)    Body mass index is 31.28 kg/m.     02/04/2024   11:09 AM 07/03/2021    3:07 PM 02/16/2014    2:20 PM  Advanced Directives  Does Patient Have a Medical Advance Directive? Yes Yes Patient has advance directive, copy not in chart  Type of Advance Directive Healthcare Power of [place];Living will Healthcare Power of [place];Living will   Does patient want to make changes to medical advance directive? No - Patient declined No - Patient declined   Copy of Healthcare Power of Attorney in Chart? No - copy requested No - copy requested     Current Medications (verified) Outpatient Encounter Medications as of 02/04/2024  Medication Sig   ALPRAZolam (XANAX) 0.5 MG tablet Take 1 tablet (0.5 mg total) by mouth at bedtime. Only as needed and try to limit to 5 days a week   AMBULATORY NON FORMULARY MEDICATION Nifedipine 3% pentoxifyline 5% cream   amphetamine-dextroamphetamine (ADDERALL) 5 MG tablet Take  1/2 to 1 tablet   2 x /day   for ADD, Focus & Concentration (Patient taking differently: Take 1.25 mg by mouth daily.)   b complex vitamins capsule Take 1 capsule by mouth 3 (three) times a week.   bisoprolol (ZEBETA) 10 MG tablet Take 1 tablet (10 mg total) by mouth daily.   Cholecalciferol (VITAMIN D-3) 125 MCG (5000 UT) TABS Take 1 tablet by mouth 3 (three) times a week.   estradiol (ESTRACE) 0.5 MG tablet TAKE 1/2 TABLET BY MOUTH 3 TIMES A WEEK   Multiple Vitamin  (MULTIVITAMIN) tablet Take 1 tablet by mouth 3 (three) times a week.   olmesartan (BENICAR) 40 MG tablet Take 1 tablet (40 mg total) by mouth daily.   Red Yeast Rice Extract (RED YEAST RICE PO) Take 1,200 mg by mouth 3 (three) times a week.   tiZANidine (ZANAFLEX) 2 MG tablet Take 1 tablet (2 mg total) by mouth every 8 (eight) hours as needed for muscle spasms.   venlafaxine XR (EFFEXOR-XR) 37.5 MG 24 hr capsule Take 1 capsule (37.5 mg total) by mouth daily with breakfast.   venlafaxine XR (EFFEXOR-XR) 75 MG 24 hr capsule Take 1 capsule (75 mg total) by mouth daily.   Ascorbic Acid (VITA-C PO) Take 282 mg by mouth 3 (three) times a week. (Patient not taking: Reported on 02/04/2024)   Calcium Citrate-Vitamin D (CALCIUM CITRATE + PO) Take 400 mcg by mouth as needed. Sporadically (Patient not taking: Reported on 02/04/2024)   No facility-administered encounter medications on file as of 02/04/2024.    Allergies (verified) Doxycycline   History: Past Medical History:  Diagnosis Date   Adenomatous endometrial hyperplasia    age 48 - hysterectomy    Anxiety    Arthritis    Bundle, branch block, left    Cataract    "beginnings of" per pt   Depression    Hyperlipidemia    no per pt- no meds  taken   Hypertension    Melanoma (HCC) 2012   chest   Osteopenia    Past Surgical History:  Procedure Laterality Date   COLONOSCOPY     MELANOMA EXCISION     chest   PARTIAL HYSTERECTOMY  30   uterine polyps   POLYPECTOMY     TONSILLECTOMY     Family History  Problem Relation Age of Onset   Hypertension Mother    Cancer Father    Prostate cancer Father    Breast cancer Sister    Colon polyps Sister    Stroke Brother    Cancer Maternal Grandmother    Heart disease Paternal Grandmother    Stroke Paternal Grandmother    Cancer Paternal Grandfather    Colon cancer Maternal Aunt    Esophageal cancer Neg Hx    Stomach cancer Neg Hx    Rectal cancer Neg Hx    Social History    Socioeconomic History   Marital status: Married    Spouse name: Not on file   Number of children: Not on file   Years of education: Not on file   Highest education level: Bachelor's degree (e.g., BA, AB, BS)  Occupational History   Not on file  Tobacco Use   Smoking status: Never   Smokeless tobacco: Never  Vaping Use   Vaping status: Never Used  Substance and Sexual Activity   Alcohol use: Yes    Alcohol/week: 14.0 standard drinks of alcohol    Types: 14 Glasses of wine per week   Drug use: No   Sexual activity: Not Currently  Other Topics Concern   Not on file  Social History Narrative   Not on file   Social Drivers of Health   Financial Resource Strain: Low Risk  (12/21/2023)   Overall Financial Resource Strain (CARDIA)    Difficulty of Paying Living Expenses: Not hard at all  Food Insecurity: No Food Insecurity (12/21/2023)   Hunger Vital Sign    Worried About Running Out of Food in the Last Year: Never true    Ran Out of Food in the Last Year: Never true  Transportation Needs: No Transportation Needs (12/21/2023)   PRAPARE - Administrator, Civil Service (Medical): No    Lack of Transportation (Non-Medical): No  Physical Activity: Insufficiently Active (12/21/2023)   Exercise Vital Sign    Days of Exercise per Week: 3 days    Minutes of Exercise per Session: 10 min  Stress: Stress Concern Present (12/21/2023)   [employer] of Occupational Health - Occupational Stress Questionnaire    Feeling of Stress : Very much  Social Connections: Unknown (12/21/2023)   Social Connection and Isolation Panel [NHANES]    Frequency of Communication with Friends and Family: Three times a week    Frequency of Social Gatherings with Friends and Family: Patient declined    Attends Religious Services: Patient declined    Database administrator or Organizations: Patient declined    Attends Engineer, structural: Not on file    Marital Status: Married    Tobacco  Counseling Counseling given: Yes   Clinical Intake:  Pre-visit preparation completed: Yes  Pain : No/denies pain     BMI - recorded: 31.28 Nutritional Status: BMI > 30  Obese Nutritional Risks: None Diabetes: No  How often do you need to have someone help you when you read instructions, pamphlets, or other written materials from your doctor or pharmacy?: 1 - Never  Activities of Daily Living    02/04/2024   11:34 AM 02/03/2024    5:27 PM  In your present state of health, do you have any difficulty performing the following activities:  Hearing? 0 0  Vision? 0 0  Difficulty concentrating or making decisions? 1 1  Walking or climbing stairs? 0 0  Dressing or bathing? 0 0  Doing errands, shopping? 0 0  Preparing Food and eating ? N N  Using the Toilet? N N  In the past six months, have you accidently leaked urine? Y Y  Do you have problems with loss of bowel control? Y   Managing your Medications? N N  Managing your Finances? N N  Housekeeping or managing your Housekeeping? N N    Patient Care Team: Sharon Seller, NP as PCP - General (Geriatric Medicine) Maxie Better, MD as Consulting Physician (Obstetrics and Gynecology) Yates Decamp, MD as Consulting Physician (Cardiology) Hart Carwin, MD (Inactive) as Consulting Physician (Gastroenterology) Elmon Else, MD as Consulting Physician (Dermatology) Dorisann Frames, MD as Consulting Physician (Endocrinology)  Indicate any recent Medical Services you may have received from other than [facility] providers in the past year (date may be approximate).     Assessment:   This is a routine wellness examination for Maureen Henderson.  Hearing/Vision screen Hearing Screening - Comments:: Dr Jenne Pane Vision Screening - Comments:: [facility] assoicates   Goals Addressed   None    Depression Screen    02/04/2024   11:06 AM 02/02/2024    9:52 AM 10/10/2022    8:15 PM 07/03/2021    3:08 PM 10/02/2014   10:13 PM  PHQ  2/9 Scores  PHQ - 2 Score 6 0 0 6 0  PHQ- 9 Score 19   23     Fall Risk    02/04/2024   11:05 AM 02/03/2024    5:27 PM 02/02/2024    9:52 AM 10/10/2022    8:15 PM 07/03/2021    3:08 PM  Fall Risk   Falls in the past year? 0 1 0 0 0  Number falls in past yr: 0 0 0  0  Injury with Fall? 0 0 0  0  Risk for fall due to :   No Fall Risks No Fall Risks No Fall Risks  Follow up   Falls evaluation completed Falls prevention discussed;Education provided;Falls evaluation completed Falls evaluation completed;Falls prevention discussed    MEDICARE RISK AT HOME: Medicare Risk at Home Any stairs in or around the home?: Yes If so, are there any without handrails?: No Home free of loose throw rugs in walkways, pet beds, electrical cords, etc?: Yes Adequate lighting in your home to reduce risk of falls?: Yes Life alert?: No Use of a cane, walker or w/c?: No Grab bars in the bathroom?: No Shower chair or bench in shower?: No Elevated toilet seat or a handicapped toilet?: No  TIMED UP AND GO:  Was the test performed?  No    Cognitive Function:        02/04/2024   11:09 AM  6CIT Screen  What Year? 0 points  What month? 0 points  What time? 0 points  Count back from 20 0 points  Months in reverse 0 points  Repeat phrase 0 points  Total Score 0 points    Immunizations Immunization History  Administered Date(s) Administered   Fluad Quad(high Dose 65+) 08/13/2021   Influenza Inj Mdck Quad With Preservative 09/18/2017, 09/03/2018   Influenza-Unspecified 08/05/2013, 09/18/2014, 11/17/2016  Janssen (J&J) SARS-COV-2 Vaccination 12/30/2019, 12/30/2019   Moderna SARS-COV2 Booster Vaccination 10/06/2020, 10/06/2020   PPD Test 09/30/2014   Pneumococcal Polysaccharide-23 06/05/2021   Td 08/30/2005   Tdap 08/13/2021   Typhoid Inactivated 03/30/1960, 04/09/1960, 04/17/1960   Vaccinia,smallpox Monkeypox Vaccine Live,pf 07/06/1957   Zoster, Live 12/18/2016    TDAP status: Up to  date  Flu Vaccine status: Up to date  Pneumococcal vaccine status: Declined,  Education has been provided regarding the importance of this vaccine but patient still declined. Advised may receive this vaccine at local pharmacy or Health Dept. Aware to provide a copy of the vaccination record if obtained from local pharmacy or Health Dept. Verbalized acceptance and understanding.   Covid-19 vaccine status: Information provided on how to obtain vaccines.   Qualifies for Shingles Vaccine? Yes   Zostavax completed No   Shingrix Completed?: Yes  Screening Tests Health Maintenance  Topic Date Due   Zoster Vaccines- Shingrix (1 of 2) 02/11/1975   Colonoscopy  04/09/2024 (Originally 08/10/2021)   Pneumonia Vaccine 73+ Years old (2 of 2 - PCV) 05/14/2024 (Originally 06/05/2022)   COVID-19 Vaccine (3 - 2024-25 season) 06/04/2024 (Originally 06/22/2023)   INFLUENZA VACCINE  05/21/2024   Medicare Annual Wellness (AWV)  02/03/2025   MAMMOGRAM  09/02/2025   DTaP/Tdap/Td (3 - Td or Tdap) 08/14/2031   DEXA SCAN  Completed   Hepatitis C Screening  Completed   HPV VACCINES  Aged Out   Meningococcal B Vaccine  Aged Out    Health Maintenance  Health Maintenance Due  Topic Date Due   Zoster Vaccines- Shingrix (1 of 2) 02/11/1975    Colorectal cancer screening: Referral to GI placed  . Pt aware the office will call re: appt.  Mammogram status: Completed 09/03/2023. Repeat every year/  Bone Density status: Ordered 02/02/2024. Pt provided with contact info and advised to call to schedule appt.  Lung Cancer Screening: (Low Dose CT Chest recommended if Age 104-80 years, 20 pack-year currently smoking OR have quit w/in 15years.) does not qualify.   Lung Cancer Screening Referral: na  Additional Screening:  Hepatitis C Screening: does qualify; Completed 02/02/2024  Vision Screening: Recommended annual ophthalmology exams for early detection of glaucoma and other disorders of the eye. Is the patient  up to date with their annual eye exam?  Yes  Who is the provider or what is the name of the office in which the patient attends annual eye exams? [facility]  If pt is not established with a provider, would they like to be referred to a provider to establish care? No .   Dental Screening: Recommended annual dental exams for proper oral hygiene   Community Resource Referral / Chronic Care Management: CRR required this visit?  No /  CCM required this visit?  No     Plan:     I have personally reviewed and noted the following in the patient's chart:   Medical and social history Use of alcohol, tobacco or illicit drugs  Current medications and supplements including opioid prescriptions. Patient is not currently taking opioid prescriptions. Functional ability and status Nutritional status Physical activity Advanced directives List of other physicians Hospitalizations, surgeries, and ER visits in previous 12 months Vitals Screenings to include cognitive, depression, and falls Referrals and appointments  In addition, I have reviewed and discussed with patient certain preventive protocols, quality metrics, and best practice recommendations. A written personalized care plan for preventive services as well as general preventive health recommendations were provided to patient.  Verma Gobble, NP   02/04/2024

## 2024-02-04 NOTE — Patient Instructions (Addendum)
 Take both blood pressure medications in the morning To check blood pressure 1 hour AFTER you have had your medication Make sure you have been sitting at least 5 mins  Record and let us  know.  Goal <140/90   Maureen Henderson , Thank you for taking time to come for your Medicare Wellness Visit. I appreciate your ongoing commitment to your health goals. Please review the following plan we discussed and let me know if I can assist you in the future.   To get shingles vaccine at local pharmacy    This is a list of the screening recommended for you and due dates:  Health Maintenance  Topic Date Due   Zoster (Shingles) Vaccine (1 of 2) 02/11/1975   Colon Cancer Screening  04/09/2024*   Pneumonia Vaccine (2 of 2 - PCV) 05/14/2024*   COVID-19 Vaccine (3 - 2024-25 season) 06/04/2024*   Flu Shot  05/21/2024   Medicare Annual Wellness Visit  02/03/2025   Mammogram  09/02/2025   DTaP/Tdap/Td vaccine (3 - Td or Tdap) 08/14/2031   DEXA scan (bone density measurement)  Completed   Hepatitis C Screening  Completed   HPV Vaccine  Aged Out   Meningitis B Vaccine  Aged Out  *Topic was postponed. The date shown is not the original due date.

## 2024-02-09 DIAGNOSIS — L309 Dermatitis, unspecified: Secondary | ICD-10-CM | POA: Diagnosis not present

## 2024-02-09 DIAGNOSIS — L738 Other specified follicular disorders: Secondary | ICD-10-CM | POA: Diagnosis not present

## 2024-03-11 ENCOUNTER — Other Ambulatory Visit: Payer: Self-pay | Admitting: Family

## 2024-03-11 DIAGNOSIS — F411 Generalized anxiety disorder: Secondary | ICD-10-CM

## 2024-03-11 DIAGNOSIS — F322 Major depressive disorder, single episode, severe without psychotic features: Secondary | ICD-10-CM

## 2024-03-17 ENCOUNTER — Encounter: Payer: Self-pay | Admitting: Nurse Practitioner

## 2024-03-17 DIAGNOSIS — F411 Generalized anxiety disorder: Secondary | ICD-10-CM

## 2024-03-17 DIAGNOSIS — F322 Major depressive disorder, single episode, severe without psychotic features: Secondary | ICD-10-CM

## 2024-03-18 MED ORDER — VENLAFAXINE HCL ER 75 MG PO CP24
75.0000 mg | ORAL_CAPSULE | Freq: Every day | ORAL | 1 refills | Status: AC
Start: 2024-03-18 — End: ?

## 2024-03-18 NOTE — Telephone Encounter (Signed)
Pended Rx and sent to Jessica for approval due to HIGH ALERT Warning.  

## 2024-03-22 ENCOUNTER — Encounter: Payer: Self-pay | Admitting: Nurse Practitioner

## 2024-03-22 DIAGNOSIS — H5203 Hypermetropia, bilateral: Secondary | ICD-10-CM | POA: Diagnosis not present

## 2024-05-03 DIAGNOSIS — K080 Exfoliation of teeth due to systemic causes: Secondary | ICD-10-CM | POA: Diagnosis not present

## 2024-05-07 ENCOUNTER — Encounter: Payer: Self-pay | Admitting: Pediatrics

## 2024-05-10 ENCOUNTER — Ambulatory Visit: Admitting: Nurse Practitioner

## 2024-06-22 ENCOUNTER — Ambulatory Visit (AMBULATORY_SURGERY_CENTER): Admitting: *Deleted

## 2024-06-22 VITALS — Ht 65.0 in | Wt 180.0 lb

## 2024-06-22 DIAGNOSIS — Z1211 Encounter for screening for malignant neoplasm of colon: Secondary | ICD-10-CM

## 2024-06-22 NOTE — Progress Notes (Signed)
 Pt's name and DOB verified at the beginning of the pre-visit with 2 identifiers  Pt denies any difficulty with ambulating,sitting, laying down or rolling side to side  Pt has no issues moving head neck or swallowing  No egg or soy allergy known to patient   No issues known to pt with past sedation  No FH of Malignant Hyperthermia  Pt is not on home 02   Pt is not on blood thinners   Pt denies issues with constipation    Pt is not on dialysis  Pt hx of LBBB  Pt denies any upcoming cardiac testing  Patient's chart reviewed by Norleen Schillings CNRA prior to pre-visit and patient appropriate for the [facility].  Pre-visit completed and red dot placed by patient's name on their procedure day (on provider's schedule).    Visit by phone  Pt states weight is 180 lb Pt chose Miralax that she used last time  Pt given  both [facility] main # and MD on call # prior to instructions.  Informed pt to come in at the time discussed and is shown on PV instructions.  Pt instructed to use Single[employer]com or GoodRx for a price reduction on prep  Instructed pt to review instructions again prior to procedure and call main # given if has any questions or any issues. Pt states they will. Instructed pt where to find PV instructions in My Chart  Instructed pt on all aspects of written instructions including med holds clothing to wear and foods to eat and not eat as well as after procedure legal restrictions and to call MD on call if needed.. Pt states understanding.

## 2024-06-23 ENCOUNTER — Encounter: Payer: Self-pay | Admitting: Pediatrics

## 2024-06-25 ENCOUNTER — Telehealth: Admitting: Family Medicine

## 2024-06-25 ENCOUNTER — Ambulatory Visit: Admitting: Nurse Practitioner

## 2024-06-25 DIAGNOSIS — S41131A Puncture wound without foreign body of right upper arm, initial encounter: Secondary | ICD-10-CM | POA: Diagnosis not present

## 2024-06-25 DIAGNOSIS — W5501XA Bitten by cat, initial encounter: Secondary | ICD-10-CM

## 2024-06-25 MED ORDER — AMOXICILLIN-POT CLAVULANATE 875-125 MG PO TABS: 1.0000 | ORAL_TABLET | Freq: Two times a day (BID) | ORAL | 0 refills | Status: DC

## 2024-06-25 NOTE — Patient Instructions (Signed)
Animal Bite, Adult Animal bites range from mild to serious. An animal bite can result in any of these injuries: A scratch. A deep, open cut. Broken (punctured) or torn skin. A crush injury. A bone injury. A small bite from a house pet is usually less serious than a bite from a stray or wild animal. Cat bites can be more serious because their long, thin teeth can cause deep puncture wounds that close fast, trapping bacteria inside. Stray or wild animals, such as a raccoon, fox, skunk, or bat, are at higher risk of carrying a serious infection called rabies, which they can pass to a human through a bite. A bite from one of these animals needs medical care right away and, sometimes, rabies vaccination. What increases the risk? You are more likely to be bitten by an animal if: You are around unfamiliar pets. You disturb an animal when it is eating, sleeping, or caring for its babies. You are outdoors in a place where small, wild animals roam freely. What are the signs or symptoms? Common symptoms of an animal bite include: Pain. Bleeding. Swelling. Bruising. How is this diagnosed? This condition may be diagnosed based on a physical exam and medical history. Your health care provider will examine your wound and ask for details about the animal and how the bite happened. You may also have tests, such as: Blood tests to check for infection. X-rays to check for damage to bones or joints. Taking a fluid sample from your wound and checking it for infection (culture test). How is this treated? Treatment depends on the type of animal, where the bite is on your body, and your medical history. Treatment may include: Wound care. This often includes cleaning the wound and rinsing it out (flushing it) with saline solution, which is made of salt and water. A bandage (dressing) is also often applied. In rare cases, the wound may be closed with stitches (sutures), staples, skin glue, or adhesive  strips. Antibiotic medicine to prevent or treat infection. This medicine may be prescribed in pill or ointment form. If the bite area gets infected, the medicine may be given through an IV. A tetanus shot to prevent tetanus infection. Rabies treatment to prevent rabies infection, if the animal could have rabies. Surgery. This may be done if a bite gets infected or causes damage that needs to be repaired. Follow these instructions at home: Medicines Take or apply over-the-counter and prescription medicines only as told by your health care provider. If you were prescribed an antibiotic medicine, take or apply it as told by your health care provider. Do not stop using the antibiotic even if you start to feel better. Wound care  Follow instructions from your health care provider about how to take care of your wound. Make sure you: Wash your hands with soap and water for at least 20 seconds before and after you change your dressing. If soap and water are not available, use hand sanitizer. Change your dressing as told by your health care provider. Leave sutures, skin glue, or adhesive strips in place. These skin closures may need to stay in place for 2 weeks or longer. If adhesive strip edges start to loosen and curl up, you may trim the loose edges. Do not remove adhesive strips completely unless your health care provider tells you to do that. Check your wound every day for signs of infection. Check for: More redness, swelling, or pain. More fluid or blood. Warmth. Pus or a bad smell. General  instructions  Raise (elevate) the injured area above the level of your heart while you are sitting or lying down, if this is possible. If directed, put ice on the injured area. To do this: Put ice in a plastic bag. Place a towel between your skin and the bag. Leave the ice on for 20 minutes, 2-3 times per day. Remove the ice if your skin turns bright red. This is very important. If you cannot feel pain,  heat, or cold, you have a greater risk of damage to the area. Keep all follow-up visits. This is important. Contact a health care provider if: You have more redness, swelling, or pain around your wound. Your wound feels warm to the touch. You have a fever or chills. You have a general feeling of sickness (malaise). You feel nauseous or you vomit. You have pain that does not get better. Get help right away if: You have a red streak that leads away from your wound. You have non-clear fluid or more blood coming from your wound. There is pus or a bad smell coming from your wound. You have trouble moving your injured area. You have numbness or tingling that spreads beyond your wound. Summary Animal bites can range from mild to serious. An animal bite can cause a scratch on the skin, a deep and open cut, torn or punctured skin, a crush injury, or a bone injury. A bite from a stray or wild animal needs medical care right away and, sometimes, rabies vaccination. Your health care provider will examine your wound and ask for details about the animal and how the bite happened. Treatment may include wound care, antibiotic medicine, a tetanus shot, and rabies treatment if the animal could have rabies. This information is not intended to replace advice given to you by your health care provider. Make sure you discuss any questions you have with your health care provider. Document Revised: 10/12/2021 Document Reviewed: 10/12/2021 Elsevier Patient Education  2024 [employer].

## 2024-06-25 NOTE — Progress Notes (Signed)
 Virtual Visit Consent   Maureen Henderson, you are scheduled for a virtual visit with a [facility] provider today. Just as with appointments in the office, your consent must be obtained to participate. Your consent will be active for this visit and any virtual visit you may have with one of our providers in the next 365 days. If you have a MyChart account, a copy of this consent can be sent to you electronically.  As this is a virtual visit, video technology does not allow for your provider to perform a traditional examination. This may limit your provider's ability to fully assess your condition. If your provider identifies any concerns that need to be evaluated in person or the need to arrange testing (such as labs, EKG, etc.), we will make arrangements to do so. Although advances in technology are sophisticated, we cannot ensure that it will always work on either your end or our end. If the connection with a video visit is poor, the visit may have to be switched to a telephone visit. With either a video or telephone visit, we are not always able to ensure that we have a secure connection.  By engaging in this virtual visit, you consent to the provision of healthcare and authorize for your insurance to be billed (if applicable) for the services provided during this visit. Depending on your insurance coverage, you may receive a charge related to this service.  I need to obtain your verbal consent now. Are you willing to proceed with your visit today? Maureen Henderson has provided verbal consent on 06/25/2024 for a virtual visit (video or telephone). Loa Lamp, FNP  Date: 06/25/2024 3:38 PM   Virtual Visit via Video Note   I, Loa Lamp, connected with  Maureen Henderson  ([id number], 12-Dec-1955) on 06/25/24 at  3:45 PM EDT by a video-enabled telemedicine application and verified that I am speaking with the correct person using two identifiers.  Location: Patient: Virtual Visit Location Patient:  Home Provider: Virtual Visit Location Provider: Home Office   I discussed the limitations of evaluation and management by telemedicine and the availability of in person appointments. The patient expressed understanding and agreed to proceed.    History of Present Illness: Maureen Henderson is a 68 y.o. who identifies as a female who was assigned female at birth, and is being seen today for cat bite right forearm. Cat is quarantined. Puncture wounds noted. No severe redness or drainage and no fever. She is cleaning with hibiclens and applying mupirocin. TDAP up to date. 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 Only as needed and try to limit to 5 days a week (Patient  taking differently: Take 0.5 mg by mouth as needed. Only as needed and try to limit to 5 days a week), Disp: 25 tablet, Rfl: 0   AMBULATORY NON FORMULARY MEDICATION, Nifedipine 3% pentoxifyline 5% cream (Patient taking differently: as needed. Nifedipine 3% pentoxifyline 5% cream), Disp: 60 g, Rfl: PRN   amphetamine -dextroamphetamine  (ADDERALL) 5 MG tablet, Take  1/2 to 1 tablet   2 x /day   for ADD, Focus & Concentration  (Patient taking differently: Take 1.25 mg by mouth daily.), Disp: 180 tablet, Rfl: 0   Ascorbic Acid (VITA-C PO), Take 282 mg by mouth 3 (three) times a week., Disp: , Rfl:    b complex vitamins capsule, Take 1 capsule by mouth 3 (three) times a week., Disp: , Rfl:    bisoprolol  (ZEBETA ) 10 MG tablet, Take 1 tablet (10 mg total) by mouth daily., Disp: 90 tablet, Rfl: 1   Calcium Citrate-Vitamin D  (CALCIUM CITRATE + PO), Take 400 mcg by mouth as needed. Sporadically, Disp: , Rfl:    Cholecalciferol (VITAMIN D -3) 125 MCG (5000 UT) TABS, Take 1 tablet by mouth 3 (three) times a week., Disp: , Rfl:    estradiol  (ESTRACE ) 0.5 MG tablet, TAKE 1/2 TABLET BY MOUTH 3 TIMES A WEEK, Disp: 90 tablet, Rfl: 3   Multiple Vitamin (MULTIVITAMIN) tablet, Take 1 tablet by mouth 3 (three) times a week., Disp: , Rfl:    olmesartan  (BENICAR ) 40 MG tablet, Take 1 tablet (40 mg total) by mouth daily., Disp: 90 tablet, Rfl: 1   Red Yeast Rice Extract (RED YEAST RICE PO), Take 1,200 mg by mouth 3 (three) times a week. (Patient not taking: Reported on 06/22/2024), Disp: , Rfl:    tiZANidine  (ZANAFLEX ) 2 MG tablet, Take 1 tablet (2 mg total) by mouth every 8 (eight) hours as needed for muscle spasms., Disp: 30 tablet, Rfl: 0   triamcinolone ointment (KENALOG) 0.1 %, 1 Application as needed., Disp: , Rfl:    venlafaxine  XR (EFFEXOR -XR) 37.5 MG 24 hr capsule, Take 1 capsule (37.5 mg total) by mouth daily with breakfast., Disp: 30 capsule, Rfl: 2   venlafaxine  XR (EFFEXOR -XR) 75 MG 24 hr capsule, Take 1 capsule (75 mg total) by mouth daily., Disp: 90 capsule, Rfl: 1  Observations/Objective: Patient is well-developed, well-nourished in no acute distress.  Resting comfortably  at home.  Head is normocephalic, atraumatic.  No labored breathing.  Speech is clear and coherent with logical content.  Patient is alert and oriented at baseline.    Assessment and Plan: 1. Cat bite, initial encounter (Primary)  Continue to keep  clean and dry, take atb with food and probiotic, UC if sx worsen.   Follow Up Instructions: I discussed the assessment and treatment plan with the patient. The patient was provided an opportunity to ask questions and all were answered. The patient agreed with the plan and demonstrated an understanding of the instructions.  A copy of instructions were sent to the patient via MyChart unless otherwise noted below.    The patient was advised to call back or seek an in-person evaluation if the symptoms worsen or if the condition fails to improve as anticipated.    Sota Hetz, FNP

## 2024-06-27 ENCOUNTER — Encounter (HOSPITAL_COMMUNITY): Payer: Self-pay

## 2024-06-27 ENCOUNTER — Ambulatory Visit (HOSPITAL_COMMUNITY)
Admission: EM | Admit: 2024-06-27 | Discharge: 2024-06-27 | Disposition: A | Discharge status: Home / Self Care | Attending: Physician Assistant | Admitting: Physician Assistant

## 2024-06-27 DIAGNOSIS — W5501XD Bitten by cat, subsequent encounter: Secondary | ICD-10-CM | POA: Diagnosis not present

## 2024-06-27 DIAGNOSIS — S61551A Open bite of right wrist, initial encounter: Secondary | ICD-10-CM | POA: Diagnosis not present

## 2024-06-27 DIAGNOSIS — W5501XA Bitten by cat, initial encounter: Secondary | ICD-10-CM | POA: Diagnosis not present

## 2024-06-27 NOTE — ED Triage Notes (Signed)
 Pt states that she has a cat bite to her right wrist. X3 days  Pt states that she recently had a telehealth visit 2 days ago. Pt states that she is currently taking antibiotics. Pt states that she isn't sure is cat has rabies. 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

## 2024-06-27 NOTE — Discharge Instructions (Signed)
 Continue the antibiotics that were prescribed.  You are up-to-date on your tetanus.  If there is any concern that the animal has rabies please return and we will start the rabies postexposure prophylaxis at that point.  Please let me know if you have any questions.

## 2024-06-27 NOTE — ED Provider Notes (Signed)
 [facility]    CSN: [id number] Arrival date & time: 06/27/24  1211      History   Chief Complaint Chief Complaint  Patient presents with   Animal Bite    HPI Maureen Henderson is a 68 y.o. female.   Patient presents today for evaluation of cat bite to her right wrist.  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  This is a second person that it had bitten as it had bitten someone else while it was living in the community and so had already been monitored for 10 days without evidence of rabies.  The animal did receive rabies vaccination on 06/16/2024 but they believe this is the first time it had ever been vaccinated and so would not yet have had protective antibodies.  She did a video visit earlier and was given Augmentin  which she has been taking and has noticed that the slight erythema around the wounds has improved.  She denies any fever, nausea, vomiting.  She has never had rabies prophylaxis.  She is up-to-date on tetanus which was last given 08/13/2021.  Patient is she is very concerned as she reads contradicting things online and is unsure whether or not she should do the rabies postexposure prophylaxis or not.    Past Medical History:  Diagnosis Date   Adenomatous endometrial hyperplasia    age 67 - hysterectomy    Anxiety    Arthritis    Bundle, branch block, left    Cataract    beginnings of per pt   Depression    GERD (gastroesophageal reflux disease)    Hyperlipidemia    no per pt- no meds taken   Hypertension    Melanoma (HCC) 2012   chest   Osteopenia    Osteopenia     Patient Active Problem List   Diagnosis Date Noted   Actinic skin damage 12/06/2022   Inflamed seborrheic keratosis 12/06/2022   Seborrheic keratoses 12/06/2022   Melanocytic nevi of trunk 12/06/2022   Rosacea 12/06/2022   Caregiver stress syndrome 12/17/2021   Other fatigue  12/17/2021   Erythrocytosis 12/17/2021   Elevated hemoglobin (HCC) 07/16/2021   Attention deficit disorder (ADD) 12/22/2019   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      Home Medications    Prior to Admission medications   Medication Sig Start Date End Date Taking? Authorizing Provider  AMBULATORY NON FORMULARY MEDICATION Nifedipine 3% pentoxifyline 5% cream Patient taking differently: as needed. Nifedipine 3% pentoxifyline 5% cream 12/06/22  Yes Gershon Donnice SAUNDERS, DPM  amoxicillin -clavulanate (AUGMENTIN ) 875-125 MG tablet Take 1 tablet by mouth 2 (two) times daily. 06/25/24  Yes Blair, Diane W, FNP  amphetamine -dextroamphetamine  (ADDERALL) 5 MG tablet Take  1/2 to 1 tablet   2 x /day   for ADD, Focus & Concentration Patient taking differently: Take 1.25 mg by mouth daily. 10/10/22  Yes  Tonita Fallow, MD  b complex vitamins capsule Take 1 capsule by mouth 3 (three) times a week.   Yes [provider]  bisoprolol  (ZEBETA ) 10 MG tablet Take 1 tablet (10 mg total) by mouth daily. 02/02/24  Yes Eubanks, Jessica K, NP  Cholecalciferol (VITAMIN D -3) 125 MCG (5000 UT) TABS Take 1 tablet by mouth 3 (three) times a week.   Yes [provider]  estradiol  (ESTRACE ) 0.5 MG tablet TAKE 1/2 TABLET BY MOUTH 3 TIMES A WEEK 08/20/22  Yes Wilkinson, Dana E, NP  Multiple Vitamin (MULTIVITAMIN) tablet Take 1 tablet by mouth 3 (three) times a week.   Yes [provider]  olmesartan  (BENICAR ) 40 MG tablet Take 1 tablet (40 mg total) by mouth daily. 02/02/24  Yes  Eubanks, Jessica K, NP  triamcinolone ointment (KENALOG) 0.1 % 1 Application as needed. 02/09/24  Yes [provider]  venlafaxine  XR (EFFEXOR -XR) 37.5 MG 24 hr capsule Take 1 capsule (37.5 mg total) by mouth daily with breakfast. 12/15/23  Yes Webb, Padonda B, FNP  venlafaxine  XR (EFFEXOR -XR) 75 MG 24 hr capsule Take 1 capsule (75 mg total) by mouth daily. 03/18/24  Yes Caro Harlene POUR, NP  ALPRAZolam  (XANAX ) 0.5 MG tablet Take 1 tablet (0.5 mg total) by mouth at bedtime. Only as needed and try to limit to 5 days a week Patient taking differently: Take 0.5 mg by mouth as needed. Only as needed and try to limit to 5 days a week 11/21/23   Cranford, Tonya, NP  Ascorbic Acid (VITA-C PO) Take 282 mg by mouth 3 (three) times a week.    [provider]  Calcium Citrate-Vitamin D  (CALCIUM CITRATE + PO) Take 400 mcg by mouth as needed. Sporadically    [provider]  Red Yeast Rice Extract (RED YEAST RICE PO) Take 1,200 mg by mouth 3 (three) times a week. Patient not taking: Reported on 06/22/2024    [provider]  tiZANidine  (ZANAFLEX ) 2 MG tablet Take 1 tablet (2 mg total) by mouth every 8 (eight) hours as needed for muscle spasms. 12/28/21   Jeanine Knee, NP    Family History Family History  Problem Relation Age of Onset   Hypertension Mother    Cancer Father    Prostate cancer Father    Breast cancer Sister    Colon polyps Sister    Stroke Brother    Cancer Maternal Grandmother    Heart disease Paternal Grandmother    Stroke Paternal Grandmother    Cancer Paternal Grandfather    Colon cancer Maternal Aunt    Esophageal cancer Neg Hx    Stomach cancer Neg Hx    Rectal cancer Neg Hx     Social History Social History   Tobacco Use   Smoking status: Never   Smokeless tobacco: Never  Vaping Use   Vaping status: Never Used  Substance Use Topics   Alcohol use: Yes    Alcohol/week: 14.0 standard drinks of alcohol    Types: 14 Glasses of wine per  week   Drug use: No     Allergies   Doxycycline   Review of Systems Review of Systems  Constitutional:  Negative for activity change, appetite change, fatigue and fever.  Musculoskeletal:  Negative for arthralgias and myalgias.  Skin:  Positive for wound. Negative for color change.  Neurological:  Negative for weakness and numbness.     Physical Exam Triage Vital Signs ED Triage Vitals  Encounter Vitals Group  BP 06/27/24 1418 (!) 179/110     Girls Systolic BP Percentile --      Girls Diastolic BP Percentile --      Boys Systolic BP Percentile --      Boys Diastolic BP Percentile --      Pulse Rate 06/27/24 1418 (!) 55     Resp 06/27/24 1418 17     Temp 06/27/24 1418 97.7 F (36.5 C)     Temp Source 06/27/24 1418 Oral     SpO2 06/27/24 1418 96 %     Weight 06/27/24 1416 180 lb (81.6 kg)     Height 06/27/24 1416 5' 4.5 (1.638 m)     Head Circumference --      Peak Flow --      Pain Score 06/27/24 1416 0     Pain Loc --      Pain Education --      Exclude from Growth Chart --    No data found.  Updated Vital Signs BP (!) 154/92 (BP Location: Right Arm)   Pulse (!) 55   Temp 97.7 F (36.5 C) (Oral)   Resp 17   Ht 5' 4.5 (1.638 m)   Wt 180 lb (81.6 kg)   SpO2 96%   BMI 30.42 kg/m   Visual Acuity Right Eye Distance:   Left Eye Distance:   Bilateral Distance:    Right Eye Near:   Left Eye Near:    Bilateral Near:     Physical Exam Vitals reviewed.  Constitutional:      General: She is awake. She is not in acute distress.    Appearance: Normal appearance. She is well-developed. She is not ill-appearing.     Comments: Very pleasant female appears stated age in no acute distress sitting comfortable in exam room  HENT:     Head: Normocephalic and atraumatic.  Cardiovascular:     Rate and Rhythm: Normal rate and regular rhythm.     Heart sounds: Normal heart sounds, S1 normal and S2 normal. No murmur heard. Pulmonary:     Effort: Pulmonary effort  is normal.     Breath sounds: Normal breath sounds. No wheezing, rhonchi or rales.     Comments: Clear to auscultation bilaterally Skin:    Findings: Wound present.     Comments: Scattered wounds on right wrist/forearm measuring between 2 mm and 5 mm with no bleeding or drainage.  No streaking or evidence of lymphangitis.  Psychiatric:        Behavior: Behavior is cooperative.         UC Treatments / Results  Labs (all labs ordered are listed, but only abnormal results are displayed) Labs Reviewed - No data to display  EKG   Radiology No results found.  Procedures Procedures (including critical care time)  Medications Ordered in UC Medications - No data to display  Initial Impression / Assessment and Plan / UC Course  I have reviewed the triage vital signs and the nursing notes.  Pertinent labs & imaging results that were available during my care of the patient were reviewed by me and considered in my medical decision making (see chart for details).     Patient is well-appearing, afebrile, nontoxic, nontachycardic.  We had a long conversation about the risks and benefits of postexposure prophylaxis.  The animal that bit her is currently being monitored by animal control so we elected not to initiate postexposure prophylaxis.  I did discuss case with Dr. Kriste as this  is the second person that this animal has been but she agreed that it is not necessary to initiate this today.  She has already been prescribed Augmentin  and was encouraged to complete course of medication.  She is up-to-date on tetanus.  Form was completed and will be sent to animal control and encouraged her to reach out to them for any additional recommendations.  If she has any concerns or would like to initiate postexposure prophylaxis she is to return to our clinic.  Final Clinical Impressions(s) / UC Diagnoses   Final diagnoses:  Open wound of right wrist due to cat bite  Cat bite, subsequent  encounter     Discharge Instructions      Continue the antibiotics that were prescribed.  You are up-to-date on your tetanus.  If there is any concern that the animal has rabies please return and we will start the rabies postexposure prophylaxis at that point.  Please let me know if you have any questions.     ED Prescriptions   None    PDMP not reviewed this encounter.   Sherrell Rocky POUR, PA-C 06/27/24 1538

## 2024-07-02 ENCOUNTER — Telehealth: Payer: Self-pay | Admitting: Pediatrics

## 2024-07-02 NOTE — Telephone Encounter (Signed)
 Inbound call from patient stating that she is scheduled for a colonoscopy on 9/18 at 3:00 and wanted to let us  know she has been on an antibiotic and should be finished 3 days before the procedure. Please advise.

## 2024-07-02 NOTE — Telephone Encounter (Signed)
 Pt called to let us  know that she is currently on and antibiotic due to animal bite.

## 2024-07-02 NOTE — Telephone Encounter (Signed)
 Informed pt that she can continue med with no issue. No other questions at this time.

## 2024-07-06 NOTE — Progress Notes (Unsigned)
  Gastroenterology History and Physical   Primary Care Physician:  Caro Harlene POUR, NP   Reason for Procedure:  History of colon tubular adenomas and sessile serrated polyps  Plan:    Surveillance colonoscopy    HPI: Maureen Henderson is a 68 y.o. female undergoing valence colonoscopy for history of colon tubular adenomas and sessile serrated polyps.  Last colonoscopy was performed in 2021 and disclosed 9 tubular adenomas and sessile serrated polyps-remainder of polyps were hyperplastic.  Prior colonoscopy in 2015 also disclosed 2 large sessile serrated polyps 10 to 15 mm.  There is a reported history of colon cancer in a maternal aunt.  Patient denies symptoms of rectal bleeding or change in bowel habits at the time of this exam.   Past Medical History:  Diagnosis Date   Adenomatous endometrial hyperplasia    age 21 - hysterectomy    Anxiety    Arthritis    Bundle, branch block, left    Cataract    beginnings of per pt   Depression    GERD (gastroesophageal reflux disease)    Hyperlipidemia    no per pt- no meds taken   Hypertension    Melanoma (HCC) 2012   chest   Osteopenia    Osteopenia     Past Surgical History:  Procedure Laterality Date   COLONOSCOPY     MELANOMA EXCISION     chest   PARTIAL HYSTERECTOMY  30   uterine polyps   POLYPECTOMY     TONSILLECTOMY      Prior to Admission medications   Medication Sig Start Date End Date Taking? Authorizing Provider  ALPRAZolam  (XANAX ) 0.5 MG tablet Take 1 tablet (0.5 mg total) by mouth at bedtime. Only as needed and try to limit to 5 days a week Patient taking differently: Take 0.5 mg by mouth as needed. Only as needed and try to limit to 5 days a week 11/21/23   Cranford, Tonya, NP  AMBULATORY NON FORMULARY MEDICATION Nifedipine 3% pentoxifyline 5% cream Patient taking differently: as needed. Nifedipine 3% pentoxifyline 5% cream 12/06/22   Gershon Donnice SAUNDERS, DPM  amoxicillin -clavulanate (AUGMENTIN ) 875-125  MG tablet Take 1 tablet by mouth 2 (two) times daily. 06/25/24   Blair, Diane W, FNP  amphetamine -dextroamphetamine  (ADDERALL) 5 MG tablet Take  1/2 to 1 tablet   2 x /day   for ADD, Focus & Concentration Patient taking differently: Take 1.25 mg by mouth daily. 10/10/22   Tonita Fallow, MD  Ascorbic Acid (VITA-C PO) Take 282 mg by mouth 3 (three) times a week.    [provider]  b complex vitamins capsule Take 1 capsule by mouth 3 (three) times a week.    [provider]  bisoprolol  (ZEBETA ) 10 MG tablet Take 1 tablet (10 mg total) by mouth daily. 02/02/24   Caro Harlene POUR, NP  Calcium Citrate-Vitamin D  (CALCIUM CITRATE + PO) Take 400 mcg by mouth as needed. Sporadically    [provider]  Cholecalciferol (VITAMIN D -3) 125 MCG (5000 UT) TABS Take 1 tablet by mouth 3 (three) times a week.    [provider]  estradiol  (ESTRACE ) 0.5 MG tablet TAKE 1/2 TABLET BY MOUTH 3 TIMES A WEEK 08/20/22   Wilkinson, Dana E, NP  Multiple Vitamin (MULTIVITAMIN) tablet Take 1 tablet by mouth 3 (three) times a week.    [provider]  olmesartan  (BENICAR ) 40 MG tablet Take 1 tablet (40 mg total) by mouth daily. 02/02/24   Caro Harlene POUR,  NP  Red Yeast Rice Extract (RED YEAST RICE PO) Take 1,200 mg by mouth 3 (three) times a week. Patient not taking: Reported on 06/22/2024    [provider]  tiZANidine  (ZANAFLEX ) 2 MG tablet Take 1 tablet (2 mg total) by mouth every 8 (eight) hours as needed for muscle spasms. 12/28/21   Jeanine Knee, NP  triamcinolone ointment (KENALOG) 0.1 % 1 Application as needed. 02/09/24   [provider]  venlafaxine  XR (EFFEXOR -XR) 37.5 MG 24 hr capsule Take 1 capsule (37.5 mg total) by mouth daily with breakfast. 12/15/23   Webb, Padonda B, FNP  venlafaxine  XR (EFFEXOR -XR) 75 MG 24 hr capsule Take 1 capsule (75 mg total) by mouth daily. 03/18/24   Caro Harlene POUR, NP    Current Outpatient Medications  Medication Sig  Dispense Refill   AMBULATORY NON FORMULARY MEDICATION Nifedipine 3% pentoxifyline 5% cream (Patient taking differently: as needed. Nifedipine 3% pentoxifyline 5% cream) 60 g PRN   amphetamine -dextroamphetamine  (ADDERALL) 5 MG tablet Take  1/2 to 1 tablet   2 x /day   for ADD, Focus & Concentration (Patient taking differently: Take 1.25 mg by mouth daily.) 180 tablet 0   b complex vitamins capsule Take 1 capsule by mouth 3 (three) times a week.     bisoprolol  (ZEBETA ) 10 MG tablet Take 1 tablet (10 mg total) by mouth daily. 90 tablet 1   Cholecalciferol (VITAMIN D -3) 125 MCG (5000 UT) TABS Take 1 tablet by mouth 3 (three) times a week.     estradiol  (ESTRACE ) 0.5 MG tablet TAKE 1/2 TABLET BY MOUTH 3 TIMES A WEEK 90 tablet 3   Multiple Vitamin (MULTIVITAMIN) tablet Take 1 tablet by mouth 3 (three) times a week.     olmesartan  (BENICAR ) 40 MG tablet Take 1 tablet (40 mg total) by mouth daily. 90 tablet 1   triamcinolone ointment (KENALOG) 0.1 % 1 Application as needed.     venlafaxine  XR (EFFEXOR -XR) 37.5 MG 24 hr capsule Take 1 capsule (37.5 mg total) by mouth daily with breakfast. 30 capsule 2   venlafaxine  XR (EFFEXOR -XR) 75 MG 24 hr capsule Take 1 capsule (75 mg total) by mouth daily. 90 capsule 1   ALPRAZolam  (XANAX ) 0.5 MG tablet Take 1 tablet (0.5 mg total) by mouth at bedtime. Only as needed and try to limit to 5 days a week (Patient taking differently: Take 0.5 mg by mouth as needed. Only as needed and try to limit to 5 days a week) 25 tablet 0   Ascorbic Acid (VITA-C PO) Take 282 mg by mouth 3 (three) times a week.     Calcium Citrate-Vitamin D  (CALCIUM CITRATE + PO) Take 400 mcg by mouth as needed. Sporadically     Red Yeast Rice Extract (RED YEAST RICE PO) Take 1,200 mg by mouth 3 (three) times a week. (Patient not taking: Reported on 06/22/2024)     tiZANidine  (ZANAFLEX ) 2 MG tablet Take 1 tablet (2 mg total) by mouth every 8 (eight) hours as needed for muscle spasms. 30 tablet 0   Current  Facility-Administered Medications  Medication Dose Route Frequency Provider Last Rate Last Admin   0.9 %  sodium chloride  infusion  500 mL Intravenous Once Terrionna Bridwell M, MD        Allergies as of 07/08/2024 - Review Complete 07/08/2024  Allergen Reaction Noted   Doxycycline Hives 08/30/2013    Family History  Problem Relation Age of Onset   Hypertension Mother    Cancer Father  Prostate cancer Father    Breast cancer Sister    Colon polyps Sister    Stroke Brother    Cancer Maternal Grandmother    Heart disease Paternal Grandmother    Stroke Paternal Grandmother    Cancer Paternal Grandfather    Colon cancer Maternal Aunt    Esophageal cancer Neg Hx    Stomach cancer Neg Hx    Rectal cancer Neg Hx     Social History   Socioeconomic History   Marital status: Married    Spouse name: Not on file   Number of children: Not on file   Years of education: Not on file   Highest education level: Bachelor's degree (e.g., BA, AB, BS)  Occupational History   Not on file  Tobacco Use   Smoking status: Never   Smokeless tobacco: Never  Vaping Use   Vaping status: Never Used  Substance and Sexual Activity   Alcohol use: Yes    Alcohol/week: 14.0 standard drinks of alcohol    Types: 14 Glasses of wine per week   Drug use: No   Sexual activity: Not Currently    Birth control/protection: Surgical  Other Topics Concern   Not on file  Social History Narrative   Not on file   Social Drivers of Health   Financial Resource Strain: Low Risk  (12/21/2023)   Overall Financial Resource Strain (CARDIA)    Difficulty of Paying Living Expenses: Not hard at all  Food Insecurity: No Food Insecurity (12/21/2023)   Hunger Vital Sign    Worried About Running Out of Food in the Last Year: Never true    Ran Out of Food in the Last Year: Never true  Transportation Needs: No Transportation Needs (12/21/2023)   PRAPARE - Administrator, Civil Service (Medical): No    Lack of  Transportation (Non-Medical): No  Physical Activity: Insufficiently Active (12/21/2023)   Exercise Vital Sign    Days of Exercise per Week: 3 days    Minutes of Exercise per Session: 10 min  Stress: Stress Concern Present (12/21/2023)   [employer] of Occupational Health - Occupational Stress Questionnaire    Feeling of Stress : Very much  Social Connections: Unknown (12/21/2023)   Social Connection and Isolation Panel    Frequency of Communication with Friends and Family: Three times a week    Frequency of Social Gatherings with Friends and Family: Patient declined    Attends Religious Services: Patient declined    Database administrator or Organizations: Patient declined    Attends Engineer, structural: Not on file    Marital Status: Married  Catering manager Violence: Not on file    Review of Systems:  All other review of systems negative except as mentioned in the HPI.  Physical Exam: Vital signs BP 137/75   Pulse (!) 57   Temp (!) 97.1 F (36.2 C)   Resp (!) 0   Ht 5' 5 (1.651 m)   Wt 180 lb (81.6 kg)   SpO2 98%   BMI 29.95 kg/m   General:   Alert,  Well-developed, well-nourished, pleasant and cooperative in NAD Airway:  Mallampati 2 Lungs:  Clear throughout to auscultation.   Heart:  Regular rate and rhythm; no murmurs, clicks, rubs,  or gallops. Abdomen:  Soft, nontender and nondistended. Normal bowel sounds.   Neuro/Psych:  Normal mood and affect. A and O x 3  Inocente Hausen, MD [facility]astroenter[facility]

## 2024-07-08 ENCOUNTER — Ambulatory Visit: Admitting: Pediatrics

## 2024-07-08 ENCOUNTER — Encounter: Payer: Self-pay | Admitting: Pediatrics

## 2024-07-08 VITALS — BP 126/79 | HR 54 | Temp 97.1°F | Resp 11 | Ht 65.0 in | Wt 180.0 lb

## 2024-07-08 DIAGNOSIS — D124 Benign neoplasm of descending colon: Secondary | ICD-10-CM | POA: Diagnosis not present

## 2024-07-08 DIAGNOSIS — Z860101 Personal history of adenomatous and serrated colon polyps: Secondary | ICD-10-CM

## 2024-07-08 DIAGNOSIS — D128 Benign neoplasm of rectum: Secondary | ICD-10-CM

## 2024-07-08 DIAGNOSIS — D123 Benign neoplasm of transverse colon: Secondary | ICD-10-CM | POA: Diagnosis not present

## 2024-07-08 DIAGNOSIS — K621 Rectal polyp: Secondary | ICD-10-CM | POA: Diagnosis not present

## 2024-07-08 DIAGNOSIS — Z1211 Encounter for screening for malignant neoplasm of colon: Secondary | ICD-10-CM

## 2024-07-08 DIAGNOSIS — Z800 Family history of malignant neoplasm of digestive organs: Secondary | ICD-10-CM

## 2024-07-08 DIAGNOSIS — D125 Benign neoplasm of sigmoid colon: Secondary | ICD-10-CM | POA: Diagnosis not present

## 2024-07-08 DIAGNOSIS — K635 Polyp of colon: Secondary | ICD-10-CM

## 2024-07-08 MED ORDER — SODIUM CHLORIDE 0.9 % IV SOLN: 500.0000 mL | Freq: Once | INTRAVENOUS | Status: DC

## 2024-07-08 NOTE — Op Note (Signed)
 [facility] Patient Name: Maureen Henderson Procedure Date: 07/08/2024 3:08 PM MRN: [id number] Endoscopist: Inocente Hausen , MD, [id number] Age: 68 Referring MD:  Date of Birth: 02/08/1956 Gender: Female Account #: [id number] Procedure:                Colonoscopy Indications:              High risk colon cancer surveillance: Personal                            history of multiple (3 or more) adenomas, High risk                            colon cancer surveillance: Personal history of                            sessile serrated colon polyp (10 mm or greater in                            size) Medicines:                Monitored Anesthesia Care Procedure:                Pre-Anesthesia Assessment:                           - Prior to the procedure, a History and Physical                            was performed, and patient medications and                            allergies were reviewed. The patient's tolerance of                            previous anesthesia was also reviewed. The risks                            and benefits of the procedure and the sedation                            options and risks were discussed with the patient.                            All questions were answered, and informed consent                            was obtained. Prior Anticoagulants: The patient has                            taken no anticoagulant or antiplatelet agents. ASA                            Grade Assessment: II - A patient with mild systemic  disease. After reviewing the risks and benefits,                            the patient was deemed in satisfactory condition to                            undergo the procedure.                           After obtaining informed consent, the colonoscope                            was passed under direct vision. Throughout the                            procedure, the patient's blood pressure, pulse, and                             oxygen saturations were monitored continuously. The                            Olympus CF-HQ190L ([id number]) Colonoscope was                            introduced through the anus and advanced to the                            cecum, identified by appendiceal orifice and                            ileocecal valve. The colonoscopy was performed                            without difficulty. The patient tolerated the                            procedure well. The quality of the bowel                            preparation was adequate. Scope In: 3:38:57 PM Scope Out: 4:07:39 PM Scope Withdrawal Time: 0 hours 20 minutes 30 seconds  Total Procedure Duration: 0 hours 28 minutes 42 seconds  Findings:                 The perianal and digital rectal examinations were                            normal. Pertinent negatives include normal                            sphincter tone and no palpable rectal lesions.                           Five sessile polyps were found in the rectum,  sigmoid colon, descending colon and transverse                            colon. The polyps were 5 to 6 mm in size. These                            polyps were removed with a cold snare. Resection                            and retrieval were complete.                           Multiple hyperplastic appearing polyps were seen in                            the rectum. The polyps were 2 to 4 mm in size. Two                            of these polyps were removed with a cold biopsy                            forceps. Resection and retrieval were complete.                           The retroflexed view of the distal rectum and anal                            verge was normal and showed no anal or rectal                            abnormalities. Complications:            No immediate complications. Estimated blood loss:                            Minimal. Estimated Blood Loss:      Estimated blood loss was minimal. Impression:               - Five 5 to 6 mm polyps in the rectum, in the                            sigmoid colon, in the descending colon and in the                            transverse colon, removed with a cold snare.                            Resected and retrieved.                           - Two 2 to 4 mm polyps in the rectum, removed with  a cold biopsy forceps. Resected and retrieved.                            Suspect hyperplastic polyps. Recommendation:           - Discharge patient to home (ambulatory).                           - Await pathology results.                           - Repeat colonoscopy for surveillance based on                            pathology results. Pending pathology results will                            discuss with patient possibility of referral for                            genetic counseling given lifetime history of more                            than 10 adenomatous colon polyps.                           - The findings and recommendations were discussed                            with the patient's family.                           - Return to referring physician.                           - Patient has a contact number available for                            emergencies. The signs and symptoms of potential                            delayed complications were discussed with the                            patient. Return to normal activities tomorrow.                            Written discharge instructions were provided to the                            patient. Inocente Hausen, MD 07/08/2024 4:15:41 PM This report has been signed electronically.

## 2024-07-08 NOTE — Patient Instructions (Signed)
 Resume previous diet. Continue present medications. Awaiting pathology results.  Repeat colonoscopy for surveillance based on pathology results. Handout provided on polyps.  YOU HAD AN ENDOSCOPIC PROCEDURE TODAY AT THE [facility]:   Refer to the procedure report that was given to you for any specific questions about what was found during the examination.  If the procedure report does not answer your questions, please call your gastroenterologist to clarify.  If you requested that your care partner not be given the details of your procedure findings, then the procedure report has been included in a sealed envelope for you to review at your convenience later.  YOU SHOULD EXPECT: Some feelings of bloating in the abdomen. Passage of more gas than usual.  Walking can help get rid of the air that was put into your GI tract during the procedure and reduce the bloating. If you had a lower endoscopy (such as a colonoscopy or flexible sigmoidoscopy) you may notice spotting of blood in your stool or on the toilet paper. If you underwent a bowel prep for your procedure, you may not have a normal bowel movement for a few days.  Please Note:  You might notice some irritation and congestion in your nose or some drainage.  This is from the oxygen used during your procedure.  There is no need for concern and it should clear up in a day or so.  SYMPTOMS TO REPORT IMMEDIATELY:  Following lower endoscopy (colonoscopy or flexible sigmoidoscopy):  Excessive amounts of blood in the stool  Significant tenderness or worsening of abdominal pains  Swelling of the abdomen that is new, acute  Fever of 100F or higher  For urgent or emergent issues, a gastroenterologist can be reached at any hour by calling [phone]. Do not use MyChart messaging for urgent concerns.    DIET:  We do recommend a small meal at first, but then you may proceed to your regular diet.  Drink plenty of fluids but you should  avoid alcoholic beverages for 24 hours.  ACTIVITY:  You should plan to take it easy for the rest of today and you should NOT DRIVE or use heavy machinery until tomorrow (because of the sedation medicines used during the test).    FOLLOW UP: Our staff will call the number listed on your records the next business day following your procedure.  We will call around 7:15- 8:00 am to check on you and address any questions or concerns that you may have regarding the information given to you following your procedure. If we do not reach you, we will leave a message.     If any biopsies were taken you will be contacted by phone or by letter within the next 1-3 weeks.  Please call us  at [phone] if you have not heard about the biopsies in 3 weeks.    SIGNATURES/CONFIDENTIALITY: You and/or your care partner have signed paperwork which will be entered into your electronic medical record.  These signatures attest to the fact that that the information above on your After Visit Summary has been reviewed and is understood.  Full responsibility of the confidentiality of this discharge information lies with you and/or your care-partner.

## 2024-07-08 NOTE — Progress Notes (Signed)
 Called to room to assist during endoscopic procedure.  Patient ID and intended procedure confirmed with present staff. Received instructions for my participation in the procedure from the performing physician.

## 2024-07-08 NOTE — Progress Notes (Signed)
 Sedate, gd SR, tolerated procedure well, VSS, report to RN

## 2024-07-09 ENCOUNTER — Telehealth: Payer: Self-pay | Admitting: Lactation Services

## 2024-07-09 NOTE — Telephone Encounter (Signed)
  Follow up Call-     07/08/2024    3:17 PM  Call back number  Post procedure Call Back phone  # [phone]  Permission to leave phone message Yes     Patient questions:  Do you have a fever, pain , or abdominal swelling? Yes.   Pain Score  0 *  Have you tolerated food without any problems? Yes.    Have you been able to return to your normal activities? Yes.    Do you have any questions about your discharge instructions: Diet   No. Medications  No. Follow up visit  No.  Do you have questions or concerns about your Care? No.  Actions: * If pain score is 4 or above: No action needed, pain <4.

## 2024-07-09 NOTE — Progress Notes (Signed)
 Follow up phone call today after patient's colonoscopy yesterday.  Patient has been able to return to eating and drinking; no difficulty passing gas.  However, she reported a low grade fever.  Suggested she take Tylenol and hydrate; suggested she follow up with her doctor if she is not feeling better by late morning today.  Patient verbalized understanding.

## 2024-07-13 ENCOUNTER — Ambulatory Visit: Payer: Self-pay | Admitting: Pediatrics

## 2024-07-13 LAB — SURGICAL PATHOLOGY

## 2024-07-14 ENCOUNTER — Telehealth: Payer: Self-pay | Admitting: Pediatrics

## 2024-07-14 NOTE — Telephone Encounter (Signed)
 Inbound call from patient wanting to speak to nurse in regards to pathology report from procedure done on 07/08/24  Requesting a call back  Please advise Thank you

## 2024-07-14 NOTE — Telephone Encounter (Signed)
 Per 07/08/24 pathology result note:  The polyps removed were sessile serrated polyps, a lymphoid aggregate and hyperplastic polyps.  While sessile serrated polyps are benign type of polyp, they are considered precancerous in nature.  That means that these polyps could have turned into colon cancer they not been removed.  Lymphoid aggregates and hyperplastic polyps are entirely benign and have no malignant potential.   Based upon this finding, I recommend that you undergo your next surveillance colonoscopy in 3 years.   As we discussed at the time of your procedure, you are a candidate for genetic screening for colon polyps syndromes given your lifetime history of 10 or more adenomatous colon polyps.  If you would like to pursue consultation with the medical genetics office, please let my office know and we can coordinate a referral for you.

## 2024-07-14 NOTE — Telephone Encounter (Signed)
 Called and reviewed results with patient. Patient is not interested in genetic referral at this time. Patient wanted to clarify how many and what types of polyps were found at this colonoscopy vs 2021 colonoscopy. We discussed the different types of polyps found.Patient states that she did have a low grade fever after procedure. The fever was resolved the next day. No other symptoms noted. Patient wondered if low grade fever was related to polyp removal, advised that there were no complications during her procedure. Patient is aware that it is possible that anesthesia could have caused it. Patient states that she has never had that reaction before but would like to discuss further with anesthesia team. Informed patient that I will have a CRNA reach out to her to discuss further. Patient was thankful for the information and verbalized understanding.

## 2024-07-21 NOTE — Progress Notes (Signed)
 Maureen Henderson                                          MRN: [id number]   07/21/2024   The [facility] Quality Team Specialist reviewed this patient medical record for the purposes of chart review for care gap closure. The following were reviewed: chart review for care gap closure-controlling blood pressure.    [facility] Quality Team

## 2024-08-12 DIAGNOSIS — S2231XA Fracture of one rib, right side, initial encounter for closed fracture: Secondary | ICD-10-CM | POA: Diagnosis not present

## 2024-08-12 DIAGNOSIS — M25511 Pain in right shoulder: Secondary | ICD-10-CM | POA: Diagnosis not present

## 2024-08-12 DIAGNOSIS — M8589 Other specified disorders of bone density and structure, multiple sites: Secondary | ICD-10-CM | POA: Diagnosis not present

## 2024-08-12 DIAGNOSIS — R0789 Other chest pain: Secondary | ICD-10-CM | POA: Diagnosis not present

## 2024-08-23 ENCOUNTER — Ambulatory Visit: Payer: Self-pay | Admitting: Nurse Practitioner

## 2024-08-30 ENCOUNTER — Encounter: Payer: Medicare Other | Admitting: Nurse Practitioner

## 2024-09-01 ENCOUNTER — Encounter: Payer: Self-pay | Admitting: Nurse Practitioner

## 2024-09-01 ENCOUNTER — Ambulatory Visit (INDEPENDENT_AMBULATORY_CARE_PROVIDER_SITE_OTHER): Admitting: Nurse Practitioner

## 2024-09-01 VITALS — BP 126/84 | HR 67 | Temp 97.8°F | Ht 65.0 in | Wt 184.0 lb

## 2024-09-01 DIAGNOSIS — F322 Major depressive disorder, single episode, severe without psychotic features: Secondary | ICD-10-CM

## 2024-09-01 DIAGNOSIS — F411 Generalized anxiety disorder: Secondary | ICD-10-CM

## 2024-09-01 DIAGNOSIS — F900 Attention-deficit hyperactivity disorder, predominantly inattentive type: Secondary | ICD-10-CM | POA: Diagnosis not present

## 2024-09-01 DIAGNOSIS — I10 Essential (primary) hypertension: Secondary | ICD-10-CM

## 2024-09-01 DIAGNOSIS — Z79890 Hormone replacement therapy (postmenopausal): Secondary | ICD-10-CM | POA: Diagnosis not present

## 2024-09-01 LAB — COMPREHENSIVE METABOLIC PANEL WITH GFR
AG Ratio: 1.8 (calc) (ref 1.0–2.5)
ALT: 24 U/L (ref 6–29)
AST: 14 U/L (ref 10–35)
Albumin: 4.6 g/dL (ref 3.6–5.1)
Alkaline phosphatase (APISO): 88 U/L (ref 37–153)
BUN: 17 mg/dL (ref 7–25)
CO2: 29 mmol/L (ref 20–32)
Calcium: 10 mg/dL (ref 8.6–10.4)
Chloride: 98 mmol/L (ref 98–110)
Creat: 0.63 mg/dL (ref 0.50–1.05)
Globulin: 2.5 g/dL (ref 1.9–3.7)
Glucose, Bld: 67 mg/dL (ref 65–139)
Potassium: 4.3 mmol/L (ref 3.5–5.3)
Sodium: 135 mmol/L (ref 135–146)
Total Bilirubin: 0.3 mg/dL (ref 0.2–1.2)
Total Protein: 7.1 g/dL (ref 6.1–8.1)
eGFR: 97 mL/min/1.73m2 (ref >=60)

## 2024-09-01 LAB — CBC WITH DIFFERENTIAL/PLATELET
Absolute Lymphocytes: 1909 {cells}/uL (ref 850–3900)
Absolute Monocytes: 722 {cells}/uL (ref 200–950)
Basophils Absolute: 50 {cells}/uL (ref 0–200)
Basophils Relative: 0.6 %
Eosinophils Absolute: 125 {cells}/uL (ref 15–500)
Eosinophils Relative: 1.5 %
HCT: 48.2 % — ABNORMAL HIGH (ref 35.0–45.0)
Hemoglobin: 16.1 g/dL — ABNORMAL HIGH (ref 11.7–15.5)
MCH: 30.9 pg (ref 27.0–33.0)
MCHC: 33.4 g/dL (ref 32.0–36.0)
MCV: 92.5 fL (ref 80.0–100.0)
MPV: 9.4 fL (ref 7.5–12.5)
Monocytes Relative: 8.7 %
Neutro Abs: 5495 {cells}/uL (ref 1500–7800)
Neutrophils Relative %: 66.2 %
Platelets: 360 Thousand/uL (ref 140–400)
RBC: 5.21 Million/uL — ABNORMAL HIGH (ref 3.80–5.10)
RDW: 11.7 % (ref 11.0–15.0)
Total Lymphocyte: 23 %
WBC: 8.3 Thousand/uL (ref 3.8–10.8)

## 2024-09-01 MED ORDER — AMPHETAMINE-DEXTROAMPHETAMINE 5 MG PO TABS: ORAL_TABLET | ORAL | 0 refills | Status: AC

## 2024-09-01 NOTE — Patient Instructions (Signed)
 Try to wean off estrace  due to side effects

## 2024-09-01 NOTE — Progress Notes (Signed)
 Careteam: Patient Care Team: Caro Harlene POUR, NP as PCP - General (Geriatric Medicine) Rutherford Gain, MD as Consulting Physician (Obstetrics and Gynecology) Ladona Heinz, MD as Consulting Physician (Cardiology) Obie Princella HERO, MD (Inactive) as Consulting Physician (Gastroenterology) Robinson Pao, MD as Consulting Physician (Dermatology) Tommas Pears, MD as Consulting Physician (Endocrinology)  PLACE OF SERVICE:  [facility]  Advanced Directive information    Allergies  Allergen Reactions   Doxycycline Hives    Chief Complaint  Patient presents with   Medical Management of Chronic Issues    3 month routine follow up  - Concers about cutting down to 1 bp medication instead of 2 Pt had flu vaccine  And will be getting COVID vaccine this weekend and will be getting Shingles vaccine.     HPI:  Pt is here for follow up.  She has HTN- she is on olmesartan  and bisoprolol  for bp  Hx of hysterectomy in her 30s- was estrace  0.5 mg half tablet three times a week.   Anxiety and depression- she is taking Effexor  37.5 mg with 75 mg daily-  Reports it helps her with the depression but when she increased to 150 mg it made her jittery  She tried to wean herself off but get brain zaps and side effects She has used alprazolam  in the past but not needed- no Rx for it now.   Reports she has had flu and pneumonia 23 vaccine- wants to hold off on getting prenar 20  Has not gotten RSV vaccine.   She is taking adderall she reports she breaks a tablet into fourths. Has side effects from taking more but really helps her with focus.   Reports she wants to take the smallest amount of medication as possible.   Review of Systems:  Review of Systems  Constitutional:  Negative for chills, fever and weight loss.  HENT:  Negative for tinnitus.   Respiratory:  Negative for cough, sputum production and shortness of breath.   Cardiovascular:  Negative for chest pain, palpitations and leg  swelling.  Gastrointestinal:  Negative for abdominal pain, constipation, diarrhea and heartburn.  Genitourinary:  Negative for dysuria, frequency and urgency.  Musculoskeletal:  Negative for back pain, falls, joint pain and myalgias.  Skin: Negative.   Neurological:  Negative for dizziness and headaches.  Psychiatric/Behavioral:  Positive for depression. Negative for memory loss. The patient is nervous/anxious. The patient does not have insomnia.     Past Medical History:  Diagnosis Date   Adenomatous endometrial hyperplasia    age 46 - hysterectomy    Anxiety    Arthritis    Bundle, branch block, left    Cataract    beginnings of per pt   Depression    GERD (gastroesophageal reflux disease)    Hyperlipidemia    no per pt- no meds taken   Hypertension    Melanoma (HCC) 2012   chest   Osteopenia    Osteopenia    Past Surgical History:  Procedure Laterality Date   COLONOSCOPY     MELANOMA EXCISION     chest   PARTIAL HYSTERECTOMY  30   uterine polyps   POLYPECTOMY     TONSILLECTOMY     Social History:   reports that she has never smoked. She has never used smokeless tobacco. She reports current alcohol use of about 12.0 standard drinks of alcohol per week. She reports that she does not use drugs.  Family History  Problem Relation Age of Onset  Hypertension Mother    Cancer Father    Prostate cancer Father    Breast cancer Sister    Colon polyps Sister    Stroke Brother    Cancer Maternal Grandmother    Heart disease Paternal Grandmother    Stroke Paternal Grandmother    Cancer Paternal Grandfather    Colon cancer Maternal Aunt    Esophageal cancer Neg Hx    Stomach cancer Neg Hx    Rectal cancer Neg Hx     Medications: Patient's Medications  New Prescriptions   No medications on file  Previous Medications   AMPHETAMINE -DEXTROAMPHETAMINE  (ADDERALL) 5 MG TABLET    Take  1/2 to 1 tablet   2 x /day   for ADD, Focus & Concentration   ASCORBIC ACID (VITA-C  PO)    Take 282 mg by mouth 3 (three) times a week.   B COMPLEX VITAMINS CAPSULE    Take 1 capsule by mouth 3 (three) times a week.   BISOPROLOL  (ZEBETA ) 10 MG TABLET    Take 1 tablet (10 mg total) by mouth daily.   CALCIUM CITRATE-VITAMIN D  (CALCIUM CITRATE + PO)    Take 400 mcg by mouth as needed. Sporadically   CHOLECALCIFEROL (VITAMIN D -3) 125 MCG (5000 UT) TABS    Take 1 tablet by mouth 3 (three) times a week.   CYCLOBENZAPRINE  (FLEXERIL ) 5 MG TABLET    Take 5 mg by mouth as needed for muscle spasms.   ESTRADIOL  (ESTRACE ) 0.5 MG TABLET    TAKE 1/2 TABLET BY MOUTH 3 TIMES A WEEK   MULTIPLE VITAMIN (MULTIVITAMIN) TABLET    Take 1 tablet by mouth 3 (three) times a week.   OLMESARTAN  (BENICAR ) 40 MG TABLET    Take 1 tablet (40 mg total) by mouth daily.   VENLAFAXINE  XR (EFFEXOR -XR) 37.5 MG 24 HR CAPSULE    Take 1 capsule (37.5 mg total) by mouth daily with breakfast.   VENLAFAXINE  XR (EFFEXOR -XR) 75 MG 24 HR CAPSULE    Take 1 capsule (75 mg total) by mouth daily.  Modified Medications   No medications on file  Discontinued Medications   ALPRAZOLAM  (XANAX ) 0.5 MG TABLET    Take 1 tablet (0.5 mg total) by mouth at bedtime. Only as needed and try to limit to 5 days a week   AMBULATORY NON FORMULARY MEDICATION    Nifedipine 3% pentoxifyline 5% cream   RED YEAST RICE EXTRACT (RED YEAST RICE PO)    Take 1,200 mg by mouth 3 (three) times a week.   TIZANIDINE  (ZANAFLEX ) 2 MG TABLET    Take 1 tablet (2 mg total) by mouth every 8 (eight) hours as needed for muscle spasms.   TRIAMCINOLONE OINTMENT (KENALOG) 0.1 %    1 Application as needed.    Physical Exam:  Vitals:   09/01/24 1316  BP: 126/84  Pulse: 67  Temp: 97.8 F (36.6 C)  SpO2: 96%  Weight: 184 lb (83.5 kg)  Height: 5' 5 (1.651 m)   Body mass index is 30.62 kg/m. Wt Readings from Last 3 Encounters:  09/01/24 184 lb (83.5 kg)  07/08/24 180 lb (81.6 kg)  06/27/24 180 lb (81.6 kg)    Physical Exam Constitutional:      General:  She is not in acute distress.    Appearance: She is well-developed. She is not diaphoretic.  HENT:     Head: Normocephalic and atraumatic.     Mouth/Throat:     Pharynx: No oropharyngeal exudate.  Eyes:     Conjunctiva/sclera: Conjunctivae normal.     Pupils: Pupils are equal, round, and reactive to light.  Cardiovascular:     Rate and Rhythm: Normal rate and regular rhythm.     Heart sounds: Normal heart sounds.  Pulmonary:     Effort: Pulmonary effort is normal.     Breath sounds: Normal breath sounds.  Abdominal:     General: Bowel sounds are normal.     Palpations: Abdomen is soft.  Musculoskeletal:     Cervical back: Normal range of motion and neck supple.     Right lower leg: No edema.     Left lower leg: No edema.  Skin:    General: Skin is warm and dry.  Neurological:     Mental Status: She is alert.  Psychiatric:        Mood and Affect: Mood normal.     Labs reviewed: Basic Metabolic Panel: Recent Labs    02/02/24 1052  NA 135  K 4.4  CL 99  CO2 29  GLUCOSE 90  BUN 11  CREATININE 0.70  CALCIUM 9.6   Liver Function Tests: Recent Labs    02/02/24 1052  AST 17  ALT 24  BILITOT 0.3  PROT 6.6   No results for input(s): LIPASE, AMYLASE in the last 8760 hours. No results for input(s): AMMONIA in the last 8760 hours. CBC: Recent Labs    02/02/24 1052  WBC 7.1  NEUTROABS 4,523  HGB 15.9*  HCT 48.2*  MCV 94.5  PLT 322   Lipid Panel: Recent Labs    02/02/24 1052  CHOL 215*  HDL 92  LDLCALC 100*  TRIG 135  CHOLHDL 2.3   TSH: No results for input(s): TSH in the last 8760 hours. A1C: Lab Results  Component Value Date   HGBA1C 5.4 08/07/2022     Assessment/Plan 1. Attention deficit hyperactivity disorder (ADHD), predominantly inattentive type Currently on adderall last prescription lasted since 2023 since she forths tablets.  She needs to remain focus but does not like taking more than 1/4 tablet due to side effects -  amphetamine -dextroamphetamine  (ADDERALL) 5 MG tablet; Take quarter tablet daily as needed  Dispense: 30 tablet; Refill: 0  2. Current severe episode of major depressive disorder without psychotic features, unspecified whether recurrent (HCC) (Primary) Overall stable. She would like to decrease effexor  to 75 mg daily, is slowly weaning from the additional 37.5 mg tablet. She is taking every other day at this time. Discussed doing this for 2 weeks then taking every 3 days and continuing to wean.   3. Anxiety state Stable, continues on effexor  with lifestyle modifications.   4. Hormone replacement therapy -discussed stopping estrace  due to risk of adverse effects of ongoing use of estrogen.   5. Primary hypertension -well controlled on current regimen. Discussed continuing current medication at this time.  - CBC with Differential/Platelet - CMP  Return in about 6 months (around 03/01/2025) for routine follow up.  Shereda Graw K. Caro [facility] [phone]

## 2024-09-06 ENCOUNTER — Ambulatory Visit: Payer: Self-pay | Admitting: Nurse Practitioner

## 2024-09-27 NOTE — Telephone Encounter (Signed)
 Reached out to the patient and she states that she does not need the venlafaxine  refilled but she does need a refill on the estradiol  (ESTRACE ) 0.5 MG tablet [

## 2024-09-27 NOTE — Telephone Encounter (Signed)
 Please call and clarify what dose she is taking now- she was weaning medication at last OV

## 2024-09-27 NOTE — Telephone Encounter (Signed)
 We had talked to her about weaning off of this medication as well- how much is she currently taking.

## 2024-09-28 ENCOUNTER — Other Ambulatory Visit: Payer: Self-pay | Admitting: Nurse Practitioner

## 2024-09-28 DIAGNOSIS — F322 Major depressive disorder, single episode, severe without psychotic features: Secondary | ICD-10-CM

## 2024-09-28 DIAGNOSIS — F411 Generalized anxiety disorder: Secondary | ICD-10-CM

## 2024-09-28 NOTE — Telephone Encounter (Signed)
 Patient confirmed that she is taking 1/2 tablet by mouth 3 times a week of the estradiol  0.5mg  tablet. She is requesting a 90 day supply if possible.

## 2024-09-28 NOTE — Telephone Encounter (Signed)
 Left message for patient to return call to office.

## 2024-10-01 NOTE — Telephone Encounter (Signed)
 We discussed her weaning off- would not recommend refill.

## 2024-10-04 NOTE — Telephone Encounter (Signed)
 Reached out to patient and left a detailed voicemail per DPR with providers response.

## 2024-10-29 ENCOUNTER — Encounter: Payer: Self-pay | Admitting: Adult Health

## 2024-10-29 ENCOUNTER — Ambulatory Visit: Admitting: Adult Health

## 2024-10-29 ENCOUNTER — Ambulatory Visit: Payer: Self-pay

## 2024-10-29 ENCOUNTER — Encounter: Payer: Self-pay | Admitting: Nurse Practitioner

## 2024-10-29 VITALS — BP 118/76 | HR 71 | Ht 65.0 in | Wt 190.0 lb

## 2024-10-29 DIAGNOSIS — R7689 Other specified abnormal immunological findings in serum: Secondary | ICD-10-CM | POA: Diagnosis not present

## 2024-10-29 DIAGNOSIS — F322 Major depressive disorder, single episode, severe without psychotic features: Secondary | ICD-10-CM

## 2024-10-29 DIAGNOSIS — F339 Major depressive disorder, recurrent, unspecified: Secondary | ICD-10-CM | POA: Diagnosis not present

## 2024-10-29 DIAGNOSIS — R21 Rash and other nonspecific skin eruption: Secondary | ICD-10-CM

## 2024-10-29 DIAGNOSIS — F900 Attention-deficit hyperactivity disorder, predominantly inattentive type: Secondary | ICD-10-CM | POA: Diagnosis not present

## 2024-10-29 DIAGNOSIS — I10 Essential (primary) hypertension: Secondary | ICD-10-CM | POA: Diagnosis not present

## 2024-10-29 NOTE — Progress Notes (Unsigned)
 "  [facility]  Provider:  Jereld Serum DNP  Code Status: ***  Goals of Care:     02/04/2024   11:09 AM  Advanced Directives  Does Patient Have a Medical Advance Directive? Yes  Type of Estate Agent of [place];Living will  Does patient want to make changes to medical advance directive? No - Patient declined  Copy of Healthcare Power of Attorney in Chart? No - copy requested     Chief Complaint  Patient presents with   Acute Visit    Lesions on toes and fingers. She think she may have chilblains lupus. May need a referral to a rheumatologist     HPI: Patient is a 69 y.o. female seen today for an acute visit for Discussed the use of AI scribe software for clinical note transcription with the patient, who gave verbal consent to proceed.  History of Present Illness      Past Medical History:  Diagnosis Date   Adenomatous endometrial hyperplasia    age 57 - hysterectomy    Anxiety    Arthritis    Bundle, branch block, left    Cataract    beginnings of per pt   Depression    GERD (gastroesophageal reflux disease)    Hyperlipidemia    no per pt- no meds taken   Hypertension    Melanoma (HCC) 2012   chest   Osteopenia    Osteopenia     Past Surgical History:  Procedure Laterality Date   COLONOSCOPY     MELANOMA EXCISION     chest   PARTIAL HYSTERECTOMY  30   uterine polyps   POLYPECTOMY     TONSILLECTOMY      Allergies[1]  Outpatient Encounter Medications as of 10/29/2024  Medication Sig   amphetamine -dextroamphetamine  (ADDERALL) 5 MG tablet Take quarter tablet daily as needed   bisoprolol  (ZEBETA ) 10 MG tablet Take 1 tablet (10 mg total) by mouth daily.   Calcium Citrate-Vitamin D  (CALCIUM CITRATE + PO) Take 400 mcg by mouth as needed. Sporadically (Patient taking differently: Take 400 mcg by mouth. Sporadically / 3 times a week)   Cholecalciferol (VITAMIN D -3) 125 MCG (5000 UT) TABS Take 1 tablet by mouth 3 (three) times a  week.   cyclobenzaprine  (FLEXERIL ) 5 MG tablet Take 5 mg by mouth as needed for muscle spasms.   venlafaxine  XR (EFFEXOR -XR) 37.5 MG 24 hr capsule Take 1 capsule (37.5 mg total) by mouth daily with breakfast.   venlafaxine  XR (EFFEXOR -XR) 75 MG 24 hr capsule Take 1 capsule (75 mg total) by mouth daily.   Ascorbic Acid (VITA-C PO) Take 282 mg by mouth 3 (three) times a week. (Patient not taking: Reported on 10/29/2024)   b complex vitamins capsule Take 1 capsule by mouth 3 (three) times a week. (Patient not taking: Reported on 10/29/2024)   Multiple Vitamin (MULTIVITAMIN) tablet Take 1 tablet by mouth 3 (three) times a week. (Patient not taking: Reported on 10/29/2024)   olmesartan  (BENICAR ) 40 MG tablet Take 1 tablet (40 mg total) by mouth daily. (Patient not taking: Reported on 10/29/2024)   No facility-administered encounter medications on file as of 10/29/2024.    Review of Systems:  Review of Systems  Constitutional:  Negative for appetite change, chills, fatigue and fever.  HENT:  Negative for congestion, hearing loss, rhinorrhea and sore throat.   Eyes: Negative.   Respiratory:  Negative for cough, shortness of breath and wheezing.   Cardiovascular:  Negative for chest pain,  palpitations and leg swelling.  Gastrointestinal:  Negative for abdominal pain, constipation, diarrhea, nausea and vomiting.  Genitourinary:  Negative for dysuria.  Musculoskeletal:  Negative for arthralgias, back pain and myalgias.  Skin:  Negative for color change, rash and wound.  Neurological:  Negative for dizziness, weakness and headaches.  Psychiatric/Behavioral:  Negative for behavioral problems. The patient is not nervous/anxious.     Health Maintenance  Topic Date Due   Zoster Vaccines- Shingrix (1 of 2) 02/[birth year]/1975   Pneumococcal Vaccine: 50+ Years (2 of 2 - PCV) 06/05/2022   COVID-19 Vaccine (3 - 2025-26 season) 06/21/2024   Medicare Annual Wellness (AWV)  02/03/2025   Mammogram  09/02/2025   Colonoscopy   07/09/2027   DTaP/Tdap/Td (3 - Td or Tdap) 08/14/2031   Influenza Vaccine  Completed   Bone Density Scan  Completed   Hepatitis C Screening  Completed   Meningococcal B Vaccine  Aged Out    Physical Exam: Vitals:   10/29/24 1427  BP: 118/76  Pulse: 71  SpO2: 99%  Weight: 190 lb (86.2 kg)  Height: 5' 5 (1.651 m)   Body mass index is 31.62 kg/m. Physical Exam Constitutional:      Appearance: Normal appearance.  HENT:     Head: Normocephalic and atraumatic.     Nose: Nose normal.     Mouth/Throat:     Mouth: Mucous membranes are moist.  Eyes:     Conjunctiva/sclera: Conjunctivae normal.  Cardiovascular:     Rate and Rhythm: Normal rate and regular rhythm.  Pulmonary:     Effort: Pulmonary effort is normal.     Breath sounds: Normal breath sounds.  Abdominal:     General: Bowel sounds are normal.     Palpations: Abdomen is soft.  Musculoskeletal:        General: Normal range of motion.     Cervical back: Normal range of motion.  Skin:    General: Skin is warm and dry.     Findings: Rash present.     Comments: Bilateral hand rashes   Neurological:     General: No focal deficit present.     Mental Status: She is alert and oriented to person, place, and time.  Psychiatric:        Mood and Affect: Mood normal.        Behavior: Behavior normal.     Labs reviewed: Basic Metabolic Panel: Recent Labs    02/02/24 1052 09/01/24 1412  NA 135 135  K 4.4 4.3  CL 99 98  CO2 29 29  GLUCOSE 90 67  BUN 11 17  CREATININE 0.70 0.63  CALCIUM 9.6 10.0   Liver Function Tests: Recent Labs    02/02/24 1052 09/01/24 1412  AST 17 14  ALT 24 24  BILITOT 0.3 0.3  PROT 6.6 7.1   No results for input(s): LIPASE, AMYLASE in the last 8760 hours. No results for input(s): AMMONIA in the last 8760 hours. CBC: Recent Labs    02/02/24 1052 09/01/24 1412  WBC 7.1 8.3  NEUTROABS 4,523 5,495  HGB 15.9* 16.1*  HCT 48.2* 48.2*  MCV 94.5 92.5  PLT 322 360   Lipid  Panel: Recent Labs    02/02/24 1052  CHOL 215*  HDL 92  LDLCALC 100*  TRIG 135  CHOLHDL 2.3   Lab Results  Component Value Date   HGBA1C 5.4 08/07/2022    Procedures since last visit: No results found.  Assessment/Plan    Labs/tests ordered:  No follow-ups on file.  Maureen Granlund Medina-Vargas, NP     [1]  Allergies Allergen Reactions   Doxycycline Hives   "

## 2024-10-29 NOTE — Telephone Encounter (Signed)
 FYI Only or Action Required?: FYI only for provider: appointment scheduled on 01.09.26.  Patient was last seen in primary care on 09/01/2024 by Maureen Harlene POUR, NP.  Called Nurse Triage reporting Blister.  Symptoms began several months ago.  Interventions attempted: OTC medications: Epson Salt.  Symptoms are: gradually worsening.  Triage Disposition: See HCP Within 4 Hours (Or PCP Triage)  Patient/caregiver understands and will follow disposition?: Yes   Message from [place] B sent at 10/29/2024 11:38 AM EST  Summary: [id number]: Maureen Henderson   Reason for Triage: experiencing pain fingertips of both hands and feet. Pt states there's ulcer like appearing on the tips of fingers and toes was told that it wasn't shingles would like to speak with NT before getting setting up for a referral to the rheumatology      Reason for Disposition  Looks like a boil or deep ulcer  Answer Assessment - Initial Assessment Questions 1. APPEARANCE of BLISTER: What does it look like?      Chilblains      2. LOCATION: Where are the blisters located?      Fingers and toes  3. WHEN: When did the blister happen?     Recurrent, x months  4. CAUSE: What do you think caused the blister?      Raynauds  5. PAIN: Does it hurt? If Yes, ask: How bad is the pain?  (Scale 0-10; or none, mild, moderate, severe)      Burns  6. OTHER SYMPTOMS: Do you have any other symptoms? (e.g., fever)      Denies fever  Pt reports Blisters Pt is taking OTC Epson salt Pt scheduled for a visit on  01.09.26 for further evaluation. Pt states she may need a referral to rheumatology for recurrent symptoms Pt agrees with plan of care, will call back for any worsening symptoms  Protocols used: Blister - Foot and Hand-A-AH

## 2024-10-29 NOTE — Telephone Encounter (Signed)
 See triage notes from triage nurse.

## 2024-11-01 ENCOUNTER — Other Ambulatory Visit

## 2024-11-01 ENCOUNTER — Other Ambulatory Visit: Payer: Self-pay

## 2024-11-01 ENCOUNTER — Telehealth: Payer: Self-pay | Admitting: Nurse Practitioner

## 2024-11-01 DIAGNOSIS — R21 Rash and other nonspecific skin eruption: Secondary | ICD-10-CM

## 2024-11-01 NOTE — Telephone Encounter (Signed)
 The patient presented as a walk-in and stated she was previously taking Zepbound , but discontinued use due to lack of insurance coverage. She is inquiring whether Harlene would consider prescribing the new generic Wegovy  oral medication as an alternative.  Please advise?    [id number] [place], [place] - [address] AT [facility] [address] [facility] [address] Phone: [phone]  Fax: [phone]

## 2024-11-01 NOTE — Telephone Encounter (Signed)
 Please see previous telephone encounter from [place], Alondra.  Please Advise    Message sent to Caro Harlene POUR, NP

## 2024-11-02 ENCOUNTER — Encounter: Payer: Self-pay | Admitting: Nurse Practitioner

## 2024-11-02 MED ORDER — ESTRADIOL 0.5 MG PO TABS: ORAL_TABLET | ORAL | 0 refills | Status: AC

## 2024-11-02 NOTE — Telephone Encounter (Signed)
 We would need to have a visit to discuss this.

## 2024-11-02 NOTE — Telephone Encounter (Signed)
 Patient wanted to ask if it is okay to do virtual visit because Caro Harlene POUR, NP schedule is fully book on Monday and Friday but do have openings on Tuesday and Thursday only for a MyChart video visit.   Message sent to Caro Harlene POUR, NP

## 2024-11-02 NOTE — Telephone Encounter (Signed)
 Yes okay for virtual on Tuesday and thursday

## 2024-11-02 NOTE — Telephone Encounter (Signed)
 Noted. Patient has made an appointment

## 2024-11-03 ENCOUNTER — Other Ambulatory Visit: Payer: Self-pay | Admitting: Nurse Practitioner

## 2024-11-03 LAB — SEDIMENTATION RATE: Sed Rate: 2 mm/h (ref 0–30)

## 2024-11-03 LAB — ANA: Anti Nuclear Antibody (ANA): POSITIVE — AB

## 2024-11-03 LAB — ANTI-NUCLEAR AB-TITER (ANA TITER): ANA Titer 1: 1:40 {titer} — ABNORMAL HIGH

## 2024-11-05 ENCOUNTER — Ambulatory Visit: Payer: Self-pay | Admitting: Adult Health

## 2024-11-05 NOTE — Progress Notes (Signed)
 ANA positive -  sedimentation rate normal -  referred to rheumatology

## 2024-11-09 ENCOUNTER — Encounter: Payer: Self-pay | Admitting: Nurse Practitioner

## 2024-11-09 ENCOUNTER — Telehealth: Admitting: Nurse Practitioner

## 2024-11-09 DIAGNOSIS — E6609 Other obesity due to excess calories: Secondary | ICD-10-CM

## 2024-11-09 DIAGNOSIS — R2689 Other abnormalities of gait and mobility: Secondary | ICD-10-CM | POA: Diagnosis not present

## 2024-11-09 DIAGNOSIS — R21 Rash and other nonspecific skin eruption: Secondary | ICD-10-CM | POA: Diagnosis not present

## 2024-11-09 DIAGNOSIS — Z6831 Body mass index (BMI) 31.0-31.9, adult: Secondary | ICD-10-CM | POA: Diagnosis not present

## 2024-11-09 DIAGNOSIS — E66811 Obesity, class 1: Secondary | ICD-10-CM | POA: Diagnosis not present

## 2024-11-09 MED ORDER — WEGOVY 1.5 MG PO TABS: 1.5000 mg | ORAL_TABLET | Freq: Every day | ORAL | 0 refills | Status: AC

## 2024-11-09 NOTE — Progress Notes (Signed)
"  °  This service is provided via telemedicine  No vital signs collected/recorded due to the encounter was a telemedicine visit.   Location of patient (ex: home, work):  Home  Patient consents to a telephone visit:  Yes  Location of the provider (ex: office, home):  Office [place].   Name of any referring provider:  na  Names of all persons participating in the telemedicine service and their role in the encounter:  Maureen Henderson, Patient, Donzell Beal, CMA, Harlene An, NP  Time spent on call:  7:11  "

## 2024-11-09 NOTE — Progress Notes (Signed)
 "   Careteam: Patient Care Team: Caro Harlene POUR, NP as PCP - General (Geriatric Medicine) Rutherford Gain, MD as Consulting Physician (Obstetrics and Gynecology) Ladona Heinz, MD as Consulting Physician (Cardiology) Obie Princella HERO, MD (Inactive) as Consulting Physician (Gastroenterology) Robinson Pao, MD as Consulting Physician (Dermatology) Tommas Pears, MD as Consulting Physician (Endocrinology)  Advanced Directive information Does Patient Have a Medical Advance Directive?: Yes, Type of Advance Directive: Healthcare Power of [place];Living will;Out of facility DNR (pink MOST or yellow form), Does patient want to make changes to medical advance directive?: No - Patient declined  Allergies[1]  Chief Complaint  Patient presents with   Medication Management    Wants to Discuss Weight Loss Medication and Referral to Rheumatologist and PT for balance.      HPI: Patient is a 69 y.o. female via virtual visit Discussed the use of AI scribe software for clinical note transcription with the patient, who gave verbal consent to proceed.  History of Present Illness Maureen Henderson is a 69 year old female who presents with interest in weight loss medication.   Maureen Henderson is interested in discussing weight loss medication, specifically the oral form of semaglutide  (Wegovy ). Maureen Henderson the injectable form (Zepbound ) for two months, which was effective but discontinued due to cost. Maureen Henderson is considering the pill form to aid in weight loss as part of Maureen Henderson new lifestyle regimen, which includes active and balance courses at the [facility]. Maureen Henderson if Maureen Henderson insurance will cover the medication and is considering paying cash with a discount card. In terms of diet, Maureen Henderson primarily consumes meats and vegetables but struggles with reducing bread and starch intake. Maureen Henderson does not currently have a formal diet plan.  Maureen Henderson recent painful outbreaks on Maureen Henderson hands, making it difficult to touch or pick  up objects. Maureen Henderson toes have also been affected, and Maureen Henderson experiencing balance issues. Maureen Henderson was seen at office and  labs were drawn and the ANA was abnormal, and Maureen Henderson was Henderson to rheumatology and is awaiting an appointment. In the meantime, Maureen Henderson plans to see a dermatologist to rule out psoriasis.  Maureen Henderson is experiencing balance issues and is seeking physical therapy. Maureen Henderson to a physical therapist, Maureen Henderson, who specializes in balance and works with senior citizens. Maureen Henderson has taken courses with Maureen Henderson before and is interested in continuing therapy to address Maureen Henderson balance concerns.   Review of Systems:  Review of Systems  Constitutional:  Negative for chills, fever and weight loss.  HENT:  Negative for tinnitus.   Respiratory:  Negative for cough, sputum production and shortness of breath.   Cardiovascular:  Negative for chest pain, palpitations and leg swelling.  Gastrointestinal:  Negative for abdominal pain, constipation, diarrhea and heartburn.  Genitourinary:  Negative for dysuria, frequency and urgency.  Musculoskeletal:  Negative for back pain, falls, joint pain and myalgias.  Skin:  Positive for rash.  Neurological:  Positive for weakness. Negative for dizziness and headaches.  Psychiatric/Behavioral:  Negative for depression and memory loss. The patient does not have insomnia.    Past Medical History:  Diagnosis Date   Adenomatous endometrial hyperplasia    age 54 - hysterectomy    Anxiety    Arthritis    Bundle, branch block, left    Cataract    beginnings of per pt   Depression    GERD (gastroesophageal reflux disease)    Hyperlipidemia    no per pt- no meds taken   Hypertension  Melanoma (HCC) 2012   chest   Osteopenia    Osteopenia    Past Surgical History:  Procedure Laterality Date   COLONOSCOPY     MELANOMA EXCISION     chest   PARTIAL HYSTERECTOMY  30   uterine polyps   POLYPECTOMY     TONSILLECTOMY     Social History:   Henderson  that Maureen Henderson has never smoked. Maureen Henderson has never Henderson smokeless tobacco. Maureen Henderson current alcohol use of about 12.0 standard drinks of alcohol per week. Maureen Henderson that Maureen Henderson does not use drugs.  Family History  Problem Relation Age of Onset   Hypertension Mother    Cancer Father    Prostate cancer Father    Breast cancer Sister    Colon polyps Sister    Stroke Brother    Cancer Maternal Grandmother    Heart disease Paternal Grandmother    Stroke Paternal Grandmother    Cancer Paternal Grandfather    Colon cancer Maternal Aunt    Esophageal cancer Neg Hx    Stomach cancer Neg Hx    Rectal cancer Neg Hx     Medications: Patient's Medications  New Prescriptions   No medications on file  Previous Medications   AMPHETAMINE -DEXTROAMPHETAMINE  (ADDERALL) 5 MG TABLET    Take quarter tablet daily as needed   ASCORBIC ACID (VITA-C PO)    Take 282 mg by mouth 3 (three) times a week.   B COMPLEX VITAMINS CAPSULE    Take 1 capsule by mouth 3 (three) times a week.   BISOPROLOL  (ZEBETA ) 10 MG TABLET    Take 1 tablet (10 mg total) by mouth daily.   CALCIUM CITRATE-VITAMIN D  (CALCIUM CITRATE + PO)    Take 400 mcg by mouth as needed. Sporadically   CHOLECALCIFEROL (VITAMIN D -3) 125 MCG (5000 UT) TABS    Take 1 tablet by mouth 3 (three) times a week.   CYCLOBENZAPRINE  (FLEXERIL ) 5 MG TABLET    Take 5 mg by mouth as needed for muscle spasms.   ESTRADIOL  (ESTRACE ) 0.5 MG TABLET    1/2 tablet three times week.   MULTIPLE VITAMIN (MULTIVITAMIN) TABLET    Take 1 tablet by mouth 3 (three) times a week.   OLMESARTAN  (BENICAR ) 40 MG TABLET    Take 1 tablet (40 mg total) by mouth daily.   VENLAFAXINE  XR (EFFEXOR -XR) 37.5 MG 24 HR CAPSULE    Take 1 capsule (37.5 mg total) by mouth daily with breakfast.   VENLAFAXINE  XR (EFFEXOR -XR) 75 MG 24 HR CAPSULE    Take 1 capsule (75 mg total) by mouth daily.  Modified Medications   No medications on file  Discontinued Medications   No medications on file    Physical  Exam:  There were no vitals filed for this visit. There is no height or weight on file to calculate BMI. Wt Readings from Last 3 Encounters:  10/29/24 190 lb (86.2 kg)  09/01/24 184 lb (83.5 kg)  07/08/24 180 lb (81.6 kg)    Physical Exam Constitutional:      Appearance: Normal appearance.  Pulmonary:     Effort: Pulmonary effort is normal.  Neurological:     Mental Status: Maureen Henderson is alert. Mental status is at baseline.  Psychiatric:        Mood and Affect: Mood normal.     Labs reviewed: Basic Metabolic Panel: Recent Labs    02/02/24 1052 09/01/24 1412  NA 135 135  K 4.4 4.3  CL 99 98  CO2 29 29  GLUCOSE 90 67  BUN 11 17  CREATININE 0.70 0.63  CALCIUM 9.6 10.0   Liver Function Tests: Recent Labs    02/02/24 1052 09/01/24 1412  AST 17 14  ALT 24 24  BILITOT 0.3 0.3  PROT 6.6 7.1   No results for input(s): LIPASE, AMYLASE in the last 8760 hours. No results for input(s): AMMONIA in the last 8760 hours. CBC: Recent Labs    02/02/24 1052 09/01/24 1412  WBC 7.1 8.3  NEUTROABS 4,523 5,495  HGB 15.9* 16.1*  HCT 48.2* 48.2*  MCV 94.5 92.5  PLT 322 360   Lipid Panel: Recent Labs    02/02/24 1052  CHOL 215*  HDL 92  LDLCALC 100*  TRIG 135  CHOLHDL 2.3   TSH: No results for input(s): TSH in the last 8760 hours. A1C: Lab Results  Component Value Date   HGBA1C 5.4 08/07/2022     Assessment/Plan Assessment and Plan Assessment & Plan Class 1 obesity Discussed oral semaglutide  titration and potential insurance issues. Emphasized diet and exercise importance. - Prescribed oral semaglutide  starting at 1.5 mg daily, titrate monthly. - Sent prescription to [facility] and [facility] - Advised Maureen Henderson to check insurance coverage for oral semaglutide . - Instructed Maureen Henderson to schedule follow-up appointment after 30 days if medication is started.  Imbalance Likely related to toe involvement and painful skin eruption. - Henderson to physical  therapy with Maureen Henderson at [facility] for balance issues.  Rash- Painful hand and toe skin eruption, possible autoimmune etiology Positive ANA noted. Differential includes autoimmune etiology. Dermatology and rheumatology evaluations needed. - Proceed with dermatology appointment to evaluate skin eruption. - Henderson to rheumatology for further evaluation of possible autoimmune etiology.    Next appt: 1 month  Beautiful Pensyl K. Caro BODILY  [facility] [phone]    Virtual Visit via video  I connected with patient on 11/09/24 at  9:20 AM EST by mychart and verified that I am speaking with the correct person using two identifiers.  Location: Patient: home Provider: [facility]   I discussed the limitations, risks, security and privacy concerns of performing an evaluation and management service by telephone and the availability of in person appointments. I also discussed with the patient that there may be a patient responsible charge related to this service. The patient expressed understanding and agreed to proceed.   I discussed the assessment and treatment plan with the patient. The patient was provided an opportunity to ask questions and all were answered. The patient agreed with the plan and demonstrated an understanding of the instructions.   The patient was advised to call back or seek an in-person evaluation if the symptoms worsen or if the condition fails to improve as anticipated.  I provided 20 minutes of non-face-to-face time during this encounter.  Arisbel Maione K. Caro, AGNP Avs printed and mailed       [1]  Allergies Allergen Reactions   Doxycycline Hives   "

## 2025-01-05 ENCOUNTER — Ambulatory Visit

## 2025-02-18 ENCOUNTER — Ambulatory Visit: Payer: Self-pay | Admitting: Nurse Practitioner

## 2025-02-25 ENCOUNTER — Ambulatory Visit: Admitting: Nurse Practitioner

## 2025-03-04 ENCOUNTER — Ambulatory Visit: Admitting: Nurse Practitioner
